# Patient Record
Sex: Female | Born: 1960 | Race: White | Hispanic: No | Smoking: Current every day smoker
Health system: Southern US, Community
[De-identification: ages and names within clinical notes are randomized; demographics above are authoritative.]

## PROBLEM LIST (undated history)

## (undated) DIAGNOSIS — I2510 Atherosclerotic heart disease of native coronary artery without angina pectoris: Secondary | ICD-10-CM

## (undated) DIAGNOSIS — A419 Sepsis, unspecified organism: Secondary | ICD-10-CM

## (undated) DIAGNOSIS — R911 Solitary pulmonary nodule: Secondary | ICD-10-CM

## (undated) DIAGNOSIS — E119 Type 2 diabetes mellitus without complications: Secondary | ICD-10-CM

## (undated) DIAGNOSIS — G894 Chronic pain syndrome: Secondary | ICD-10-CM

## (undated) DIAGNOSIS — Z720 Tobacco use: Secondary | ICD-10-CM

## (undated) DIAGNOSIS — M5412 Radiculopathy, cervical region: Secondary | ICD-10-CM

## (undated) DIAGNOSIS — Z9289 Personal history of other medical treatment: Secondary | ICD-10-CM

## (undated) DIAGNOSIS — J449 Chronic obstructive pulmonary disease, unspecified: Secondary | ICD-10-CM

## (undated) DIAGNOSIS — U071 COVID-19: Secondary | ICD-10-CM

## (undated) DIAGNOSIS — K635 Polyp of colon: Secondary | ICD-10-CM

## (undated) HISTORY — PX: CARPAL TUNNEL RELEASE: SHX101

## (undated) HISTORY — PX: DILATION AND CURETTAGE OF UTERUS: SHX78

## (undated) HISTORY — PX: TUBAL LIGATION: SHX77

---

## 2004-04-22 ENCOUNTER — Emergency Department: Payer: Self-pay | Admitting: General Practice

## 2004-06-19 ENCOUNTER — Emergency Department: Payer: Self-pay | Admitting: Emergency Medicine

## 2005-10-04 ENCOUNTER — Emergency Department: Payer: Self-pay | Admitting: Emergency Medicine

## 2006-07-16 ENCOUNTER — Emergency Department: Payer: Self-pay | Admitting: Emergency Medicine

## 2008-03-24 ENCOUNTER — Ambulatory Visit: Payer: Self-pay | Admitting: Family Medicine

## 2008-04-28 ENCOUNTER — Emergency Department: Payer: Self-pay | Admitting: Emergency Medicine

## 2008-12-04 ENCOUNTER — Emergency Department: Payer: Self-pay | Admitting: Emergency Medicine

## 2009-02-18 ENCOUNTER — Ambulatory Visit: Payer: Self-pay

## 2009-08-13 ENCOUNTER — Ambulatory Visit: Payer: Self-pay | Admitting: Unknown Physician Specialty

## 2010-10-04 DIAGNOSIS — M542 Cervicalgia: Secondary | ICD-10-CM | POA: Insufficient documentation

## 2010-10-04 DIAGNOSIS — M545 Low back pain: Secondary | ICD-10-CM | POA: Insufficient documentation

## 2010-10-18 IMAGING — CR DG LUMBAR SPINE AP/LAT/OBLIQUES W/ FLEX AND EXT
1 series · 6 of 6 positions shown · non-contrast
Comparison: none

REASON FOR EXAM: BACK PAIN
COMMENTS:

[Series 1: view not recorded · 0.17mm/px · 6 of 6 slices shown]
[im 1/6]
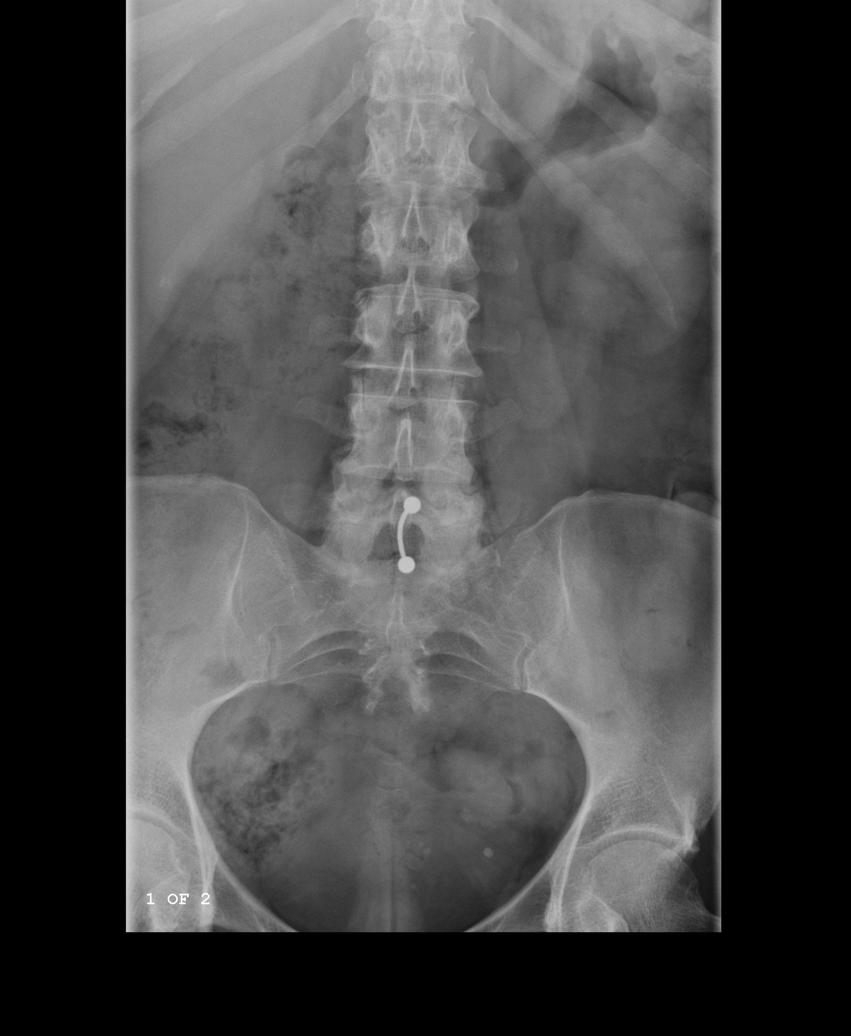
[im 2/6]
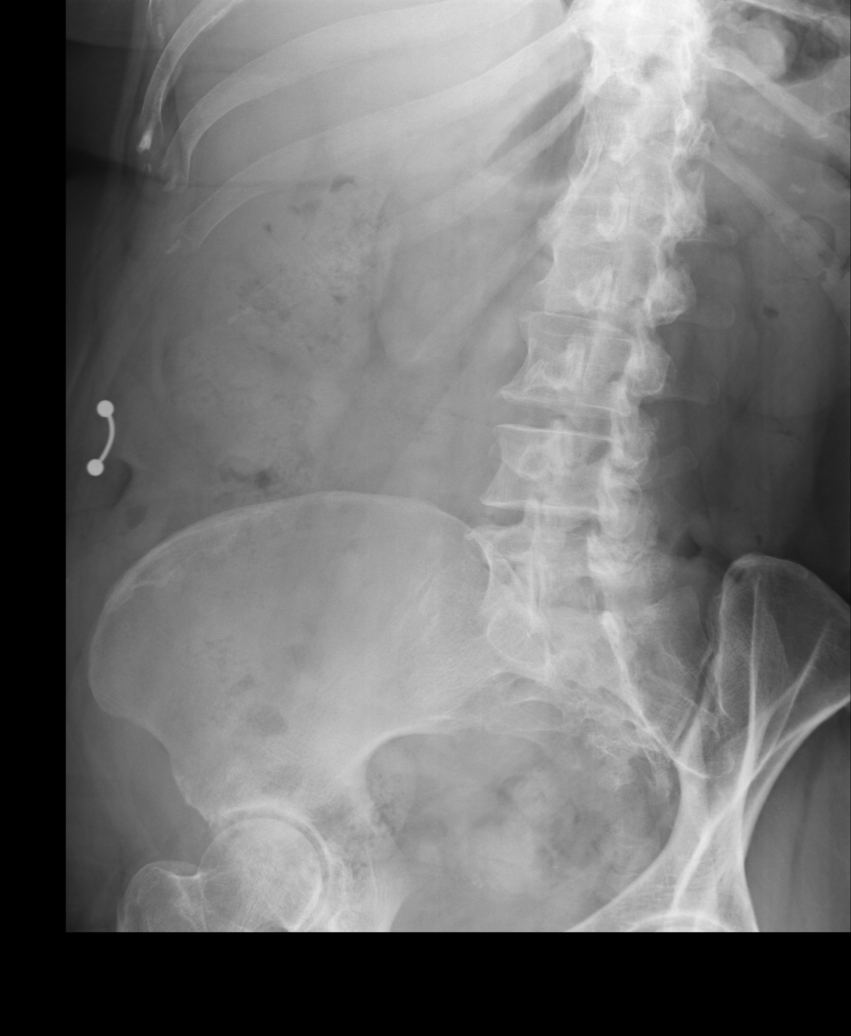
[im 3/6]
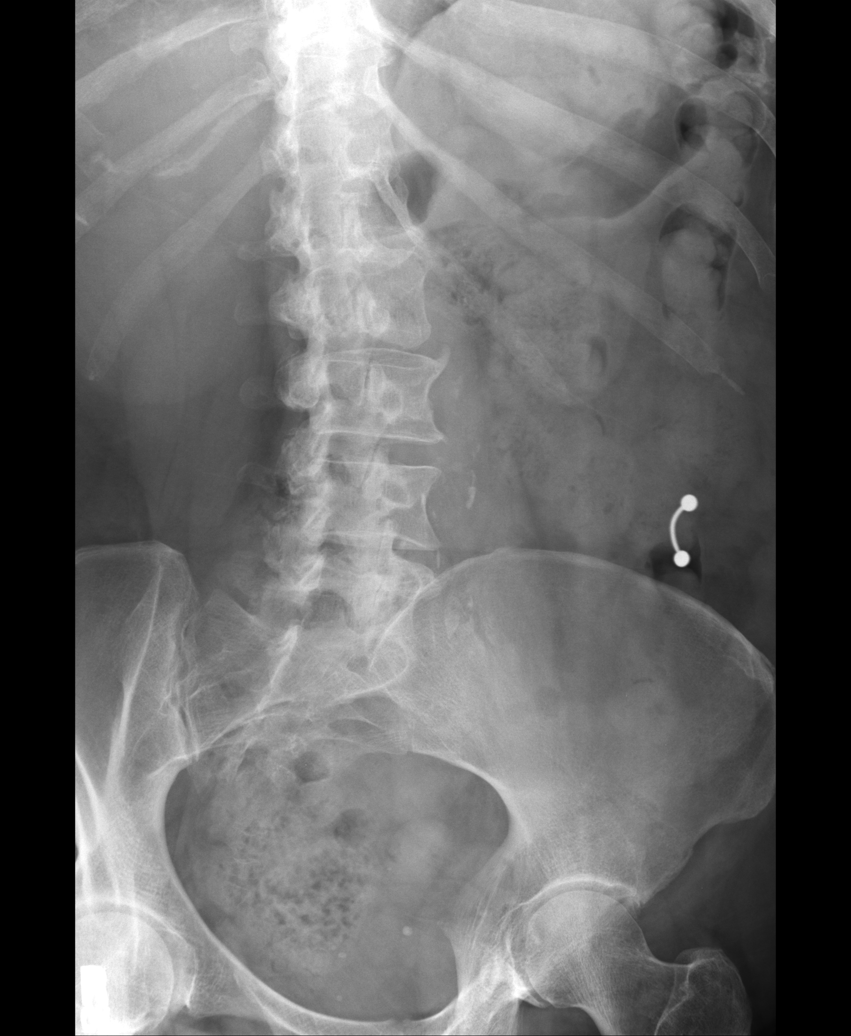
[im 4/6]
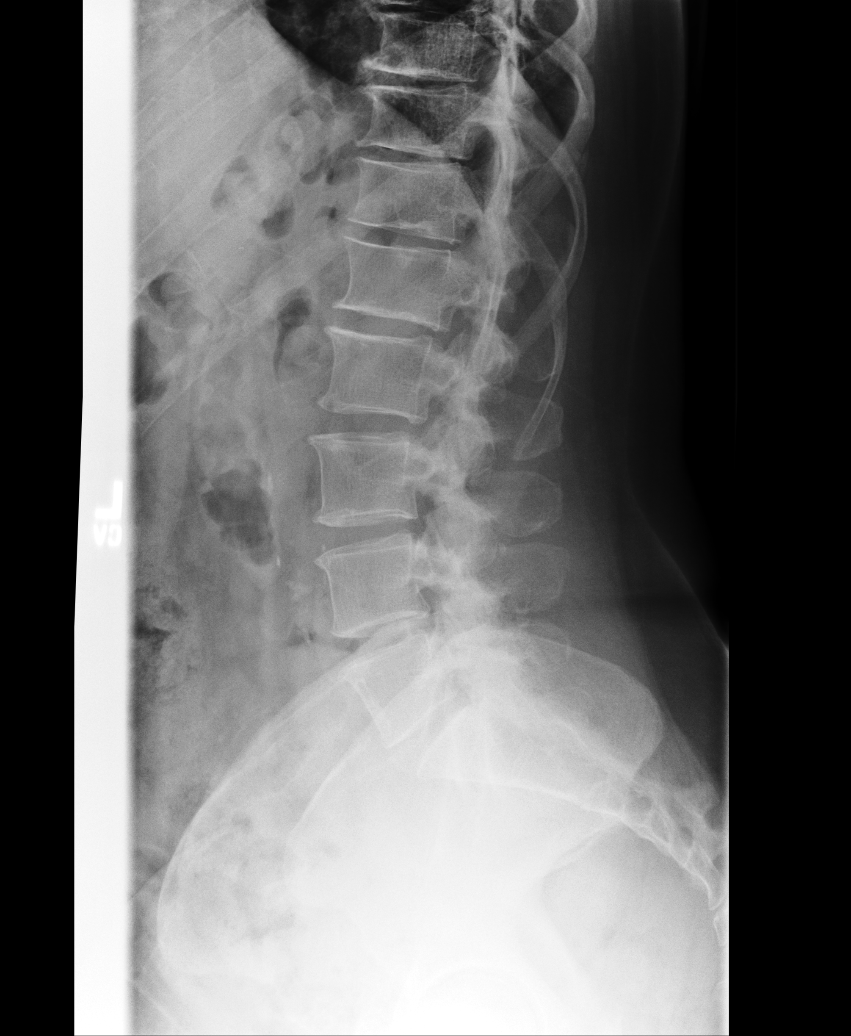
[im 5/6]
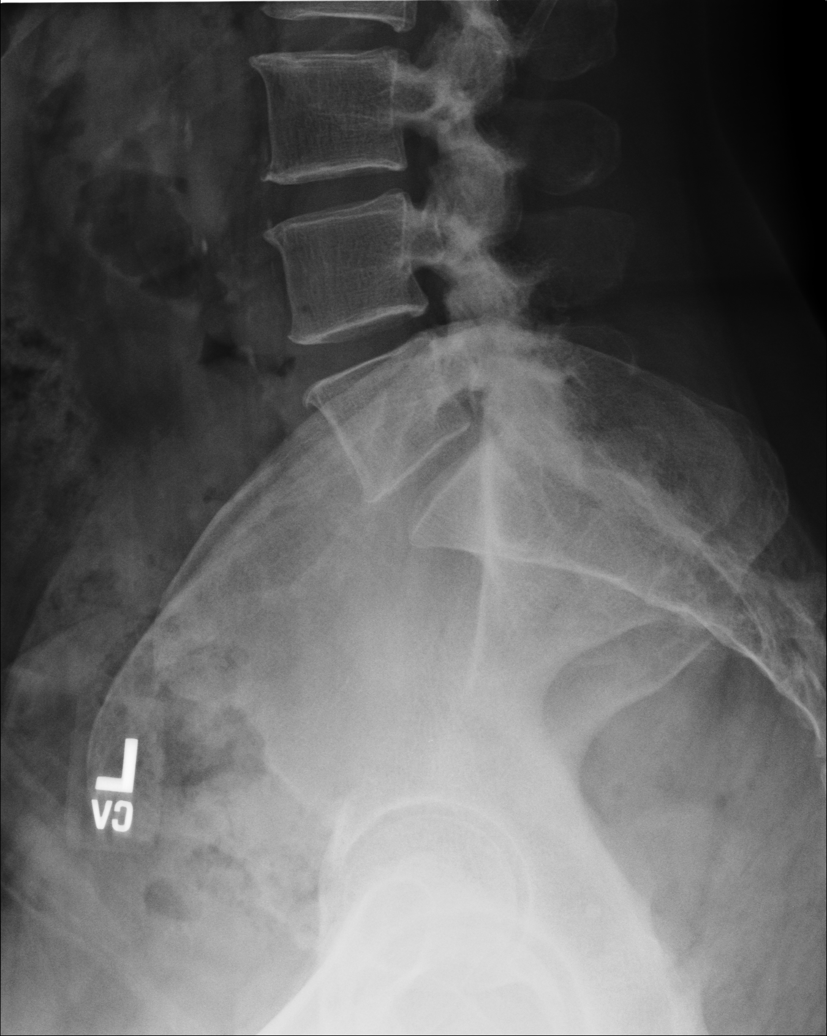
[im 6/6]
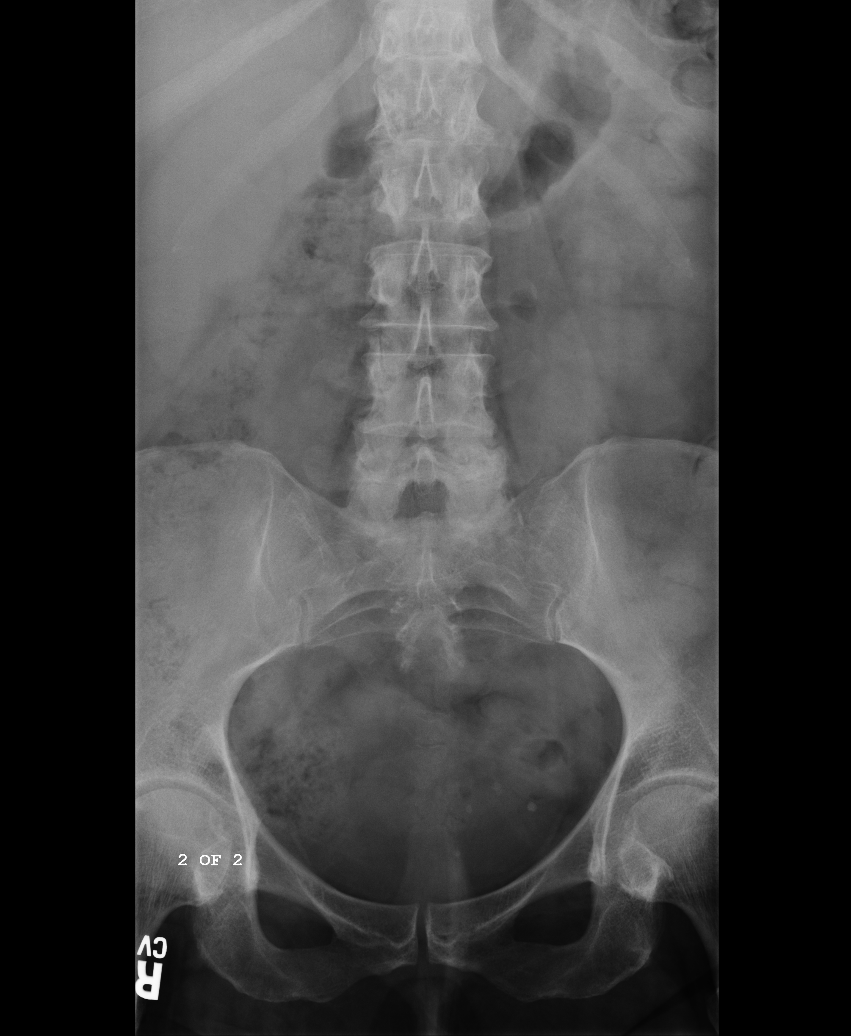

[6 of 6 positions shown; findings below may reference images not displayed]

PROCEDURE:     DXR - DXR LUMBAR SPINE WITH OBLIQUES  - March 24, 2008 [DATE]

RESULT:     There is facet hypertrophy at L4-5 and L5-S1 and to a lesser
extent at L3-4. There is no compression deformity or subluxation.
Atherosclerotic calcification is noted in the aorta. No fracture is
demonstrated. The bowel gas pattern is unremarkable.
IMPRESSION: Degenerative changes in the facets. Consider follow-up MRI
to evaluate for spinal or foraminal stenosis.

## 2012-12-04 DIAGNOSIS — Z79891 Long term (current) use of opiate analgesic: Secondary | ICD-10-CM | POA: Insufficient documentation

## 2012-12-04 DIAGNOSIS — G8929 Other chronic pain: Secondary | ICD-10-CM | POA: Insufficient documentation

## 2016-12-21 DIAGNOSIS — Z0289 Encounter for other administrative examinations: Secondary | ICD-10-CM | POA: Insufficient documentation

## 2017-10-19 ENCOUNTER — Other Ambulatory Visit: Payer: Self-pay | Admitting: Family Medicine

## 2017-10-19 ENCOUNTER — Encounter: Payer: Self-pay | Admitting: Family Medicine

## 2017-10-19 DIAGNOSIS — Z1231 Encounter for screening mammogram for malignant neoplasm of breast: Secondary | ICD-10-CM

## 2017-10-25 ENCOUNTER — Encounter: Payer: Self-pay | Admitting: *Deleted

## 2018-09-17 ENCOUNTER — Other Ambulatory Visit: Payer: Self-pay | Admitting: Pain Medicine

## 2018-09-17 DIAGNOSIS — M542 Cervicalgia: Secondary | ICD-10-CM

## 2018-09-17 DIAGNOSIS — M5412 Radiculopathy, cervical region: Secondary | ICD-10-CM

## 2018-09-28 ENCOUNTER — Other Ambulatory Visit: Payer: Self-pay

## 2018-09-28 ENCOUNTER — Ambulatory Visit
Admission: RE | Admit: 2018-09-28 | Discharge: 2018-09-28 | Disposition: A | Discharge status: Home / Self Care | Payer: Medicare HMO | Source: Ambulatory Visit | Attending: Pain Medicine | Admitting: Pain Medicine

## 2018-09-28 DIAGNOSIS — M5412 Radiculopathy, cervical region: Secondary | ICD-10-CM | POA: Insufficient documentation

## 2018-09-28 DIAGNOSIS — M542 Cervicalgia: Secondary | ICD-10-CM | POA: Insufficient documentation

## 2019-06-04 ENCOUNTER — Other Ambulatory Visit: Payer: Self-pay | Admitting: Family Medicine

## 2019-06-04 DIAGNOSIS — Z1231 Encounter for screening mammogram for malignant neoplasm of breast: Secondary | ICD-10-CM

## 2019-07-29 ENCOUNTER — Encounter: Payer: Self-pay | Admitting: *Deleted

## 2019-09-30 ENCOUNTER — Other Ambulatory Visit: Payer: Self-pay

## 2019-09-30 ENCOUNTER — Encounter: Payer: Self-pay | Admitting: Gastroenterology

## 2019-09-30 ENCOUNTER — Ambulatory Visit (INDEPENDENT_AMBULATORY_CARE_PROVIDER_SITE_OTHER): Payer: Medicare HMO | Admitting: Gastroenterology

## 2019-09-30 DIAGNOSIS — R195 Other fecal abnormalities: Secondary | ICD-10-CM

## 2019-09-30 MED ORDER — NA SULFATE-K SULFATE-MG SULF 17.5-3.13-1.6 GM/177ML PO SOLN: ORAL | 0 refills | Status: DC

## 2019-09-30 NOTE — Progress Notes (Signed)
Laurie Berg [address]  [place]  [place], [place] [address]  Main: [phone]  Fax: [phone]   Gastroenterology Consultation  Referring Provider:     Marguerita Merles, MD Primary Care Physician:  Laurie Merles, MD Reason for Consultation:     Positive stool occult blood test        HPI:    Chief Complaint  Patient presents with  . Occult blood in stool    Laurie Berg is a 59 y.o. y/o female referred for consultation & management  by Dr. Lennox Berg, Laurie Burkitt, MD.  The patient denies abdominal or flank pain, anorexia, nausea or vomiting, dysphagia, change in bowel habits or black or bloody stools or weight loss.  No family history of colon cancer.  No prior colonoscopy.  This is her first stool test for colorectal cancer screening that she has ever had done and was positive.  Past medical history: Low back pain  Prior to Admission medications   Medication Sig Start Date End Date Taking? Authorizing Provider  albuterol (VENTOLIN HFA) 108 (90 Base) MCG/ACT inhaler Inhale 1 puff into the lungs as needed. 09/11/19  Yes [provider]  Aspirin-Caffeine 845-65 MG PACK Take 1 packet by mouth 3 (three) times daily as needed. 01/31/10  Yes [provider]  cetirizine (ZYRTEC) 10 MG tablet Take 10 mg by mouth daily. 09/25/19  Yes [provider]  DULoxetine (CYMBALTA) 60 MG capsule Take 1 capsule by mouth daily. 08/26/19  Yes [provider]  metFORMIN (GLUMETZA) 500 MG (MOD) 24 hr tablet Take 500 mg by mouth daily. 06/24/19  Yes [provider]  methocarbamol (ROBAXIN) 500 MG tablet Take 1 tablet by mouth 3 (three) times daily as needed. 08/26/19  Yes [provider]  naloxegol oxalate (MOVANTIK) 25 MG TABS tablet Take 1 tablet by mouth daily. 06/26/18  Yes [provider]  oxyCODONE 30 MG 12 hr tablet Take 1 tablet by mouth every 12 (twelve) hours. 09/18/19 12/17/19 Yes [provider]   oxyCODONE-acetaminophen (PERCOCET/ROXICET) 5-325 MG tablet Take 1 tablet by mouth every 6 (six) hours as needed. 08/29/19 11/27/19 Yes [provider]  Sennosides 8.6 MG CAPS Take 1 capsule by mouth daily. 04/10/18  Yes [provider]  Na Sulfate-K Sulfate-Mg Sulf 17.5-3.13-1.6 GM/177ML SOLN At 5 PM the day before procedure take 1 bottle and 5 hours before procedure take 1 bottle. 09/30/19   Laurie Manifold, MD    History reviewed. No pertinent family history.   Social History   Tobacco Use  . Smoking status: Current Every Day Smoker    Packs/day: 1.00    Years: 48.00    Pack years: 48.00    Types: Cigarettes  . Smokeless tobacco: Never Used  Substance Use Topics  . Alcohol use: Not Currently  . Drug use: Not on file    Allergies as of 09/30/2019 - Review Complete 09/30/2019  Allergen Reaction Noted  . Pregabalin Shortness Of Breath and Swelling 05/23/2012  . Sulfamethoxazole-trimethoprim Itching 05/23/2012    Review of Systems:    All systems reviewed and negative except where noted in HPI.   Physical Exam:  There were no vitals taken for this visit. No LMP recorded. Psych:  Alert and cooperative. Normal mood and affect. General:   Alert,  Well-developed, well-nourished, pleasant and cooperative in NAD Head:  Normocephalic and atraumatic. Eyes:  Sclera clear, no icterus.   Conjunctiva pink. Ears:  Normal auditory acuity. Nose:  No  deformity, discharge, or lesions. Mouth:  No deformity or lesions,oropharynx pink & moist. Neck:  Supple; no masses or thyromegaly. Abdomen:  Normal bowel sounds.  No bruits.  Soft, non-tender and non-distended without masses, hepatosplenomegaly or hernias noted.  No guarding or rebound tenderness.    Msk:  Symmetrical without gross deformities. Good, equal movement & strength bilaterally. Pulses:  Normal pulses noted. Extremities:  No clubbing or edema.  No cyanosis. Neurologic:  Alert and oriented x3;  grossly normal  neurologically. Skin:  Intact without significant lesions or rashes. No jaundice. Lymph Nodes:  No significant cervical adenopathy. Psych:  Alert and cooperative. Normal mood and affect.   Labs: CBC No results found for: WBC, RBC, HGB, HCT, PLT, MCV, MCH, MCHC, RDW, LYMPHSABS, MONOABS, EOSABS, BASOSABS CMP  No results found for: NA, K, CL, CO2, GLUCOSE, BUN, CREATININE, CALCIUM, PROT, ALBUMIN, AST, ALT, ALKPHOS, BILITOT, GFRNONAA, GFRAA  Imaging Studies: No results found.  Assessment and Plan:   Laurie Berg is a 59 y.o. y/o female has been referred for positive stool occult blood test  Schedule for colonoscopy for rule out colon cancer given positive stool occult blood test  I have discussed alternative options, risks & benefits,  which include, but are not limited to, bleeding, infection, perforation,respiratory complication & drug reaction.  The patient agrees with this plan & written consent will be obtained.       Dr Laurie Berg  Speech recognition software was used to dictate the above note.

## 2019-10-17 ENCOUNTER — Other Ambulatory Visit
Admission: RE | Admit: 2019-10-17 | Discharge: 2019-10-17 | Disposition: A | Discharge status: Home / Self Care | Payer: Medicare HMO | Source: Ambulatory Visit | Attending: Gastroenterology | Admitting: Gastroenterology

## 2019-10-17 ENCOUNTER — Other Ambulatory Visit: Payer: Self-pay

## 2019-10-17 DIAGNOSIS — Z20822 Contact with and (suspected) exposure to covid-19: Secondary | ICD-10-CM | POA: Diagnosis not present

## 2019-10-17 DIAGNOSIS — Z01812 Encounter for preprocedural laboratory examination: Secondary | ICD-10-CM | POA: Insufficient documentation

## 2019-10-17 LAB — SARS CORONAVIRUS 2 (TAT 6-24 HRS): SARS Coronavirus 2: NEGATIVE

## 2019-10-21 ENCOUNTER — Ambulatory Visit: Payer: Medicare HMO | Admitting: Certified Registered"

## 2019-10-21 ENCOUNTER — Encounter: Payer: Self-pay | Admitting: Gastroenterology

## 2019-10-21 ENCOUNTER — Encounter
Admission: RE | Disposition: A | Discharge status: Home / Self Care | Payer: Self-pay | Source: Home / Self Care | Attending: Gastroenterology

## 2019-10-21 ENCOUNTER — Ambulatory Visit
Admission: RE | Admit: 2019-10-21 | Discharge: 2019-10-21 | Disposition: A | Discharge status: Home / Self Care | Payer: Medicare HMO | Attending: Gastroenterology | Admitting: Gastroenterology

## 2019-10-21 ENCOUNTER — Other Ambulatory Visit: Payer: Self-pay

## 2019-10-21 DIAGNOSIS — D124 Benign neoplasm of descending colon: Secondary | ICD-10-CM | POA: Diagnosis not present

## 2019-10-21 DIAGNOSIS — D122 Benign neoplasm of ascending colon: Secondary | ICD-10-CM | POA: Insufficient documentation

## 2019-10-21 DIAGNOSIS — E119 Type 2 diabetes mellitus without complications: Secondary | ICD-10-CM | POA: Insufficient documentation

## 2019-10-21 DIAGNOSIS — F17210 Nicotine dependence, cigarettes, uncomplicated: Secondary | ICD-10-CM | POA: Diagnosis not present

## 2019-10-21 DIAGNOSIS — Z7984 Long term (current) use of oral hypoglycemic drugs: Secondary | ICD-10-CM | POA: Diagnosis not present

## 2019-10-21 DIAGNOSIS — Z79891 Long term (current) use of opiate analgesic: Secondary | ICD-10-CM | POA: Insufficient documentation

## 2019-10-21 DIAGNOSIS — Z888 Allergy status to other drugs, medicaments and biological substances status: Secondary | ICD-10-CM | POA: Diagnosis not present

## 2019-10-21 DIAGNOSIS — K5730 Diverticulosis of large intestine without perforation or abscess without bleeding: Secondary | ICD-10-CM | POA: Insufficient documentation

## 2019-10-21 DIAGNOSIS — D120 Benign neoplasm of cecum: Secondary | ICD-10-CM | POA: Insufficient documentation

## 2019-10-21 DIAGNOSIS — Z882 Allergy status to sulfonamides status: Secondary | ICD-10-CM | POA: Insufficient documentation

## 2019-10-21 DIAGNOSIS — Z79899 Other long term (current) drug therapy: Secondary | ICD-10-CM | POA: Diagnosis not present

## 2019-10-21 DIAGNOSIS — K648 Other hemorrhoids: Secondary | ICD-10-CM | POA: Insufficient documentation

## 2019-10-21 DIAGNOSIS — K635 Polyp of colon: Secondary | ICD-10-CM

## 2019-10-21 DIAGNOSIS — R195 Other fecal abnormalities: Secondary | ICD-10-CM | POA: Diagnosis present

## 2019-10-21 DIAGNOSIS — J449 Chronic obstructive pulmonary disease, unspecified: Secondary | ICD-10-CM | POA: Diagnosis not present

## 2019-10-21 DIAGNOSIS — D125 Benign neoplasm of sigmoid colon: Secondary | ICD-10-CM | POA: Insufficient documentation

## 2019-10-21 HISTORY — DX: Type 2 diabetes mellitus without complications: E11.9

## 2019-10-21 HISTORY — DX: Chronic obstructive pulmonary disease, unspecified: J44.9

## 2019-10-21 HISTORY — PX: COLONOSCOPY WITH PROPOFOL: SHX5780

## 2019-10-21 LAB — GLUCOSE, CAPILLARY: Glucose-Capillary: 131 mg/dL — ABNORMAL HIGH (ref 70–99)

## 2019-10-21 SURGERY — COLONOSCOPY WITH PROPOFOL
Anesthesia: General

## 2019-10-21 MED ORDER — ESMOLOL HCL 100 MG/10ML IV SOLN
INTRAVENOUS | Status: DC | PRN
  Administered 2019-10-21: 10 ug via INTRAVENOUS

## 2019-10-21 MED ORDER — SODIUM CHLORIDE 0.9 % IV SOLN: INTRAVENOUS | Status: DC

## 2019-10-21 MED ORDER — GLYCOPYRROLATE 0.2 MG/ML IJ SOLN
INTRAMUSCULAR | Status: DC | PRN
  Administered 2019-10-21: .2 mg via INTRAVENOUS

## 2019-10-21 MED ORDER — PROPOFOL 10 MG/ML IV BOLUS
INTRAVENOUS | Status: DC | PRN
  Administered 2019-10-21: 10 mg via INTRAVENOUS
  Administered 2019-10-21: 50 mg via INTRAVENOUS

## 2019-10-21 MED ORDER — PROPOFOL 500 MG/50ML IV EMUL
INTRAVENOUS | Status: DC | PRN
  Administered 2019-10-21: 155 ug/kg/min via INTRAVENOUS

## 2019-10-21 MED ORDER — LIDOCAINE HCL (CARDIAC) PF 100 MG/5ML IV SOSY
PREFILLED_SYRINGE | INTRAVENOUS | Status: DC | PRN
  Administered 2019-10-21: 100 mg via INTRAVENOUS

## 2019-10-21 NOTE — H&P (Signed)
Vonda Antigua, MD [address], [place], [place], [place], [address] [address], [place], [place], [place], [address] Phone: [phone]  Fax: [phone]  Primary Care Physician:  Marguerita Merles, MD   Pre-Procedure History & Physical: HPI:  Laurie Berg is a 59 y.o. female is here for a colonoscopy.   Past Medical History:  Diagnosis Date  . COPD (chronic obstructive pulmonary disease) ([place])   . Diabetes mellitus without complication ([place])     History reviewed. No pertinent surgical history.  Prior to Admission medications   Medication Sig Start Date End Date Taking? Authorizing Provider  albuterol (VENTOLIN HFA) 108 (90 Base) MCG/ACT inhaler Inhale 1 puff into the lungs as needed. 09/11/19  Yes [provider]  Aspirin-Caffeine 845-65 MG PACK Take 1 packet by mouth 3 (three) times daily as needed. 01/31/10  Yes [provider]  cetirizine (ZYRTEC) 10 MG tablet Take 10 mg by mouth daily. 09/25/19  Yes [provider]  DULoxetine (CYMBALTA) 60 MG capsule Take 1 capsule by mouth daily. 08/26/19  Yes [provider]  metFORMIN (GLUMETZA) 500 MG (MOD) 24 hr tablet Take 500 mg by mouth daily. 06/24/19  Yes [provider]  methocarbamol (ROBAXIN) 500 MG tablet Take 1 tablet by mouth 3 (three) times daily as needed. 08/26/19  Yes [provider]  naloxegol oxalate (MOVANTIK) 25 MG TABS tablet Take 1 tablet by mouth daily. 06/26/18  Yes [provider]  oxyCODONE 30 MG 12 hr tablet Take 1 tablet by mouth every 12 (twelve) hours. 09/18/19 12/17/19 Yes [provider]  oxyCODONE-acetaminophen (PERCOCET/ROXICET) 5-325 MG tablet Take 1 tablet by mouth every 6 (six) hours as needed. 08/29/19 11/27/19 Yes [provider]  Sennosides 8.6 MG CAPS Take 1 capsule by mouth daily. 04/10/18  Yes [provider]  Na Sulfate-K Sulfate-Mg Sulf 17.5-3.13-1.6 GM/177ML SOLN At 5 PM the day before procedure take 1  bottle and 5 hours before procedure take 1 bottle. 09/30/19   Virgel Manifold, MD    Allergies as of 09/30/2019 - Review Complete 09/30/2019  Allergen Reaction Noted  . Pregabalin Shortness Of Breath and Swelling 05/23/2012  . Sulfamethoxazole-trimethoprim Itching 05/23/2012    History reviewed. No pertinent family history.  Social History   Socioeconomic History  . Marital status: Divorced    Spouse name: Not on file  . Number of children: Not on file  . Years of education: Not on file  . Highest education level: Not on file  Occupational History  . Not on file  Tobacco Use  . Smoking status: Current Every Day Smoker    Packs/day: 1.00    Years: 48.00    Pack years: 48.00    Types: Cigarettes  . Smokeless tobacco: Never Used  Vaping Use  . Vaping Use: Never used  Substance and Sexual Activity  . Alcohol use: Not Currently  . Drug use: Never  . Sexual activity: Not on file  Other Topics Concern  . Not on file  Social History Narrative  . Not on file   Social Determinants of Health   Financial Resource Strain:   . Difficulty of Paying Living Expenses: Not on file  Food Insecurity:   . Worried About Charity fundraiser in the Last Year: Not on file  . Ran Out of Food in the Last Year: Not on file  Transportation Needs:   . Lack of Transportation (Medical): Not on file  . Lack of Transportation (Non-Medical): Not on file  Physical Activity:   . Days of Exercise per Week: Not on file  . Minutes of Exercise per Session: Not on file  Stress:   . Feeling of Stress : Not on file  Social Connections:   . Frequency of Communication with Friends and Family: Not on file  . Frequency of Social Gatherings with Friends and Family: Not on file  . Attends Religious Services: Not on file  . Active Member of Clubs or Organizations: Not on file  . Attends Archivist Meetings: Not on file  . Marital Status: Not on file  Intimate Partner Violence:   . Fear of  Current or Ex-Partner: Not on file  . Emotionally Abused: Not on file  . Physically Abused: Not on file  . Sexually Abused: Not on file    Review of Systems: See HPI, otherwise negative ROS  Physical Exam: BP 110/89   Pulse 100   Temp (!) 96.4 F (35.8 C) (Temporal)   Resp 17   Ht 5\' 4"  (1.626 m)   Wt 80.7 kg   SpO2 94%   BMI 30.55 kg/m  General:   Alert,  pleasant and cooperative in NAD Head:  Normocephalic and atraumatic. Neck:  Supple; no masses or thyromegaly. Lungs:  Clear throughout to auscultation, normal respiratory effort.    Heart:  +S1, +S2, Regular rate and rhythm, No edema. Abdomen:  Soft, nontender and nondistended. Normal bowel sounds, without guarding, and without rebound.   Neurologic:  Alert and  oriented x4;  grossly normal neurologically.  Impression/Plan: Laurie Berg is here for a colonoscopy to be performed for positive stool occult test.  Risks, benefits, limitations, and alternatives regarding  colonoscopy have been reviewed with the patient.  Questions have been answered.  All parties agreeable.   Virgel Manifold, MD  10/21/2019, 10:05 AM

## 2019-10-21 NOTE — Anesthesia Procedure Notes (Signed)
Procedure Name: General with mask airway Performed by: Fletcher-Harrison, Euclide Granito, CRNA Pre-anesthesia Checklist: Patient identified, Emergency Drugs available, Suction available and Patient being monitored Patient Re-evaluated:Patient Re-evaluated prior to induction Oxygen Delivery Method: Simple face mask Induction Type: IV induction Placement Confirmation: positive ETCO2 and CO2 detector Dental Injury: Teeth and Oropharynx as per pre-operative assessment        

## 2019-10-21 NOTE — Anesthesia Preprocedure Evaluation (Signed)
Anesthesia Evaluation  Patient identified by MRN, date of birth, ID band Patient awake    Reviewed: Allergy & Precautions, NPO status , Patient's Chart, lab work & pertinent test results  History of Anesthesia Complications Negative for: history of anesthetic complications  Airway Mallampati: III       Dental   Pulmonary neg sleep apnea, COPD,  COPD inhaler, Current Smoker and Patient abstained from smoking.,           Cardiovascular (-) hypertension(-) Past MI and (-) CHF (-) dysrhythmias (-) Valvular Problems/Murmurs     Neuro/Psych neg Seizures    GI/Hepatic Neg liver ROS, neg GERD  ,  Endo/Other  diabetes, Type 2, Oral Hypoglycemic Agents  Renal/GU negative Renal ROS     Musculoskeletal   Abdominal   Peds  Hematology   Anesthesia Other Findings   Reproductive/Obstetrics                             Anesthesia Physical Anesthesia Plan  ASA: III  Anesthesia Plan: General   Post-op Pain Management:    Induction: Intravenous  PONV Risk Score and Plan: 2 and Propofol infusion and TIVA  Airway Management Planned: Nasal Cannula  Additional Equipment:   Intra-op Plan:   Post-operative Plan:   Informed Consent: I have reviewed the patients History and Physical, chart, labs and discussed the procedure including the risks, benefits and alternatives for the proposed anesthesia with the patient or authorized representative who has indicated his/her understanding and acceptance.       Plan Discussed with:   Anesthesia Plan Comments:         Anesthesia Quick Evaluation

## 2019-10-21 NOTE — Anesthesia Postprocedure Evaluation (Signed)
Anesthesia Post Note  Patient: Laurie Berg  Procedure(s) Performed: COLONOSCOPY WITH PROPOFOL (N/A )  Patient location during evaluation: Endoscopy Anesthesia Type: General Level of consciousness: awake and alert Pain management: pain level controlled Vital Signs Assessment: post-procedure vital signs reviewed and stable Respiratory status: spontaneous breathing and respiratory function stable Cardiovascular status: stable Anesthetic complications: no   No complications documented.   Last Vitals:  Vitals:   10/21/19 0944 10/21/19 1124  BP: 110/89 (!) 173/93  Pulse: 100   Resp: 17   Temp: (!) 35.8 C   SpO2: 94%     Last Pain:  Vitals:   10/21/19 1124  TempSrc:   PainSc: 0-No pain                 Cherylene Ferrufino K

## 2019-10-21 NOTE — Transfer of Care (Signed)
Immediate Anesthesia Transfer of Care Note  Patient: Laurie Berg  Procedure(s) Performed: COLONOSCOPY WITH PROPOFOL (N/A )  Patient Location: Endoscopy Unit  Anesthesia Type:General  Level of Consciousness: drowsy and patient cooperative  Airway & Oxygen Therapy: Patient Spontanous Breathing and Patient connected to face mask oxygen  Post-op Assessment: Report given to RN and Post -op Vital signs reviewed and stable  Post vital signs: Reviewed and stable  Last Vitals:  Vitals Value Taken Time  BP 131/98 10/21/19 1104  Temp    Pulse 102 10/21/19 1105  Resp 17 10/21/19 1105  SpO2 99 % 10/21/19 1105  Vitals shown include unvalidated device data.  Last Pain:  Vitals:   10/21/19 1104  TempSrc:   PainSc: 0-No pain         Complications: No complications documented.

## 2019-10-21 NOTE — Op Note (Signed)
[facility] Patient Name: Laurie Berg Procedure Date: 10/21/2019 10:04 AM MRN: [id number] Account #: [id number] Date of Birth: September 27, 1960 Admit Type: Outpatient Age: 59 Room: [facility] ENDO ROOM 1 Gender: Female Note Status: Finalized Procedure:             Colonoscopy Indications:           Gastrointestinal occult blood loss Providers:             Lannette Avellino B. Bonna Gains MD, MD Referring MD:          Marguerita Merles, MD (Referring MD) Medicines:             Monitored Anesthesia Care Complications:         No immediate complications. Procedure:             Pre-Anesthesia Assessment:                        - ASA Grade Assessment: II - A patient with mild                         systemic disease.                        - Prior to the procedure, a History and Physical was                         performed, and patient medications, allergies and                         sensitivities were reviewed. The patient's tolerance                         of previous anesthesia was reviewed.                        - The risks and benefits of the procedure and the                         sedation options and risks were discussed with the                         patient. All questions were answered and informed                         consent was obtained.                        - Patient identification and proposed procedure were                         verified prior to the procedure by the physician, the                         nurse, the anesthesiologist, the anesthetist and the                         technician. The procedure was verified in the                         procedure room.  After obtaining informed consent, the colonoscope was                         passed under direct vision. Throughout the procedure,                         the patient's blood pressure, pulse, and oxygen                         saturations were monitored  continuously. The                         Colonoscope was introduced through the anus and                         advanced to the the cecum, identified by appendiceal                         orifice and ileocecal valve. The colonoscopy was                         performed with ease. The patient tolerated the                         procedure well. The quality of the bowel preparation                         was good. Findings:      Hemorrhoids were found on perianal exam.      Three sessile polyps were found in the ascending colon and cecum. The       polyps were 5 to 8 mm in size. These polyps were removed with a cold       snare. Resection and retrieval were complete.      A 4 mm polyp was found in the ascending colon. The polyp was sessile.       The polyp was removed with a cold biopsy forceps. Resection and       retrieval were complete.      An area of mildly thickened folds of the mucosa was found at the       ileocecal valve. Biopsies were taken with a cold forceps for histology.      Three sessile polyps were found in the descending colon. The polyps were       5 to 7 mm in size. These polyps were removed with a cold snare.       Resection and retrieval were complete.      A 12 mm polyp was found in the sigmoid colon. The polyp was       semi-pedunculated. The polyp was removed with a hot snare. Resection and       retrieval were complete.      Multiple diverticula were found in the sigmoid colon.      The exam was otherwise without abnormality.      The rectum, sigmoid colon, descending colon, transverse colon, ascending       colon and cecum appeared normal.      Non-bleeding internal hemorrhoids were found during retroflexion. The       hemorrhoids were medium-sized. Impression:            -  Hemorrhoids found on perianal exam.                        - Three 5 to 8 mm polyps in the ascending colon and in                         the cecum, removed with a cold snare. Resected  and                         retrieved.                        - One 4 mm polyp in the ascending colon, removed with                         a cold biopsy forceps. Resected and retrieved.                        - Thickened folds of the mucosa at the ileocecal                         valve. Biopsied.                        - Three 5 to 7 mm polyps in the descending colon,                         removed with a cold snare. Resected and retrieved.                        - One 12 mm polyp in the sigmoid colon, removed with a                         hot snare. Resected and retrieved.                        - Diverticulosis in the sigmoid colon.                        - The examination was otherwise normal.                        - The rectum, sigmoid colon, descending colon,                         transverse colon, ascending colon and cecum are normal.                        - Non-bleeding internal hemorrhoids. Recommendation:        - Discharge patient to home (with escort).                        - High fiber diet.                        - Advance diet as tolerated.                        - Continue present medications.                        -  Await pathology results.                        - Repeat colonoscopy date to be determined after                         pending pathology results are reviewed.                        - The findings and recommendations were discussed with                         the patient.                        - The findings and recommendations were discussed with                         the patient's family.                        - Return to primary care physician as previously                         scheduled.                        - High fiber diet. Procedure Code(s):     --- Professional ---                        [phone], Colonoscopy, flexible; with removal of                         tumor(s), polyp(s), or other lesion(s) by snare                          technique                        45380, 64, Colonoscopy, flexible; with biopsy, single                         or multiple Diagnosis Code(s):     --- Professional ---                        K63.5, Polyp of colon                        R19.5, Other fecal abnormalities CPT copyright 2019 American Medical Association. All rights reserved. The codes documented in this report are preliminary and upon coder review may  be revised to meet current compliance requirements.  Vonda Antigua, MD Margretta Sidle B. Bonna Gains MD, MD 10/21/2019 11:07:01 AM This report has been signed electronically. Number of Addenda: 0 Note Initiated On: 10/21/2019 10:04 AM Scope Withdrawal Time: 0 hours 32 minutes 2 seconds  Estimated Blood Loss:  Estimated blood loss: none.      [facility]

## 2019-10-22 ENCOUNTER — Encounter: Payer: Self-pay | Admitting: Gastroenterology

## 2019-10-22 LAB — SURGICAL PATHOLOGY

## 2019-10-23 ENCOUNTER — Encounter: Payer: Self-pay | Admitting: Gastroenterology

## 2019-10-31 ENCOUNTER — Telehealth: Payer: Self-pay

## 2019-10-31 NOTE — Telephone Encounter (Signed)
Patient called requesting for her colonoscopy results. I then called her back and had to leave her a detailed message letting her know that the polyps that were removed were pre-cancerous, therefore, Dr. Bonna Gains would want to repeat her colonoscopy in a year and that we would put her on our recall list to contact her.

## 2021-04-11 ENCOUNTER — Other Ambulatory Visit: Payer: Self-pay

## 2021-04-11 ENCOUNTER — Telehealth: Payer: Self-pay | Admitting: Family Medicine

## 2021-04-11 NOTE — Telephone Encounter (Signed)
Requesting a call back from [facility]

## 2021-04-12 ENCOUNTER — Telehealth: Payer: Self-pay

## 2021-04-12 NOTE — Telephone Encounter (Signed)
CALLED PATIENT NO ANSWER LEFT VOICEMAIL FOR A CALL BACK ? ?

## 2021-04-12 NOTE — Progress Notes (Signed)
Patient would like to schedule procedure at a later time. Will contact office.

## 2021-04-18 ENCOUNTER — Other Ambulatory Visit: Payer: Self-pay | Admitting: *Deleted

## 2021-04-18 DIAGNOSIS — Z87891 Personal history of nicotine dependence: Secondary | ICD-10-CM

## 2021-04-18 DIAGNOSIS — F17210 Nicotine dependence, cigarettes, uncomplicated: Secondary | ICD-10-CM

## 2021-04-27 ENCOUNTER — Ambulatory Visit (INDEPENDENT_AMBULATORY_CARE_PROVIDER_SITE_OTHER): Payer: Medicare HMO | Admitting: Acute Care

## 2021-04-27 ENCOUNTER — Encounter: Payer: Self-pay | Admitting: Acute Care

## 2021-04-27 ENCOUNTER — Other Ambulatory Visit: Payer: Self-pay

## 2021-04-27 DIAGNOSIS — F17210 Nicotine dependence, cigarettes, uncomplicated: Secondary | ICD-10-CM | POA: Diagnosis not present

## 2021-04-27 NOTE — Patient Instructions (Signed)
Thank you for participating in the [place] [facility] Screening Program. °It was our pleasure to meet you today. °We will call you with the results of your scan within the next few days. °Your scan will be assigned a Lung RADS category score by the physicians reading the scans.  °This Lung RADS score determines follow up scanning.  °See below for description of categories, and follow up screening recommendations. °We will be in touch to schedule your follow up screening annually or based on recommendations of our providers. °We will fax a copy of your scan results to your Primary Care Physician, or the physician who referred you to the program, to ensure they have the results. °Please call the office if you have any questions or concerns regarding your scanning experience or results.  °Our office number is [phone]. °Please speak with Denise Phelps, RN. She is our Lung Cancer Screening RN. °If she is unavailable when you call, please have the office staff send her a message. She will return your call at her earliest convenience. °Remember, if your scan is normal, we will scan you annually as long as you continue to meet the criteria for the program. (Age 55-77, Current smoker or smoker who has quit within the last 15 years). °If you are a smoker, remember, quitting is the single most powerful action that you can take to decrease your risk of lung cancer and other pulmonary, breathing related problems. °We know quitting is hard, and we are here to help.  °Please let us know if there is anything we can do to help you meet your goal of quitting. °If you are a former smoker, congratulations. We are proud of you! Remain smoke free! °Remember you can refer friends or family members through the number above.  °We will screen them to make sure they meet criteria for the program. °Thank you for helping us take better care of you by participating in Lung Screening. ° °You can receive free nicotine replacement therapy  ( patches, gum or mints) by calling 1-800-QUIT NOW. Please call so we can get you on the path to becoming  a non-smoker. I know it is hard, but you can do this! ° °Lung RADS Categories: ° °Lung RADS 1: no nodules or definitely non-concerning nodules.  °Recommendation is for a repeat annual scan in 12 months. ° °Lung RADS 2:  nodules that are non-concerning in appearance and behavior with a very low likelihood of becoming an active cancer. °Recommendation is for a repeat annual scan in 12 months. ° °Lung RADS 3: nodules that are probably non-concerning , includes nodules with a low likelihood of becoming an active cancer.  Recommendation is for a 6-month repeat screening scan. Often noted after an upper respiratory illness. We will be in touch to make sure you have no questions, and to schedule your 6-month scan. ° °Lung RADS 4 A: nodules with concerning findings, recommendation is most often for a follow up scan in 3 months or additional testing based on our provider's assessment of the scan. We will be in touch to make sure you have no questions and to schedule the recommended 3 month follow up scan. ° °Lung RADS 4 B:  indicates findings that are concerning. We will be in touch with you to schedule additional diagnostic testing based on our provider's  assessment of the scan. ° °Hypnosis for smoking cessation  °[employer] °[phone] ° °Acupuncture for smoking cessation  °[facility] °[phone]  °

## 2021-04-27 NOTE — Progress Notes (Signed)
Virtual Visit via Video Note ? ?I connected with Laurie Berg on 04/27/21 at 11:00 AM EDT by a video enabled telemedicine application and verified that I am speaking with the correct person using two identifiers. ? ?Location: ?Patient:  At home ?Provider: [place], [place], [place], [address]  ?  ?I discussed the limitations of evaluation and management by telemedicine and the availability of in person appointments. The patient expressed understanding and agreed to proceed. ? ? ?Shared Decision Making Visit Lung Cancer Screening Program ?([phone]) ? ? ?Eligibility: ?Age 61 y.o. ?Pack Years Smoking History Calculation 60 pack year smoking history ?(# packs/per year x # years smoked) ?Recent History of coughing up blood  no ?Unexplained weight loss? no ?( >Than 15 pounds within the last 6 months ) ?Prior History Lung / other cancer no ?(Diagnosis within the last 5 years already requiring surveillance chest CT Scans). ?Smoking Status Current Smoker ?Former Smokers: Years since quit:  NA ? Quit Date:  NA ? ?Visit Components: ?Discussion included one or more decision making aids. yes ?Discussion included risk/benefits of screening. yes ?Discussion included potential follow up diagnostic testing for abnormal scans. yes ?Discussion included meaning and risk of over diagnosis. yes ?Discussion included meaning and risk of False Positives. yes ?Discussion included meaning of total radiation exposure. yes ? ?Counseling Included: ?Importance of adherence to annual lung cancer LDCT screening. yes ?Impact of comorbidities on ability to participate in the program. yes ?Ability and willingness to under diagnostic treatment. yes ? ?Smoking Cessation Counseling: ?Current Smokers:  ?Discussed importance of smoking cessation. yes ?Information about tobacco cessation classes and interventions provided to patient. yes ?Patient provided with "ticket" for LDCT Scan. yes ?Symptomatic Patient. no ? Counseling NA ?Diagnosis Code:  Tobacco Use Z72.0 ?Asymptomatic Patient yes ? Counseling (Intermediate counseling: > three minutes counseling) L2440 ?Former Smokers:  ?Discussed the importance of maintaining cigarette abstinence. yes ?Diagnosis Code: Personal History of Nicotine Dependence. N02.725 ?Information about tobacco cessation classes and interventions provided to patient. Yes ?Patient provided with "ticket" for LDCT Scan. yes ?Written Order for Lung Cancer Screening with LDCT placed in Epic. Yes ?(CT Chest Lung Cancer Screening Low Dose W/O CM) [id number] ?Z12.2-Screening of respiratory organs ?Z87.891-Personal history of nicotine dependence ? ?I have spent 25 minutes of face to face/ virtual visit   time with  Laurie Berg  discussing the risks and benefits of lung cancer screening. We viewed / discussed a power point together that explained in detail the above noted topics. We paused at intervals to allow for questions to be asked and answered to ensure understanding.We discussed that the single most powerful action that she can take to decrease her risk of developing lung cancer is to quit smoking. We discussed whether or not she is ready to commit to setting a quit date. We discussed options for tools to aid in quitting smoking including nicotine replacement therapy, non-nicotine medications, support groups, Quit Smart classes, and behavior modification. We discussed that often times setting smaller, more achievable goals, such as eliminating 1 cigarette a day for a week and then 2 cigarettes a day for a week can be helpful in slowly decreasing the number of cigarettes smoked. This allows for a sense of accomplishment as well as providing a clinical benefit. I provided  her  with smoking cessation  information  with contact information for community resources, classes, free nicotine replacement therapy, and access to mobile apps, text messaging, and on-line smoking cessation help. I have also provided  her  the office contact information  in the event she needs to contact me, or the screening staff. We discussed the time and location of the scan, and that either Doroteo Glassman RN, Joella Prince, RN  or I will call / send a letter with the results within 24-72 hours of receiving them. The patient verbalized understanding of all of  the above and had no further questions upon leaving the office. They have my contact information in the event they have any further questions. ? ?I spent 3 minutes counseling on smoking cessation and the health risks of continued tobacco abuse. ? ?I explained to the patient that there has been a high incidence of coronary artery disease noted on these exams. I explained that this is a non-gated exam therefore degree or severity cannot be determined. This patient is not on statin therapy. I have asked the patient to follow-up with their PCP regarding any incidental finding of coronary artery disease and management with diet or medication as their PCP  feels is clinically indicated. The patient verbalized understanding of the above and had no further questions upon completion of the visit. ? ?  ? ? ?Magdalen Spatz, NP ? ?04/27/2021 ? ? ? ? ? ?

## 2021-04-28 ENCOUNTER — Other Ambulatory Visit: Payer: Self-pay

## 2021-04-28 ENCOUNTER — Ambulatory Visit
Admission: RE | Admit: 2021-04-28 | Discharge: 2021-04-28 | Disposition: A | Discharge status: Home / Self Care | Payer: Medicare HMO | Source: Ambulatory Visit | Attending: Acute Care | Admitting: Acute Care

## 2021-04-28 DIAGNOSIS — Z87891 Personal history of nicotine dependence: Secondary | ICD-10-CM | POA: Insufficient documentation

## 2021-04-28 DIAGNOSIS — F17210 Nicotine dependence, cigarettes, uncomplicated: Secondary | ICD-10-CM | POA: Diagnosis not present

## 2021-05-02 ENCOUNTER — Other Ambulatory Visit: Payer: Self-pay

## 2021-05-02 DIAGNOSIS — F17210 Nicotine dependence, cigarettes, uncomplicated: Secondary | ICD-10-CM

## 2021-05-02 DIAGNOSIS — Z87891 Personal history of nicotine dependence: Secondary | ICD-10-CM

## 2021-05-05 ENCOUNTER — Telehealth: Payer: Self-pay | Admitting: Acute Care

## 2021-05-05 NOTE — Telephone Encounter (Signed)
Please call patient and let her know her scan was read as a Lung RADS 2: nodules that are benign in appearance and behavior with a very low likelihood of becoming a clinically active cancer due to size or lack of growth. Recommendation per radiology is for a repeat LDCT in 12 months.  ?Please let her know there was notation of adrenal adenomas . These are non-cancerous tumors that form within the adrenal gland, which is the hormone producing gland located on top of each kidney.  Symptomatic Adrenal adenomas can cause increased levels of fat in the torso, obesity, fatigue, muscle weakness , Hypertension, diabetes and bruising. Not all adenomas cause symptoms. Please follow up with PCP for further evaluation as they feel is clinically indicated.  ? ?Langley Gauss, please fax results to PCP and let them know about the finding of the adrenal adenomas so they can follow up as the feel is clinically indicated. ?Thanks so much ?

## 2021-05-05 NOTE — Telephone Encounter (Signed)
Spoke with patient to review LDCT results.  Advised of no suspicious nodules for lung cancer, atherosclerosis and emphysema are noted.   ?

## 2021-05-05 NOTE — Telephone Encounter (Signed)
Unable to reach patient by phone to review LCS results.  Left voicemail with call back number.   ?Results of recent CT chest faxed to PCP for review. ?

## 2021-05-05 NOTE — Telephone Encounter (Signed)
Results of LDCT continued - also noted were adrenal adenomas.  Discussed per Eric Form, NP notes.  Patient acknowledged understanding . Will place order for annual LDCT and route report to PCP for review.  Patient had no further questions ?

## 2021-07-20 ENCOUNTER — Telehealth: Payer: Self-pay

## 2021-07-20 NOTE — Telephone Encounter (Signed)
Patient is following up. She states she has never seen a cardiologist in the past.

## 2021-07-20 NOTE — Telephone Encounter (Signed)
Left message on voicemail requesting to have patient call back if she has any previous cardiac hx so we could get any pertinent records prior to upcoming appointment with Dr. Garen Lah on 07/22/2021.

## 2021-07-22 ENCOUNTER — Encounter: Payer: Self-pay | Admitting: Cardiology

## 2021-07-22 ENCOUNTER — Ambulatory Visit (INDEPENDENT_AMBULATORY_CARE_PROVIDER_SITE_OTHER): Payer: Medicare HMO | Admitting: Cardiology

## 2021-07-22 VITALS — BP 130/84 | HR 109 | Ht 64.0 in | Wt 175.0 lb

## 2021-07-22 DIAGNOSIS — R0609 Other forms of dyspnea: Secondary | ICD-10-CM

## 2021-07-22 DIAGNOSIS — Z13220 Encounter for screening for lipoid disorders: Secondary | ICD-10-CM | POA: Diagnosis not present

## 2021-07-22 DIAGNOSIS — I2510 Atherosclerotic heart disease of native coronary artery without angina pectoris: Secondary | ICD-10-CM

## 2021-07-22 DIAGNOSIS — F17200 Nicotine dependence, unspecified, uncomplicated: Secondary | ICD-10-CM | POA: Diagnosis not present

## 2021-07-22 NOTE — Progress Notes (Signed)
Cardiology Office Note:    Date:  07/22/2021   ID:  Laurie Berg, DOB [birth year], MRN [id number]  PCP:  Marguerita Merles, MD   [facility] Cardiologist:  Kate Sable, MD     Referring MD: Marguerita Merles, MD   Chief Complaint  Patient presents with   NEW patient-Referred by PCP for eval of CAD    Patient reports DOE, intermittent chest pain, and palpitations.    History of Present Illness:    Laurie Berg is a 61 y.o. female with a hx of diabetes, current smoker x40+ years, COPD who presents due to coronary artery calcifications, shortness of breath with exertion and chest pain.   She denies shortness of breath with minimal exertion which she attributes to COPD. Chest CT lung cancer screening was obtained 04/28/2021, three-vessel coronary artery calcification was noted.  Endorses occasional chest pain not associated with exertion.  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  She still smokes.  Past Medical History:  Diagnosis Date   COPD (chronic obstructive pulmonary disease) ([place])    Diabetes mellitus without complication ([place])     Past Surgical History:  Procedure Laterality Date   CARPAL TUNNEL RELEASE     COLONOSCOPY WITH PROPOFOL N/A 10/21/2019   Procedure: COLONOSCOPY WITH PROPOFOL;  Surgeon: Virgel Manifold, MD;  Location: [facility] ENDOSCOPY;  Service: Endoscopy;  Laterality: N/A;   DILATION AND CURETTAGE OF UTERUS     TUBAL LIGATION      Current Medications: Current Meds  Medication Sig   albuterol (VENTOLIN HFA) 108 (90 Base) MCG/ACT inhaler Inhale 1 puff into the lungs as needed.   Aspirin-Salicylamide-Caffeine (BC HEADACHE POWDER PO) Take by mouth as needed.   baclofen (LIORESAL) 10 MG tablet Take 10-20 mg by mouth at bedtime as needed.   cetirizine (ZYRTEC) 10 MG tablet Take 10 mg by mouth daily.   DULoxetine (CYMBALTA) 30 MG capsule Take 90 mg by mouth daily.   LINZESS 72 MCG capsule Take 72 mcg by  mouth every morning.   metFORMIN (GLUMETZA) 500 MG (MOD) 24 hr tablet Take 500 mg by mouth daily.   oxyCODONE-acetaminophen (PERCOCET/ROXICET) 5-325 MG tablet Take 1 tablet by mouth every 6 (six) hours as needed.   OXYCONTIN 30 MG 12 hr tablet Take 1 tablet by mouth 2 (two) times daily.   predniSONE (DELTASONE) 20 MG tablet Take 10 mg by mouth daily.   TRELEGY ELLIPTA 100-62.5-25 MCG/ACT AEPB Take 1 puff by mouth daily.     Allergies:   Pregabalin and Sulfamethoxazole-trimethoprim   Social History   Socioeconomic History   Marital status: Divorced    Spouse name: Not on file   Number of children: Not on file   Years of education: Not on file   Highest education level: Not on file  Occupational History   Not on file  Tobacco Use   Smoking status: Every Day    Packs/day: 1.75    Years: 48.00    Total pack years: 84.00    Types: Cigarettes   Smokeless tobacco: Never  Vaping Use   Vaping Use: Never used  Substance and Sexual Activity   Alcohol use: Not Currently   Drug use: Never   Sexual activity: Not on file  Other Topics Concern   Not on file  Social History Narrative   Not on file   Social Determinants of Health   Financial Resource Strain: Not on file  Food Insecurity: Not  on file  Transportation Needs: Not on file  Physical Activity: Not on file  Stress: Not on file  Social Connections: Not on file     Family History: The patient's family history is not on file.  ROS:   Please see the history of present illness.     All other systems reviewed and are negative.  EKGs/Labs/Other Studies Reviewed:    The following studies were reviewed today:   EKG:  EKG is  ordered today.  The ekg ordered today demonstrates sinus tachycardia, otherwise normal, heart rate 100  Recent Labs: No results found for requested labs within last 365 days.  Recent Lipid Panel No results found for: "CHOL", "TRIG", "HDL", "CHOLHDL", "VLDL", "LDLCALC", "LDLDIRECT"   Risk  Assessment/Calculations:         Physical Exam:    VS:  BP 130/84 (BP Location: Right Arm, Patient Position: Sitting, Cuff Size: Normal)   Pulse (!) 109   Ht '5\' 4"'$  (1.626 m)   Wt 175 lb (79.4 kg)   SpO2 93%   BMI 30.04 kg/m     Wt Readings from Last 3 Encounters:  07/22/21 175 lb (79.4 kg)  10/21/19 178 lb (80.7 kg)     GEN:  Well nourished, well developed in no acute distress HEENT: Normal NECK: No JVD; No carotid bruits LYMPHATICS: No lymphadenopathy CARDIAC: RRR, no murmurs, rubs, gallops RESPIRATORY: Diminished breath sounds, no wheezing. ABDOMEN: Soft, non-tender, non-distended MUSCULOSKELETAL:  No edema; No deformity  SKIN: Warm and dry NEUROLOGIC:  Alert and oriented x 3 PSYCHIATRIC:  Normal affect   ASSESSMENT:    1. Coronary artery disease involving native coronary artery of native heart, unspecified whether angina present   2. Dyspnea on exertion   3. Smoking   4. Screening, lipid    PLAN:    In order of problems listed above:  CAD, three-vessel coronary calcification on chest CT.  Obtain fasting lipid profile.  Get echocardiogram.  Get Lexiscan Myoview to evaluate ischemia.  Continue aspirin.  Plan to start statin based on cholesterol results. Dyspnea on exertion, Myoview as above.  Likely from COPD/current smoking. Current smoker, cessation advised.  Follow-up after echo and Myoview.     Shared Decision Making/Informed Consent The risks [chest pain, shortness of breath, cardiac arrhythmias, dizziness, blood pressure fluctuations, myocardial infarction, stroke/transient ischemic attack, nausea, vomiting, allergic reaction, radiation exposure, metallic taste sensation and life-threatening complications (estimated to be 1 in 10,000)], benefits (risk stratification, diagnosing coronary artery disease, treatment guidance) and alternatives of a nuclear stress test were discussed in detail with Ms. Shadrick and she agrees to proceed.    Medication  Adjustments/Labs and Tests Ordered: Current medicines are reviewed at length with the patient today.  Concerns regarding medicines are outlined above.  Orders Placed This Encounter  Procedures   NM Myocar Multi W/Spect W/Wall Motion / EF   Lipid panel   EKG 12-Lead   ECHOCARDIOGRAM COMPLETE   No orders of the defined types were placed in this encounter.   Patient Instructions  Medication Instructions:   Your physician recommends that you continue on your current medications as directed. Please refer to the Current Medication list given to you today.  *If you need a refill on your cardiac medications before your next appointment, please call your pharmacy*   Lab Work:  Your physician recommends that you return for a FASTING lipid profile: At your earliest convenience  - You will need to be fasting. Please do not have anything to eat or  drink after midnight the morning you have the lab work. You may only have water or black coffee with no cream or sugar.   - Please go to the [facility]. You will check in at the front desk to the right as you walk into the atrium. Valet Parking is offered if needed. - No appointment needed. You may go any day between 7 am and 6 pm.     Testing/Procedures:   Your physician has requested that you have an echocardiogram. Echocardiography is a painless test that uses sound waves to create images of your heart. It provides your doctor with information about the size and shape of your heart and how well your heart's chambers and valves are working. This procedure takes approximately one hour. There are no restrictions for this procedure.  2.  [place]   Your caregiver has ordered a Stress Test with nuclear imaging. The purpose of this test is to evaluate the blood supply to your heart muscle. This procedure is referred to as a "Non-Invasive Stress Test." This is because other than having an IV started in your vein, nothing is inserted or  "invades" your body. Cardiac stress tests are done to find areas of poor blood flow to the heart by determining the extent of coronary artery disease (CAD). Some patients exercise on a treadmill, which naturally increases the blood flow to your heart, while others who are  unable to walk on a treadmill due to physical limitations have a pharmacologic/chemical stress agent called Lexiscan . This medicine will mimic walking on a treadmill by temporarily increasing your coronary blood flow.      PLEASE REPORT TO [facility]TR[facility]   THE VOLUNTEERS AT THE FIRST DESK WILL DIRECT YOU WHERE TO GO     *Please note: these test may take anywhere between 2-4 hours to complete       Date of Procedure:_____________________________________   Arrival Time for Procedure:______________________________    PLEASE NOTIFY THE OFFICE AT LEAST 24 HOURS IN ADVANCE IF YOU ARE UNABLE TO KEEP YOUR APPOINTMENT.  [place] 24 HOURS IN ADVANCE IF YOU ARE UNABLE TO KEEP YOUR APPOINTMENT. [phone]     How to prepare for your Myoview test:         _XX___:  Hold diabetes medication the morning of procedure: metFORMIN (GLUMETZA) 500 MG    1. Do not eat or drink after midnight  2. No caffeine for 24 hours prior to test  3. No smoking 24 hours prior to test.  4. Unless instructed otherwise, Take your medication with a small sips of water.    5.         Ladies, please do not wear dresses. Skirts or pants are appropriate. Please wear a short sleeve shirt.  6. No perfume, cologne or lotion.  7. Wear comfortable walking shoes. No heels!      Follow-Up: At [facility], you and your health needs are our priority.  As part of our continuing mission to provide you with exceptional heart care, we have created designated Provider Care Teams.  These Care Teams include your primary Cardiologist (physician) and Advanced Practice Providers (APPs -  Physician  Assistants and Nurse Practitioners) who all work together to provide you with the care you need, when you need it.  We recommend signing up for the patient portal called "MyChart".  Sign up information is provided on this After Visit Summary.  MyChart  is used to connect with patients for Virtual Visits (Telemedicine).  Patients are able to view lab/test results, encounter notes, upcoming appointments, etc.  Non-urgent messages can be sent to your provider as well.   To learn more about what you can do with MyChart, go to [email or web address].    Your next appointment:   Follow up after testing   The format for your next appointment:   In Person  Provider:   You may see Kate Sable, MD or one of the following Advanced Practice Providers on your designated Care Team:   Murray Hodgkins, NP Christell Faith, PA-C Cadence Kathlen Mody, [place]    Other Instructions   Important Information About Sugar         Signed, Kate Sable, MD  07/22/2021 2:53 PM    [place]

## 2021-07-22 NOTE — Patient Instructions (Signed)
Medication Instructions:   Your physician recommends that you continue on your current medications as directed. Please refer to the Current Medication list given to you today.  *If you need a refill on your cardiac medications before your next appointment, please call your pharmacy*   Lab Work:  Your physician recommends that you return for a FASTING lipid profile: At your earliest convenience  - You will need to be fasting. Please do not have anything to eat or drink after midnight the morning you have the lab work. You may only have water or black coffee with no cream or sugar.   - Please go to the [facility]. You will check in at the front desk to the right as you walk into the atrium. Valet Parking is offered if needed. - No appointment needed. You may go any day between 7 am and 6 pm.     Testing/Procedures:   Your physician has requested that you have an echocardiogram. Echocardiography is a painless test that uses sound waves to create images of your heart. It provides your doctor with information about the size and shape of your heart and how well your heart's chambers and valves are working. This procedure takes approximately one hour. There are no restrictions for this procedure.  2.  [place]   Your caregiver has ordered a Stress Test with nuclear imaging. The purpose of this test is to evaluate the blood supply to your heart muscle. This procedure is referred to as a "Non-Invasive Stress Test." This is because other than having an IV started in your vein, nothing is inserted or "invades" your body. Cardiac stress tests are done to find areas of poor blood flow to the heart by determining the extent of coronary artery disease (CAD). Some patients exercise on a treadmill, which naturally increases the blood flow to your heart, while others who are  unable to walk on a treadmill due to physical limitations have a pharmacologic/chemical stress agent called Lexiscan . This  medicine will mimic walking on a treadmill by temporarily increasing your coronary blood flow.      PLEASE REPORT TO [facility] ENTRANCE   THE VOLUNTEERS AT THE FIRST DESK WILL DIRECT YOU WHERE TO GO     *Please note: these test may take anywhere between 2-4 hours to complete       Date of Procedure:_____________________________________   Arrival Time for Procedure:______________________________    PLEASE NOTIFY THE OFFICE AT LEAST 24 HOURS IN ADVANCE IF YOU ARE UNABLE TO KEEP YOUR APPOINTMENT.  [place] 24 HOURS IN ADVANCE IF YOU ARE UNABLE TO KEEP YOUR APPOINTMENT. [phone]     How to prepare for your Myoview test:         _XX___:  Hold diabetes medication the morning of procedure: metFORMIN (GLUMETZA) 500 MG    1. Do not eat or drink after midnight  2. No caffeine for 24 hours prior to test  3. No smoking 24 hours prior to test.  4. Unless instructed otherwise, Take your medication with a small sips of water.    5.         Ladies, please do not wear dresses. Skirts or pants are appropriate. Please wear a short sleeve shirt.  6. No perfume, cologne or lotion.  7. Wear comfortable walking shoes. No heels!      Follow-Up: At [facility], you and your health needs are our priority.  As  part of our continuing mission to provide you with exceptional heart care, we have created designated Provider Care Teams.  These Care Teams include your primary Cardiologist (physician) and Advanced Practice Providers (APPs -  Physician Assistants and Nurse Practitioners) who all work together to provide you with the care you need, when you need it.  We recommend signing up for the patient portal called "MyChart".  Sign up information is provided on this After Visit Summary.  MyChart is used to connect with patients for Virtual Visits (Telemedicine).  Patients are able to view lab/test results, encounter notes, upcoming  appointments, etc.  Non-urgent messages can be sent to your provider as well.   To learn more about what you can do with MyChart, go to [email or web address].    Your next appointment:   Follow up after testing   The format for your next appointment:   In Person  Provider:   You may see Kate Sable, MD or one of the following Advanced Practice Providers on your designated Care Team:   Murray Hodgkins, NP Christell Faith, PA-C Cadence Kathlen Mody, [place]    Other Instructions   Important Information About Sugar

## 2021-08-10 ENCOUNTER — Encounter
Admission: RE | Admit: 2021-08-10 | Discharge: 2021-08-10 | Disposition: A | Discharge status: Home / Self Care | Payer: Medicare HMO | Source: Ambulatory Visit | Attending: Cardiology | Admitting: Cardiology

## 2021-08-10 DIAGNOSIS — R0609 Other forms of dyspnea: Secondary | ICD-10-CM | POA: Insufficient documentation

## 2021-08-10 LAB — NM MYOCAR MULTI W/SPECT W/WALL MOTION / EF
Base ST Depression (mm): 0 mm
LV dias vol: 40 mL (ref 46–106)
LV sys vol: 14 mL
Nuc Stress EF: 65 %
Peak HR: 104 {beats}/min
Percent HR: 64 %
Rest HR: 85 {beats}/min
Rest Nuclear Isotope Dose: 10.3 mCi
SDS: 0
SRS: 5
SSS: 5
ST Depression (mm): 0 mm
Stress Nuclear Isotope Dose: 27.9 mCi
TID: 1.02

## 2021-08-10 MED ORDER — TECHNETIUM TC 99M TETROFOSMIN IV KIT
27.8900 | PACK | Freq: Once | INTRAVENOUS | Status: AC | PRN
  Administered 2021-08-10: 27.89 via INTRAVENOUS

## 2021-08-10 MED ORDER — REGADENOSON 0.4 MG/5ML IV SOLN
0.4000 mg | Freq: Once | INTRAVENOUS | Status: AC
  Administered 2021-08-10: 0.4 mg via INTRAVENOUS

## 2021-08-10 MED ORDER — TECHNETIUM TC 99M TETROFOSMIN IV KIT
10.2800 | PACK | Freq: Once | INTRAVENOUS | Status: AC | PRN
  Administered 2021-08-10: 10.28 via INTRAVENOUS

## 2021-08-25 ENCOUNTER — Ambulatory Visit: Payer: Medicare HMO

## 2021-08-25 DIAGNOSIS — I2510 Atherosclerotic heart disease of native coronary artery without angina pectoris: Secondary | ICD-10-CM

## 2021-08-25 LAB — ECHOCARDIOGRAM COMPLETE
AR max vel: 2.47 cm2
AV Area VTI: 2.65 cm2
AV Area mean vel: 2.62 cm2
AV Mean grad: 3 mmHg
AV Peak grad: 4.7 mmHg
Ao pk vel: 1.08 m/s
Calc EF: 64.4 %
S' Lateral: 2.6 cm
Single Plane A2C EF: 61.9 %
Single Plane A4C EF: 63.8 %

## 2021-09-19 ENCOUNTER — Ambulatory Visit: Payer: Medicare HMO | Admitting: Cardiology

## 2021-09-20 ENCOUNTER — Encounter: Payer: Self-pay | Admitting: Cardiology

## 2021-09-20 ENCOUNTER — Ambulatory Visit (INDEPENDENT_AMBULATORY_CARE_PROVIDER_SITE_OTHER): Payer: Medicare HMO | Admitting: Cardiology

## 2021-09-20 VITALS — BP 118/80 | HR 89 | Ht 64.0 in | Wt 176.2 lb

## 2021-09-20 DIAGNOSIS — F17200 Nicotine dependence, unspecified, uncomplicated: Secondary | ICD-10-CM | POA: Diagnosis not present

## 2021-09-20 DIAGNOSIS — I2510 Atherosclerotic heart disease of native coronary artery without angina pectoris: Secondary | ICD-10-CM

## 2021-09-20 DIAGNOSIS — R0609 Other forms of dyspnea: Secondary | ICD-10-CM | POA: Diagnosis not present

## 2021-09-20 MED ORDER — ATORVASTATIN CALCIUM 20 MG PO TABS: 20.0000 mg | ORAL_TABLET | Freq: Every day | ORAL | 3 refills | Status: DC

## 2021-09-20 NOTE — Progress Notes (Signed)
Cardiology Office Note:    Date:  09/20/2021   ID:  Laurie Berg, DOB Aug 27, 1960, MRN [id number]  PCP:  Marguerita Merles, MD   [place] Providers Cardiologist:  Kate Sable, MD     Referring MD: Marguerita Merles, MD   Chief Complaint  Patient presents with   Follow-up    Follow up from testing. Patient states that she is feeling ok today. Meds reviewed with patient.     History of Present Illness:    Laurie Berg is a 61 y.o. female with a hx of CAD (cor Ca2+ on chest CT), diabetes, current smoker x40+ years, COPD who presents for follow-up.  Previously seen due to coronary artery calcifications, shortness of breath with exertion.  Echocardiogram and Lexiscan Myoview were ordered to evaluate presence of ischemia and cardiac function.  She still smokes, is working on quitting.  Has no new concerns at this time, presents for testing results.   Past Medical History:  Diagnosis Date   COPD (chronic obstructive pulmonary disease) ([place])    Diabetes mellitus without complication ([place])     Past Surgical History:  Procedure Laterality Date   CARPAL TUNNEL RELEASE     COLONOSCOPY WITH PROPOFOL N/A 10/21/2019   Procedure: COLONOSCOPY WITH PROPOFOL;  Surgeon: Virgel Manifold, MD;  Location: [facility] ENDOSCOPY;  Service: Endoscopy;  Laterality: N/A;   DILATION AND CURETTAGE OF UTERUS     TUBAL LIGATION      Current Medications: Current Meds  Medication Sig   albuterol (VENTOLIN HFA) 108 (90 Base) MCG/ACT inhaler Inhale 1 puff into the lungs as needed.   Aspirin-Salicylamide-Caffeine (BC HEADACHE POWDER PO) Take by mouth as needed.   atorvastatin (LIPITOR) 20 MG tablet Take 1 tablet (20 mg total) by mouth daily.   cetirizine (ZYRTEC) 10 MG tablet Take 10 mg by mouth daily.   cyclobenzaprine (FLEXERIL) 10 MG tablet Take 10 mg by mouth 2 (two) times daily as needed.   DULoxetine (CYMBALTA) 30 MG capsule Take 90 mg by mouth daily.   LINZESS 72 MCG capsule Take 72 mcg  by mouth every morning.   metFORMIN (GLUMETZA) 500 MG (MOD) 24 hr tablet Take 500 mg by mouth daily.   oxyCODONE-acetaminophen (PERCOCET/ROXICET) 5-325 MG tablet Take 1 tablet by mouth every 6 (six) hours as needed.   OXYCONTIN 30 MG 12 hr tablet Take 1 tablet by mouth 2 (two) times daily.   TRELEGY ELLIPTA 100-62.5-25 MCG/ACT AEPB Take 1 puff by mouth daily.     Allergies:   Pregabalin and Sulfamethoxazole-trimethoprim   Social History   Socioeconomic History   Marital status: Divorced    Spouse name: Not on file   Number of children: Not on file   Years of education: Not on file   Highest education level: Not on file  Occupational History   Not on file  Tobacco Use   Smoking status: Every Day    Packs/day: 1.75    Years: 48.00    Total pack years: 84.00    Types: Cigarettes   Smokeless tobacco: Never  Vaping Use   Vaping Use: Never used  Substance and Sexual Activity   Alcohol use: Not Currently   Drug use: Never   Sexual activity: Not on file  Other Topics Concern   Not on file  Social History Narrative   Not on file   Social Determinants of Health   Financial Resource Strain: Not on file  Food Insecurity: Not on file  Transportation Needs:  Not on file  Physical Activity: Not on file  Stress: Not on file  Social Connections: Not on file     Family History: The patient's family history is not on file.  ROS:   Please see the history of present illness.     All other systems reviewed and are negative.  EKGs/Labs/Other Studies Reviewed:    The following studies were reviewed today:   EKG:  EKG is  ordered today.  The ekg ordered today demonstrates normal sinus rhythm, normal ECG.  Recent Labs: No results found for requested labs within last 365 days.  Recent Lipid Panel No results found for: "CHOL", "TRIG", "HDL", "CHOLHDL", "VLDL", "LDLCALC", "LDLDIRECT"   Risk Assessment/Calculations:         Physical Exam:    VS:  BP 118/80 (BP Location: Left  Arm, Patient Position: Sitting, Cuff Size: Normal)   Pulse 89   Ht '5\' 4"'$  (1.626 m)   Wt 176 lb 3.2 oz (79.9 kg)   SpO2 91%   BMI 30.24 kg/m     Wt Readings from Last 3 Encounters:  09/20/21 176 lb 3.2 oz (79.9 kg)  07/22/21 175 lb (79.4 kg)  10/21/19 178 lb (80.7 kg)     GEN:  Well nourished, well developed in no acute distress HEENT: Normal NECK: No JVD; No carotid bruits CARDIAC: RRR, no murmurs, rubs, gallops RESPIRATORY: Diminished breath sounds, no wheezing. ABDOMEN: Soft, non-tender, non-distended MUSCULOSKELETAL:  No edema; No deformity  SKIN: Warm and dry NEUROLOGIC:  Alert and oriented x 3 PSYCHIATRIC:  Normal affect   ASSESSMENT:    1. Coronary artery disease involving native coronary artery of native heart, unspecified whether angina present   2. Dyspnea on exertion   3. Smoking     PLAN:    In order of problems listed above:  CAD, three-vessel coronary calcification on chest CT. Echo shows normal EF 55 to 60%, no wall motion abnormalities, Myoview with no ischemia, continue aspirin, start Lipitor 20 mg daily.  Plans to obtain fasting lipid profile with PCP in 2 days. Dyspnea on exertion, Myoview as above.  Likely from COPD/current smoking. Current smoker, cessation advised.  Follow-up in 6 months.   Medication Adjustments/Labs and Tests Ordered: Current medicines are reviewed at length with the patient today.  Concerns regarding medicines are outlined above.  Orders Placed This Encounter  Procedures   EKG 12-Lead   Meds ordered this encounter  Medications   atorvastatin (LIPITOR) 20 MG tablet    Sig: Take 1 tablet (20 mg total) by mouth daily.    Dispense:  30 tablet    Refill:  3    Patient Instructions  Medication Instructions:  Your physician has recommended you make the following change in your medication:   START taking atorvastatin (Lipitor) 20 mg daily   *If you need a refill on your cardiac medications before your next appointment,  please call your pharmacy*   Lab Work: None ordered  If you have labs (blood work) drawn today and your tests are completely normal, you will receive your results only by: [place] (if you have MyChart) OR A paper copy in the mail If you have any lab test that is abnormal or we need to change your treatment, we will call you to review the results.   Testing/Procedures: None ordered   Follow-Up: At [facility], you and your health needs are our priority.  As part of our continuing mission to provide you with exceptional heart care, we  have created designated Provider Care Teams.  These Care Teams include your primary Cardiologist (physician) and Advanced Practice Providers (APPs -  Physician Assistants and Nurse Practitioners) who all work together to provide you with the care you need, when you need it.  We recommend signing up for the patient portal called "MyChart".  Sign up information is provided on this After Visit Summary.  MyChart is used to connect with patients for Virtual Visits (Telemedicine).  Patients are able to view lab/test results, encounter notes, upcoming appointments, etc.  Non-urgent messages can be sent to your provider as well.   To learn more about what you can do with MyChart, go to [email or web address].    Your next appointment:   6 month(s)  The format for your next appointment:   In Person  Provider:   You may see Kate Sable, MD or one of the following Advanced Practice Providers on your designated Care Team:   Murray Hodgkins, NP Christell Faith, PA-C Cadence Kathlen Mody, [place]   Other Instructions N/A  Important Information About Sugar         Signed, Kate Sable, MD  09/20/2021 5:13 PM    [place]

## 2021-09-20 NOTE — Patient Instructions (Signed)
Medication Instructions:  Your physician has recommended you make the following change in your medication:   START taking atorvastatin (Lipitor) 20 mg daily   *If you need a refill on your cardiac medications before your next appointment, please call your pharmacy*   Lab Work: None ordered  If you have labs (blood work) drawn today and your tests are completely normal, you will receive your results only by: [place] (if you have MyChart) OR A paper copy in the mail If you have any lab test that is abnormal or we need to change your treatment, we will call you to review the results.   Testing/Procedures: None ordered   Follow-Up: At [facility], you and your health needs are our priority.  As part of our continuing mission to provide you with exceptional heart care, we have created designated Provider Care Teams.  These Care Teams include your primary Cardiologist (physician) and Advanced Practice Providers (APPs -  Physician Assistants and Nurse Practitioners) who all work together to provide you with the care you need, when you need it.  We recommend signing up for the patient portal called "MyChart".  Sign up information is provided on this After Visit Summary.  MyChart is used to connect with patients for Virtual Visits (Telemedicine).  Patients are able to view lab/test results, encounter notes, upcoming appointments, etc.  Non-urgent messages can be sent to your provider as well.   To learn more about what you can do with MyChart, go to [email or web address].    Your next appointment:   6 month(s)  The format for your next appointment:   In Person  Provider:   You may see Kate Sable, MD or one of the following Advanced Practice Providers on your designated Care Team:   Murray Hodgkins, NP Christell Faith, PA-C Cadence Kathlen Mody, [place]   Other Instructions N/A  Important Information About Sugar

## 2021-12-23 ENCOUNTER — Other Ambulatory Visit: Payer: Self-pay

## 2021-12-23 MED ORDER — ATORVASTATIN CALCIUM 20 MG PO TABS: 20.0000 mg | ORAL_TABLET | Freq: Every day | ORAL | 2 refills | Status: DC

## 2022-02-27 ENCOUNTER — Other Ambulatory Visit: Payer: Self-pay | Admitting: Pulmonary Disease

## 2022-02-27 DIAGNOSIS — J189 Pneumonia, unspecified organism: Secondary | ICD-10-CM

## 2022-03-10 ENCOUNTER — Ambulatory Visit
Admission: RE | Admit: 2022-03-10 | Discharge: 2022-03-10 | Disposition: A | Discharge status: Home / Self Care | Payer: Medicare HMO | Source: Ambulatory Visit | Attending: Pulmonary Disease | Admitting: Pulmonary Disease

## 2022-03-10 DIAGNOSIS — J189 Pneumonia, unspecified organism: Secondary | ICD-10-CM | POA: Diagnosis present

## 2022-03-10 MED ORDER — IOHEXOL 300 MG/ML  SOLN
75.0000 mL | Freq: Once | INTRAMUSCULAR | Status: AC | PRN
  Administered 2022-03-10: 75 mL via INTRAVENOUS

## 2022-03-16 ENCOUNTER — Inpatient Hospital Stay
Admission: EM | Admit: 2022-03-16 | Discharge: 2022-03-21 | DRG: 871 | Disposition: A | Discharge status: Home Health | Payer: Medicare HMO | Attending: Hospitalist | Admitting: Hospitalist

## 2022-03-16 ENCOUNTER — Emergency Department: Payer: Medicare HMO

## 2022-03-16 ENCOUNTER — Other Ambulatory Visit: Payer: Self-pay

## 2022-03-16 ENCOUNTER — Encounter: Payer: Self-pay | Admitting: Emergency Medicine

## 2022-03-16 DIAGNOSIS — F17200 Nicotine dependence, unspecified, uncomplicated: Secondary | ICD-10-CM | POA: Insufficient documentation

## 2022-03-16 DIAGNOSIS — J432 Centrilobular emphysema: Secondary | ICD-10-CM | POA: Diagnosis present

## 2022-03-16 DIAGNOSIS — E1165 Type 2 diabetes mellitus with hyperglycemia: Secondary | ICD-10-CM | POA: Diagnosis present

## 2022-03-16 DIAGNOSIS — G894 Chronic pain syndrome: Secondary | ICD-10-CM | POA: Diagnosis present

## 2022-03-16 DIAGNOSIS — A419 Sepsis, unspecified organism: Secondary | ICD-10-CM

## 2022-03-16 DIAGNOSIS — J984 Other disorders of lung: Secondary | ICD-10-CM | POA: Diagnosis present

## 2022-03-16 DIAGNOSIS — R079 Chest pain, unspecified: Secondary | ICD-10-CM | POA: Diagnosis not present

## 2022-03-16 DIAGNOSIS — Z7984 Long term (current) use of oral hypoglycemic drugs: Secondary | ICD-10-CM | POA: Diagnosis not present

## 2022-03-16 DIAGNOSIS — R6520 Severe sepsis without septic shock: Secondary | ICD-10-CM | POA: Diagnosis present

## 2022-03-16 DIAGNOSIS — T380X5A Adverse effect of glucocorticoids and synthetic analogues, initial encounter: Secondary | ICD-10-CM | POA: Diagnosis present

## 2022-03-16 DIAGNOSIS — A4189 Other specified sepsis: Secondary | ICD-10-CM | POA: Diagnosis present

## 2022-03-16 DIAGNOSIS — B370 Candidal stomatitis: Secondary | ICD-10-CM | POA: Diagnosis present

## 2022-03-16 DIAGNOSIS — J440 Chronic obstructive pulmonary disease with acute lower respiratory infection: Secondary | ICD-10-CM | POA: Diagnosis present

## 2022-03-16 DIAGNOSIS — J9601 Acute respiratory failure with hypoxia: Secondary | ICD-10-CM | POA: Diagnosis present

## 2022-03-16 DIAGNOSIS — Z79899 Other long term (current) drug therapy: Secondary | ICD-10-CM

## 2022-03-16 DIAGNOSIS — J9621 Acute and chronic respiratory failure with hypoxia: Secondary | ICD-10-CM | POA: Diagnosis present

## 2022-03-16 DIAGNOSIS — J441 Chronic obstructive pulmonary disease with (acute) exacerbation: Secondary | ICD-10-CM

## 2022-03-16 DIAGNOSIS — F17210 Nicotine dependence, cigarettes, uncomplicated: Secondary | ICD-10-CM | POA: Diagnosis present

## 2022-03-16 DIAGNOSIS — Z91119 Patient's noncompliance with dietary regimen due to unspecified reason: Secondary | ICD-10-CM | POA: Diagnosis not present

## 2022-03-16 DIAGNOSIS — E11649 Type 2 diabetes mellitus with hypoglycemia without coma: Secondary | ICD-10-CM | POA: Diagnosis present

## 2022-03-16 DIAGNOSIS — J209 Acute bronchitis, unspecified: Secondary | ICD-10-CM

## 2022-03-16 DIAGNOSIS — U071 COVID-19: Secondary | ICD-10-CM

## 2022-03-16 DIAGNOSIS — I2510 Atherosclerotic heart disease of native coronary artery without angina pectoris: Secondary | ICD-10-CM | POA: Insufficient documentation

## 2022-03-16 DIAGNOSIS — E0800 Diabetes mellitus due to underlying condition with hyperosmolarity without nonketotic hyperglycemic-hyperosmolar coma (NKHHC): Secondary | ICD-10-CM

## 2022-03-16 DIAGNOSIS — J208 Acute bronchitis due to other specified organisms: Secondary | ICD-10-CM | POA: Diagnosis present

## 2022-03-16 DIAGNOSIS — R072 Precordial pain: Secondary | ICD-10-CM | POA: Diagnosis not present

## 2022-03-16 HISTORY — DX: Chronic pain syndrome: G89.4

## 2022-03-16 HISTORY — DX: Polyp of colon: K63.5

## 2022-03-16 HISTORY — DX: COVID-19: U07.1

## 2022-03-16 HISTORY — DX: Tobacco use: Z72.0

## 2022-03-16 HISTORY — DX: Atherosclerotic heart disease of native coronary artery without angina pectoris: I25.10

## 2022-03-16 HISTORY — DX: Radiculopathy, cervical region: M54.12

## 2022-03-16 HISTORY — DX: Personal history of other medical treatment: Z92.89

## 2022-03-16 HISTORY — DX: Solitary pulmonary nodule: R91.1

## 2022-03-16 HISTORY — DX: Sepsis, unspecified organism: A41.9

## 2022-03-16 LAB — COMPREHENSIVE METABOLIC PANEL
ALT: 22 U/L (ref 0–44)
AST: 24 U/L (ref 15–41)
Albumin: 3.4 g/dL — ABNORMAL LOW (ref 3.5–5.0)
Alkaline Phosphatase: 56 U/L (ref 38–126)
Anion gap: 12 (ref 5–15)
BUN: 18 mg/dL (ref 6–20)
CO2: 23 mmol/L (ref 22–32)
Calcium: 8.3 mg/dL — ABNORMAL LOW (ref 8.9–10.3)
Chloride: 100 mmol/L (ref 98–111)
Creatinine, Ser: 0.88 mg/dL (ref 0.44–1.00)
GFR, Estimated: >60 mL/min (ref >60)
Glucose, Bld: 153 mg/dL — ABNORMAL HIGH (ref 70–99)
Potassium: 3.5 mmol/L (ref 3.5–5.1)
Sodium: 135 mmol/L (ref 135–145)
Total Bilirubin: 0.4 mg/dL (ref 0.3–1.2)
Total Protein: 6.8 g/dL (ref 6.5–8.1)

## 2022-03-16 LAB — CBC WITH DIFFERENTIAL/PLATELET
Abs Immature Granulocytes: 0.02 10*3/uL (ref 0.00–0.07)
Basophils Absolute: 0 10*3/uL (ref 0.0–0.1)
Basophils Relative: 0 %
Eosinophils Absolute: 0.1 10*3/uL (ref 0.0–0.5)
Eosinophils Relative: 1 %
HCT: 45.3 % (ref 36.0–46.0)
Hemoglobin: 14.8 g/dL (ref 12.0–15.0)
Immature Granulocytes: 0 %
Lymphocytes Relative: 18 %
Lymphs Abs: 1.3 10*3/uL (ref 0.7–4.0)
MCH: 28.9 pg (ref 26.0–34.0)
MCHC: 32.7 g/dL (ref 30.0–36.0)
MCV: 88.5 fL (ref 80.0–100.0)
Monocytes Absolute: 0.4 10*3/uL (ref 0.1–1.0)
Monocytes Relative: 6 %
Neutro Abs: 5.6 10*3/uL (ref 1.7–7.7)
Neutrophils Relative %: 75 %
Platelets: 180 10*3/uL (ref 150–400)
RBC: 5.12 MIL/uL — ABNORMAL HIGH (ref 3.87–5.11)
RDW: 14.9 % (ref 11.5–15.5)
WBC: 7.4 10*3/uL (ref 4.0–10.5)
nRBC: 0 % (ref 0.0–0.2)

## 2022-03-16 LAB — RESP PANEL BY RT-PCR (RSV, FLU A&B, COVID)  RVPGX2
Influenza A by PCR: NEGATIVE
Influenza B by PCR: NEGATIVE
Resp Syncytial Virus by PCR: NEGATIVE
SARS Coronavirus 2 by RT PCR: POSITIVE — AB

## 2022-03-16 LAB — LACTIC ACID, PLASMA: Lactic Acid, Venous: 1.2 mmol/L (ref 0.5–1.9)

## 2022-03-16 LAB — TROPONIN I (HIGH SENSITIVITY): Troponin I (High Sensitivity): 8 ng/L (ref <18)

## 2022-03-16 MED ORDER — ACETAMINOPHEN 500 MG PO TABS
ORAL_TABLET | ORAL | Status: AC
  Administered 2022-03-16: 1000 mg via ORAL
  Filled 2022-03-16: qty 2

## 2022-03-16 MED ORDER — ACETAMINOPHEN 500 MG PO TABS: 1000.0000 mg | ORAL_TABLET | Freq: Once | ORAL | Status: AC

## 2022-03-16 MED ORDER — ONDANSETRON HCL 4 MG PO TABS: 4.0000 mg | ORAL_TABLET | Freq: Four times a day (QID) | ORAL | Status: DC | PRN

## 2022-03-16 MED ORDER — ACETAMINOPHEN 325 MG PO TABS: 650.0000 mg | ORAL_TABLET | Freq: Four times a day (QID) | ORAL | Status: DC | PRN

## 2022-03-16 MED ORDER — GUAIFENESIN ER 600 MG PO TB12
600.0000 mg | ORAL_TABLET | Freq: Two times a day (BID) | ORAL | Status: DC
  Administered 2022-03-17 – 2022-03-21 (×9): 600 mg via ORAL
  Filled 2022-03-16 (×9): qty 1

## 2022-03-16 MED ORDER — ENOXAPARIN SODIUM 40 MG/0.4ML IJ SOSY
40.0000 mg | PREFILLED_SYRINGE | Freq: Every day | INTRAMUSCULAR | Status: DC
  Administered 2022-03-17 – 2022-03-20 (×5): 40 mg via SUBCUTANEOUS
  Filled 2022-03-16 (×5): qty 0.4

## 2022-03-16 MED ORDER — ONDANSETRON HCL 4 MG/2ML IJ SOLN: 4.0000 mg | Freq: Four times a day (QID) | INTRAMUSCULAR | Status: DC | PRN

## 2022-03-16 MED ORDER — NICOTINE 21 MG/24HR TD PT24
21.0000 mg | MEDICATED_PATCH | Freq: Every day | TRANSDERMAL | Status: DC
  Administered 2022-03-17 – 2022-03-21 (×5): 21 mg via TRANSDERMAL
  Filled 2022-03-16 (×6): qty 1

## 2022-03-16 MED ORDER — ALBUTEROL SULFATE HFA 108 (90 BASE) MCG/ACT IN AERS
2.0000 | INHALATION_SPRAY | RESPIRATORY_TRACT | Status: DC | PRN
  Filled 2022-03-16: qty 6.7

## 2022-03-16 MED ORDER — IPRATROPIUM-ALBUTEROL 0.5-2.5 (3) MG/3ML IN SOLN
3.0000 mL | Freq: Once | RESPIRATORY_TRACT | Status: AC
  Administered 2022-03-16: 3 mL via RESPIRATORY_TRACT
  Filled 2022-03-16: qty 3

## 2022-03-16 MED ORDER — SODIUM CHLORIDE 0.9 % IV BOLUS (SEPSIS)
1000.0000 mL | Freq: Once | INTRAVENOUS | Status: AC
  Administered 2022-03-17: 1000 mL via INTRAVENOUS

## 2022-03-16 MED ORDER — OXYCODONE HCL 5 MG PO TABS
5.0000 mg | ORAL_TABLET | ORAL | Status: DC | PRN
  Administered 2022-03-17 – 2022-03-21 (×11): 5 mg via ORAL
  Filled 2022-03-16 (×11): qty 1

## 2022-03-16 MED ORDER — METHYLPREDNISOLONE SODIUM SUCC 40 MG IJ SOLR: 40.0000 mg | Freq: Two times a day (BID) | INTRAMUSCULAR | Status: AC

## 2022-03-16 MED ORDER — METHYLPREDNISOLONE SODIUM SUCC 125 MG IJ SOLR
125.0000 mg | Freq: Once | INTRAMUSCULAR | Status: AC
  Administered 2022-03-16: 125 mg via INTRAVENOUS
  Filled 2022-03-16: qty 2

## 2022-03-16 MED ORDER — SODIUM CHLORIDE 0.9 % IV SOLN: INTRAVENOUS | Status: DC

## 2022-03-16 MED ORDER — PREDNISONE 20 MG PO TABS: 40.0000 mg | ORAL_TABLET | Freq: Every day | ORAL | Status: DC

## 2022-03-16 MED ORDER — IPRATROPIUM-ALBUTEROL 0.5-2.5 (3) MG/3ML IN SOLN: 3.0000 mL | Freq: Four times a day (QID) | RESPIRATORY_TRACT | Status: DC

## 2022-03-16 MED ORDER — ACETAMINOPHEN 650 MG RE SUPP: 650.0000 mg | Freq: Four times a day (QID) | RECTAL | Status: DC | PRN

## 2022-03-16 NOTE — Assessment & Plan Note (Deleted)
  Sepsis secondary to COVID-19 infection COPD exacerbation Acute respiratory failure with hypoxia Sepsis criteria includes fever, tachycardia and tachypnea with respiratory failure requiring O2 at 3 L WBC normal as well as lactic acid.  Will add procalcitonin Follow respiratory viral panel Sepsis fluids Albuterol every 6 and as needed, IV steroids COVID precautions Follow blood cultures

## 2022-03-16 NOTE — Assessment & Plan Note (Signed)
Nicotine patch

## 2022-03-16 NOTE — ED Provider Notes (Signed)
[facility] Provider Note    Event Date/Time   First MD Initiated Contact with Patient 03/16/22 2148     (approximate)   History   Shortness of Breath   HPI  Laurie Berg is a 62 y.o. female history of reported COPD and diabetes not on home oxygen presents to the ER for worsening shortness of breath over the past 3 to 4 days.  Fully was recently treated as an outpatient with pneumonia is completing antibiotics a few days ago.  For just finishing up prednisone as well.  Has been using nebulizers without much improvement.  Has been encouraged by family to come to the ER for the past few days and finally reluctantly came today.  Found to be hypoxic on room air.     Physical Exam   Triage Vital Signs: ED Triage Vitals  Enc Vitals Group     BP 03/16/22 2146 121/83     Pulse Rate 03/16/22 2146 (!) 116     Resp 03/16/22 2146 (!) 24     Temp 03/16/22 2146 (!) 102.9 F (39.4 C)     Temp Source 03/16/22 2146 Oral     SpO2 03/16/22 2146 (!) 80 %     Weight 03/16/22 2200 175 lb 7.8 oz (79.6 kg)     Height 03/16/22 2200 '5\' 4"'$  (1.626 m)     Head Circumference --      Peak Flow --      Pain Score 03/16/22 2159 0     Pain Loc --      Pain Edu? --      Excl. in [place]? --     Most recent vital signs: Vitals:   03/16/22 2200 03/16/22 2247  BP: 125/78   Pulse: (!) 113 (!) 108  Resp: 16 20  Temp:    SpO2: 95% 96%     Constitutional: Alert  Eyes: Conjunctivae are normal.  Head: Atraumatic. Nose: No congestion/rhinnorhea. Mouth/Throat: Mucous membranes are moist.   Neck: Painless ROM.  Cardiovascular:   Good peripheral circulation. Respiratory: Mild tachypnea Procomp prolonged expiratory phase.  Diffuse expiratory wheezing throughout. Gastrointestinal: Soft and nontender.  Musculoskeletal:  no deformity Neurologic:  MAE spontaneously. No gross focal neurologic deficits are appreciated.  Skin:  Skin is warm, dry and intact. No rash  noted. Psychiatric: Mood and affect are normal. Speech and behavior are normal.    ED Results / Procedures / Treatments   Labs (all labs ordered are listed, but only abnormal results are displayed) Labs Reviewed  CBC WITH DIFFERENTIAL/PLATELET - Abnormal; Notable for the following components:      Result Value   RBC 5.12 (*)    All other components within normal limits  COMPREHENSIVE METABOLIC PANEL - Abnormal; Notable for the following components:   Glucose, Bld 153 (*)    Calcium 8.3 (*)    Albumin 3.4 (*)    All other components within normal limits  CULTURE, BLOOD (ROUTINE X 2)  CULTURE, BLOOD (ROUTINE X 2)  RESP PANEL BY RT-PCR (RSV, FLU A&B, COVID)  [id number]  LACTIC ACID, PLASMA  LACTIC ACID, PLASMA  TROPONIN I (HIGH SENSITIVITY)     EKG  ED ECG REPORT I, Merlyn Lot, the attending physician, personally viewed and interpreted this ECG.   Date: 03/16/2022  EKG Time: 21:51  Rate: 115  Rhythm: sinus  Axis: normal  Intervals: normal  ST&T Change: no stemi, no depressions    RADIOLOGY Please see ED Course for my  review and interpretation.  I personally reviewed all radiographic images ordered to evaluate for the above acute complaints and reviewed radiology reports and findings.  These findings were personally discussed with the patient.  Please see medical record for radiology report.    PROCEDURES:  Critical Care performed: Yes, see critical care procedure note(s)  .Critical Care  Performed by: Merlyn Lot, MD Authorized by: Merlyn Lot, MD   Critical care provider statement:    Critical care time (minutes):  35   Critical care was necessary to treat or prevent imminent or life-threatening deterioration of the following conditions:  Respiratory failure   Critical care was time spent personally by me on the following activities:  Ordering and performing treatments and interventions, ordering and review of laboratory studies, ordering and  review of radiographic studies, pulse oximetry, re-evaluation of patient's condition, review of old charts, obtaining history from patient or surrogate, examination of patient, evaluation of patient's response to treatment, discussions with primary provider, discussions with consultants and development of treatment plan with patient or surrogate    MEDICATIONS ORDERED IN ED: Medications  methylPREDNISolone sodium succinate (SOLU-MEDROL) 125 mg/2 mL injection 125 mg (125 mg Intravenous Given 03/16/22 2243)  ipratropium-albuterol (DUONEB) 0.5-2.5 (3) MG/3ML nebulizer solution 3 mL (3 mLs Nebulization Given 03/16/22 2244)  ipratropium-albuterol (DUONEB) 0.5-2.5 (3) MG/3ML nebulizer solution 3 mL (3 mLs Nebulization Given 03/16/22 2244)  acetaminophen (TYLENOL) tablet 1,000 mg (1,000 mg Oral Given 03/16/22 2244)     IMPRESSION / MDM / [place] / ED COURSE  I reviewed the triage vital signs and the nursing notes.                              Differential diagnosis includes, but is not limited to, Asthma, copd, CHF, pna, ptx, malignancy, Pe, anemia   Patient presenting to the ER for evaluation of symptoms as described above.  Based on symptoms, risk factors and considered above differential, this presenting complaint could reflect a potentially life-threatening illness therefore the patient will be placed on continuous pulse oximetry and telemetry for monitoring.  Laboratory evaluation will be sent to evaluate for the above complaints.    Clinical Course as of 03/16/22 2319  Thu Mar 16, 2022  2232 Chest x-ray my review and interpretation without consolidation or infiltrate.  No sign of pneumonia.  Possible viral illness given her fever, chest x-ray more consistent with COPD.  Lactate normal.  No white count. [PR]  2319 Consulted hospitalist for admission. [PR]    Clinical Course User Index [PR] Merlyn Lot, MD     FINAL CLINICAL IMPRESSION(S) / ED DIAGNOSES   Final diagnoses:   Acute respiratory failure with hypoxia ([place])  COPD with acute exacerbation ([place])     Rx / DC Orders   ED Discharge Orders     None        Note:  This document was prepared using Dragon voice recognition software and may include unintentional dictation errors.    Merlyn Lot, MD 03/16/22 [id number]

## 2022-03-16 NOTE — H&P (Addendum)
History and Physical    Patient: Laurie Berg IZT:[id number] DOB: Sep 06, 1960 DOA: 03/16/2022 DOS: the patient was seen and examined on 03/16/2022 PCP: Marguerita Merles, MD  Patient coming from: Home  Chief Complaint:  Chief Complaint  Patient presents with   Shortness of Breath    HPI: Laurie Berg is a 62 y.o. female with medical history significant for CAD (cor Ca2+ on chest CT), diabetes, current smoker x40+ years COPD not on oxygen who presents to the ED with a several day history of progressively worsening shortness of breath not responding to home inhalers.  She was previously treated for outpatient pneumonia and completed antibiotics and a course of steroids a few days prior.  She denies chest pain, vomiting, abdominal pain or diarrhea.  She was sick to 86% with EMS requiring 4 L to maintain sats in the mid 90s. ED course and data Review: Febrile to 102.9 on arrival with heart rate 116 and respirations 24 with O2 sat 80%, currently requiring 3 L to maintain sats in the mid 90s.  BP soft around 108/69.  Labs for the most part unremarkable with normal WBC and lactic acid and.  Respiratory viral panel still pending.  Troponin was 8. EKG, personally viewed and interpreted showed sinus tachycardia at 115 with nonspecific ST-T wave changes.  Chest x-ray was nonacute showing hyperinflated lungs suggesting COPD.  Patient was treated with Solu-Medrol and DuoNebs in the ED.  Hospitalist consulted for admission.   Review of Systems: As mentioned in the history of present illness. All other systems reviewed and are negative.  Past Medical History:  Diagnosis Date   COPD (chronic obstructive pulmonary disease) ([place])    Diabetes mellitus without complication ([place])    Past Surgical History:  Procedure Laterality Date   CARPAL TUNNEL RELEASE     COLONOSCOPY WITH PROPOFOL N/A 10/21/2019   Procedure: COLONOSCOPY WITH PROPOFOL;  Surgeon: Virgel Manifold, MD;  Location: [facility] ENDOSCOPY;   Service: Endoscopy;  Laterality: N/A;   DILATION AND CURETTAGE OF UTERUS     TUBAL LIGATION     Social History:  reports that she has been smoking cigarettes. She has a 84.00 pack-year smoking history. She has never used smokeless tobacco. She reports that she does not currently use alcohol. She reports that she does not use drugs.  Allergies  Allergen Reactions   Pregabalin Shortness Of Breath and Swelling    "Felt like I couldn't breathe and my tongue was swelling up"   Sulfamethoxazole-Trimethoprim Itching    History reviewed. No pertinent family history.  Prior to Admission medications   Medication Sig Start Date End Date Taking? Authorizing Provider  albuterol (VENTOLIN HFA) 108 (90 Base) MCG/ACT inhaler Inhale 1 puff into the lungs as needed. 09/11/19   [provider]  Aspirin-Salicylamide-Caffeine (BC HEADACHE POWDER PO) Take by mouth as needed.    [provider]  atorvastatin (LIPITOR) 20 MG tablet Take 1 tablet (20 mg total) by mouth daily. 12/23/21   Kate Sable, MD  baclofen (LIORESAL) 10 MG tablet Take 10-20 mg by mouth at bedtime as needed. Patient not taking: Reported on 09/20/2021 06/19/21   [provider]  cetirizine (ZYRTEC) 10 MG tablet Take 10 mg by mouth daily. 09/25/19   [provider]  cyclobenzaprine (FLEXERIL) 10 MG tablet Take 10 mg by mouth 2 (two) times daily as needed. 09/19/21   [provider]  DULoxetine (CYMBALTA) 30 MG capsule Take 90 mg by mouth daily.    [provider]  LINZESS 72 MCG capsule Take 72 mcg by mouth every morning. 03/22/21   [provider]  metFORMIN (GLUMETZA) 500 MG (MOD) 24 hr tablet Take 500 mg by mouth daily. 06/24/19   [provider]  OXYCONTIN 30 MG 12 hr tablet Take 1 tablet by mouth 2 (two) times daily. 06/26/21   [provider]  predniSONE (DELTASONE) 20 MG tablet Take 10 mg by mouth daily. Patient not taking: Reported on 09/20/2021 07/04/21    [provider]  TRELEGY ELLIPTA 100-62.5-25 MCG/ACT AEPB Take 1 puff by mouth daily. 07/04/21   [provider]    Physical Exam: Vitals:   03/16/22 2230 03/16/22 2247 03/16/22 2300 03/16/22 2330  BP: 120/71  106/67 108/69  Pulse:  (!) 108 (!) 109 (!) 107  Resp: '18 20 20 20  '$ Temp:      TempSrc:      SpO2:  96% 90%   Weight:      Height:       Physical Exam Vitals and nursing note reviewed.  Constitutional:      General: She is not in acute distress.    Interventions: Nasal cannula in place.     Comments: Conversational dyspnea  HENT:     Head: Normocephalic and atraumatic.  Cardiovascular:     Rate and Rhythm: Regular rhythm. Tachycardia present.     Heart sounds: Normal heart sounds.  Pulmonary:     Effort: Tachypnea present.     Breath sounds: Wheezing and rhonchi present.  Abdominal:     Palpations: Abdomen is soft.     Tenderness: There is no abdominal tenderness.  Neurological:     Mental Status: She is alert. Mental status is at baseline.     Labs on Admission: I have personally reviewed following labs and imaging studies  CBC: Recent Labs  Lab 03/16/22 2152  WBC 7.4  NEUTROABS 5.6  HGB 14.8  HCT 45.3  MCV 88.5  PLT 778   Basic Metabolic Panel: Recent Labs  Lab 03/16/22 2152  NA 135  K 3.5  CL 100  CO2 23  GLUCOSE 153*  BUN 18  CREATININE 0.88  CALCIUM 8.3*   GFR: Estimated Creatinine Clearance: 70.3 mL/min (by C-G formula based on SCr of 0.88 mg/dL). Liver Function Tests: Recent Labs  Lab 03/16/22 2152  AST 24  ALT 22  ALKPHOS 56  BILITOT 0.4  PROT 6.8  ALBUMIN 3.4*   No results for input(s): "LIPASE", "AMYLASE" in the last 168 hours. No results for input(s): "AMMONIA" in the last 168 hours. Coagulation Profile: No results for input(s): "INR", "PROTIME" in the last 168 hours. Cardiac Enzymes: No results for input(s): "CKTOTAL", "CKMB", "CKMBINDEX", "TROPONINI" in the last 168 hours. BNP (last 3 results) No  results for input(s): "PROBNP" in the last 8760 hours. HbA1C: No results for input(s): "HGBA1C" in the last 72 hours. CBG: No results for input(s): "GLUCAP" in the last 168 hours. Lipid Profile: No results for input(s): "CHOL", "HDL", "LDLCALC", "TRIG", "CHOLHDL", "LDLDIRECT" in the last 72 hours. Thyroid Function Tests: No results for input(s): "TSH", "T4TOTAL", "FREET4", "T3FREE", "THYROIDAB" in the last 72 hours. Anemia Panel: No results for input(s): "VITAMINB12", "FOLATE", "FERRITIN", "TIBC", "IRON", "RETICCTPCT" in the last 72 hours. Urine analysis: No results found for: "COLORURINE", "APPEARANCEUR", "LABSPEC", "PHURINE", "GLUCOSEU", "HGBUR", "BILIRUBINUR", "KETONESUR", "PROTEINUR", "UROBILINOGEN", "NITRITE", "LEUKOCYTESUR"  Radiological Exams on Admission: DG Chest 2 View  Result Date: 03/16/2022 CLINICAL DATA:  Shortness of breath EXAM: CHEST - 2 VIEW  COMPARISON:  CT chest dated March 10, 2018 FINDINGS: The heart size and mediastinal contours are within normal limits. Atherosclerotic calcification of the aortic arch. Hyperinflated lungs suggesting COPD without evidence of focal consolidation or pleural effusion. Thoracic spondylosis. No acute osseous abnormality. IMPRESSION: 1. No active cardiopulmonary disease. 2. Hyperinflated lungs suggesting COPD. Electronically Signed   By: Keane Police D.O.   On: 03/16/2022 22:18     Data Reviewed: Relevant notes from primary care and specialist visits, past discharge summaries as available in EHR, including Care Everywhere. Prior diagnostic testing as pertinent to current admission diagnoses Updated medications and problem lists for reconciliation ED course, including vitals, labs, imaging, treatment and response to treatment Triage notes, nursing and pharmacy notes and ED provider's notes Notable results as noted in HPI   Assessment and Plan: COVID-19 virus infection Sepsis secondary to COVID-19 infection COPD exacerbation Acute  respiratory failure with hypoxia Sepsis criteria includes fever, tachycardia and tachypnea with respiratory failure requiring O2 at 3 L WBC normal as well as lactic acid.  Will add procalcitonin Follow respiratory viral panel Sepsis fluids Albuterol every 6 and as needed, IV steroids COVID precautions Follow blood cultures  Coronary artery disease CAD, three-vessel coronary calcification on chest CT 2023, seen by cardiology in consultation.  Normal echo and Myoview 04/2021  continue aspirin, and Lipitor 20 mg daily   Tobacco use disorder Nicotine patch      DVT prophylaxis: Lovenox  Consults: none  Advance Care Planning: full code  Family Communication: Daughter at bedside  Disposition Plan: Back to previous home environment  Severity of Illness: The appropriate patient status for this patient is INPATIENT. Inpatient status is judged to be reasonable and necessary in order to provide the required intensity of service to ensure the patient's safety. The patient's presenting symptoms, physical exam findings, and initial radiographic and laboratory data in the context of their chronic comorbidities is felt to place them at high risk for further clinical deterioration. Furthermore, it is not anticipated that the patient will be medically stable for discharge from the hospital within 2 midnights of admission.   * I certify that at the point of admission it is my clinical judgment that the patient will require inpatient hospital care spanning beyond 2 midnights from the point of admission due to high intensity of service, high risk for further deterioration and high frequency of surveillance required.*  Author: Athena Masse, MD 03/16/2022 11:37 PM  For on call review [email or web address].

## 2022-03-16 NOTE — H&P (Incomplete)
History and Physical    Patient: Laurie Berg:[id number] DOB: 01-Aug-1960 DOA: 03/16/2022 DOS: the patient was seen and examined on 03/16/2022 PCP: Marguerita Merles, MD  Patient coming from: Home  Chief Complaint:  Chief Complaint  Patient presents with  . Shortness of Breath    HPI: Laurie Berg is a 62 y.o. female with medical history significant for CAD (cor Ca2+ on chest CT), diabetes, current smoker x40+ years COPD not on oxygen who presents to the ED with a several day history of progressively worsening shortness of breath not responding to home inhalers.  She was previously treated for outpatient pneumonia and completed antibiotics and a course of steroids a few days prior.  She denies chest pain, vomiting, abdominal pain or diarrhea.  She was sick to 86% with EMS requiring 4 L to maintain sats in the mid 90s. ED course and data Review: Febrile to 102.9 on arrival with heart rate 116 and respirations 24 with O2 sat 80%, currently requiring 3 L to maintain sats in the mid 90s.  BP soft around 108/69.  Labs for the most part unremarkable with normal WBC and lactic acid and.  Respiratory viral panel still pending.  Troponin was 8. EKG, personally viewed and interpreted showed sinus tachycardia at 115 with nonspecific ST-T wave changes.  Chest x-ray was nonacute showing hyperinflated lungs suggesting COPD.  Patient was treated with Solu-Medrol and DuoNebs in the ED.  Hospitalist consulted for admission.   Review of Systems: As mentioned in the history of present illness. All other systems reviewed and are negative.  Past Medical History:  Diagnosis Date  . COPD (chronic obstructive pulmonary disease) ([place])   . Diabetes mellitus without complication [facility])    Past Surgical History:  Procedure Laterality Date  . CARPAL TUNNEL RELEASE    . COLONOSCOPY WITH PROPOFOL N/A 10/21/2019   Procedure: COLONOSCOPY WITH PROPOFOL;  Surgeon: Virgel Manifold, MD;  Location: [facility] ENDOSCOPY;   Service: Endoscopy;  Laterality: N/A;  . DILATION AND CURETTAGE OF UTERUS    . TUBAL LIGATION     Social History:  reports that she has been smoking cigarettes. She has a 84.00 pack-year smoking history. She has never used smokeless tobacco. She reports that she does not currently use alcohol. She reports that she does not use drugs.  Allergies  Allergen Reactions  . Pregabalin Shortness Of Breath and Swelling    "Felt like I couldn't breathe and my tongue was swelling up"  . Sulfamethoxazole-Trimethoprim Itching    History reviewed. No pertinent family history.  Prior to Admission medications   Medication Sig Start Date End Date Taking? Authorizing Provider  albuterol (VENTOLIN HFA) 108 (90 Base) MCG/ACT inhaler Inhale 1 puff into the lungs as needed. 09/11/19   [provider]  Aspirin-Salicylamide-Caffeine (BC HEADACHE POWDER PO) Take by mouth as needed.    [provider]  atorvastatin (LIPITOR) 20 MG tablet Take 1 tablet (20 mg total) by mouth daily. 12/23/21   Kate Sable, MD  baclofen (LIORESAL) 10 MG tablet Take 10-20 mg by mouth at bedtime as needed. Patient not taking: Reported on 09/20/2021 06/19/21   [provider]  cetirizine (ZYRTEC) 10 MG tablet Take 10 mg by mouth daily. 09/25/19   [provider]  cyclobenzaprine (FLEXERIL) 10 MG tablet Take 10 mg by mouth 2 (two) times daily as needed. 09/19/21   [provider]  DULoxetine (CYMBALTA) 30 MG capsule Take 90 mg by mouth daily.    [provider]  LINZESS 72 MCG capsule Take 72 mcg by mouth every morning. 03/22/21   [provider]  metFORMIN (GLUMETZA) 500 MG (MOD) 24 hr tablet Take 500 mg by mouth daily. 06/24/19   [provider]  OXYCONTIN 30 MG 12 hr tablet Take 1 tablet by mouth 2 (two) times daily. 06/26/21   [provider]  predniSONE (DELTASONE) 20 MG tablet Take 10 mg by mouth daily. Patient not taking: Reported on 09/20/2021 07/04/21    [provider]  TRELEGY ELLIPTA 100-62.5-25 MCG/ACT AEPB Take 1 puff by mouth daily. 07/04/21   [provider]    Physical Exam: Vitals:   03/16/22 2230 03/16/22 2247 03/16/22 2300 03/16/22 2330  BP: 120/71  106/67 108/69  Pulse:  (!) 108 (!) 109 (!) 107  Resp: '18 20 20 20  '$ Temp:      TempSrc:      SpO2:  96% 90%   Weight:      Height:       Physical Exam  Labs on Admission: I have personally reviewed following labs and imaging studies  CBC: Recent Labs  Lab 03/16/22 2152  WBC 7.4  NEUTROABS 5.6  HGB 14.8  HCT 45.3  MCV 88.5  PLT 314   Basic Metabolic Panel: Recent Labs  Lab 03/16/22 2152  NA 135  K 3.5  CL 100  CO2 23  GLUCOSE 153*  BUN 18  CREATININE 0.88  CALCIUM 8.3*   GFR: Estimated Creatinine Clearance: 70.3 mL/min (by C-G formula based on SCr of 0.88 mg/dL). Liver Function Tests: Recent Labs  Lab 03/16/22 2152  AST 24  ALT 22  ALKPHOS 56  BILITOT 0.4  PROT 6.8  ALBUMIN 3.4*   No results for input(s): "LIPASE", "AMYLASE" in the last 168 hours. No results for input(s): "AMMONIA" in the last 168 hours. Coagulation Profile: No results for input(s): "INR", "PROTIME" in the last 168 hours. Cardiac Enzymes: No results for input(s): "CKTOTAL", "CKMB", "CKMBINDEX", "TROPONINI" in the last 168 hours. BNP (last 3 results) No results for input(s): "PROBNP" in the last 8760 hours. HbA1C: No results for input(s): "HGBA1C" in the last 72 hours. CBG: No results for input(s): "GLUCAP" in the last 168 hours. Lipid Profile: No results for input(s): "CHOL", "HDL", "LDLCALC", "TRIG", "CHOLHDL", "LDLDIRECT" in the last 72 hours. Thyroid Function Tests: No results for input(s): "TSH", "T4TOTAL", "FREET4", "T3FREE", "THYROIDAB" in the last 72 hours. Anemia Panel: No results for input(s): "VITAMINB12", "FOLATE", "FERRITIN", "TIBC", "IRON", "RETICCTPCT" in the last 72 hours. Urine analysis: No results found for: "COLORURINE", "APPEARANCEUR",  "LABSPEC", "PHURINE", "GLUCOSEU", "HGBUR", "BILIRUBINUR", "KETONESUR", "PROTEINUR", "UROBILINOGEN", "NITRITE", "LEUKOCYTESUR"  Radiological Exams on Admission: DG Chest 2 View  Result Date: 03/16/2022 CLINICAL DATA:  Shortness of breath EXAM: CHEST - 2 VIEW COMPARISON:  CT chest dated March 10, 2018 FINDINGS: The heart size and mediastinal contours are within normal limits. Atherosclerotic calcification of the aortic arch. Hyperinflated lungs suggesting COPD without evidence of focal consolidation or pleural effusion. Thoracic spondylosis. No acute osseous abnormality. IMPRESSION: 1. No active cardiopulmonary disease. 2. Hyperinflated lungs suggesting COPD. Electronically Signed   By: Keane Police D.O.   On: 03/16/2022 22:18     Data Reviewed: Relevant notes from primary care and specialist visits, past discharge summaries as available in EHR, including Care Everywhere. Prior diagnostic testing as pertinent to current admission diagnoses Updated medications and problem lists for reconciliation ED course, including vitals, labs, imaging, treatment and response to treatment Triage notes, nursing and pharmacy  notes and ED provider's notes Notable results as noted in HPI   Assessment and Plan: COPD with acute bronchitis ([place]) Sepsis Acute respiratory failure with hypoxia Sepsis criteria includes fever, tachycardia and tachypnea with respiratory failure requiring O2 at 3 L WBC normal as well as lactic acid.  Will add procalcitonin Follow respiratory viral panel Sepsis fluids Scheduled and as needed nebulized bronchodilator treatments, IV steroids Follow blood cultures  Coronary artery disease CAD, three-vessel coronary calcification on chest CT 2023, seen by cardiology in consultation.  Normal echo and Myoview 04/2021  continue aspirin, and Lipitor 20 mg daily   Tobacco use disorder Nicotine patch      DVT prophylaxis: Lovenox  Consults: none  Advance Care Planning: full  code  Family Communication: none  Disposition Plan: Back to previous home environment  Severity of Illness: The appropriate patient status for this patient is INPATIENT. Inpatient status is judged to be reasonable and necessary in order to provide the required intensity of service to ensure the patient's safety. The patient's presenting symptoms, physical exam findings, and initial radiographic and laboratory data in the context of their chronic comorbidities is felt to place them at high risk for further clinical deterioration. Furthermore, it is not anticipated that the patient will be medically stable for discharge from the hospital within 2 midnights of admission.   * I certify that at the point of admission it is my clinical judgment that the patient will require inpatient hospital care spanning beyond 2 midnights from the point of admission due to high intensity of service, high risk for further deterioration and high frequency of surveillance required.*  Author: Athena Masse, MD 03/16/2022 11:37 PM  For on call review [email or web address].

## 2022-03-16 NOTE — ED Notes (Signed)
ED Provider at bedside. 

## 2022-03-16 NOTE — ED Triage Notes (Signed)
Pt presents via POV with complaints of SOB that started today - Hx of COPD. Pts RA sats were 80% - no increased WOB. Pt placed on 4L [place] improved to 86% - increased to 6L [place] and sats improved to 93%. Pt has had some intermittent confusion today, per family. Denies CP, fevers, chills, cough, N/V/D.

## 2022-03-16 NOTE — Assessment & Plan Note (Addendum)
CAD, three-vessel coronary calcification on chest CT 2023, seen by cardiology in consultation.  Normal echo and Myoview 6-08/2021  continue aspirin, and Lipitor 20 mg daily

## 2022-03-17 ENCOUNTER — Encounter: Payer: Self-pay | Admitting: Internal Medicine

## 2022-03-17 DIAGNOSIS — J9601 Acute respiratory failure with hypoxia: Secondary | ICD-10-CM | POA: Diagnosis not present

## 2022-03-17 DIAGNOSIS — F17200 Nicotine dependence, unspecified, uncomplicated: Secondary | ICD-10-CM

## 2022-03-17 DIAGNOSIS — U071 COVID-19: Secondary | ICD-10-CM | POA: Diagnosis not present

## 2022-03-17 DIAGNOSIS — J441 Chronic obstructive pulmonary disease with (acute) exacerbation: Secondary | ICD-10-CM | POA: Diagnosis not present

## 2022-03-17 LAB — HEMOGLOBIN A1C
Hgb A1c MFr Bld: 8.9 % — ABNORMAL HIGH (ref 4.8–5.6)
Mean Plasma Glucose: 208.73 mg/dL

## 2022-03-17 LAB — PROTIME-INR
INR: 1.1 (ref 0.8–1.2)
Prothrombin Time: 14 seconds (ref 11.4–15.2)

## 2022-03-17 LAB — PROCALCITONIN: Procalcitonin: 0.19 ng/mL

## 2022-03-17 LAB — CBG MONITORING, ED
Glucose-Capillary: 302 mg/dL — ABNORMAL HIGH (ref 70–99)
Glucose-Capillary: 351 mg/dL — ABNORMAL HIGH (ref 70–99)

## 2022-03-17 LAB — FERRITIN: Ferritin: 249 ng/mL (ref 11–307)

## 2022-03-17 LAB — GLUCOSE, CAPILLARY
Glucose-Capillary: 220 mg/dL — ABNORMAL HIGH (ref 70–99)
Glucose-Capillary: 399 mg/dL — ABNORMAL HIGH (ref 70–99)

## 2022-03-17 LAB — LACTIC ACID, PLASMA: Lactic Acid, Venous: 0.8 mmol/L (ref 0.5–1.9)

## 2022-03-17 LAB — HIV ANTIBODY (ROUTINE TESTING W REFLEX): HIV Screen 4th Generation wRfx: NONREACTIVE

## 2022-03-17 LAB — CORTISOL-AM, BLOOD: Cortisol - AM: 14.1 ug/dL (ref 6.7–22.6)

## 2022-03-17 LAB — TROPONIN I (HIGH SENSITIVITY): Troponin I (High Sensitivity): 8 ng/L (ref <18)

## 2022-03-17 MED ORDER — ADULT MULTIVITAMIN W/MINERALS CH
1.0000 | ORAL_TABLET | Freq: Every day | ORAL | Status: DC
  Administered 2022-03-17 – 2022-03-21 (×5): 1 via ORAL
  Filled 2022-03-17 (×5): qty 1

## 2022-03-17 MED ORDER — PREDNISONE 20 MG PO TABS
50.0000 mg | ORAL_TABLET | Freq: Every day | ORAL | Status: DC
  Administered 2022-03-18 – 2022-03-19 (×2): 50 mg via ORAL
  Filled 2022-03-17 (×3): qty 1

## 2022-03-17 MED ORDER — NIRMATRELVIR/RITONAVIR (PAXLOVID)TABLET
3.0000 | ORAL_TABLET | Freq: Two times a day (BID) | ORAL | Status: DC
  Administered 2022-03-17 – 2022-03-21 (×9): 3 via ORAL
  Filled 2022-03-17: qty 30

## 2022-03-17 MED ORDER — IPRATROPIUM-ALBUTEROL 20-100 MCG/ACT IN AERS: 1.0000 | INHALATION_SPRAY | Freq: Four times a day (QID) | RESPIRATORY_TRACT | Status: DC

## 2022-03-17 MED ORDER — HYDROCOD POLI-CHLORPHE POLI ER 10-8 MG/5ML PO SUER
5.0000 mL | Freq: Two times a day (BID) | ORAL | Status: DC | PRN
  Administered 2022-03-19 – 2022-03-21 (×4): 5 mL via ORAL
  Filled 2022-03-17 (×4): qty 5

## 2022-03-17 MED ORDER — PREDNISONE 20 MG PO TABS: 40.0000 mg | ORAL_TABLET | Freq: Every day | ORAL | Status: DC

## 2022-03-17 MED ORDER — IPRATROPIUM-ALBUTEROL 0.5-2.5 (3) MG/3ML IN SOLN: 3.0000 mL | RESPIRATORY_TRACT | Status: DC | PRN

## 2022-03-17 MED ORDER — CYCLOBENZAPRINE HCL 10 MG PO TABS
10.0000 mg | ORAL_TABLET | Freq: Two times a day (BID) | ORAL | Status: DC | PRN
  Administered 2022-03-17 – 2022-03-18 (×2): 10 mg via ORAL
  Filled 2022-03-17 (×3): qty 1

## 2022-03-17 MED ORDER — SODIUM CHLORIDE 0.9 % IV SOLN
200.0000 mg | Freq: Once | INTRAVENOUS | Status: AC
  Administered 2022-03-17: 200 mg via INTRAVENOUS
  Filled 2022-03-17: qty 200

## 2022-03-17 MED ORDER — FUROSEMIDE 10 MG/ML IJ SOLN
20.0000 mg | Freq: Once | INTRAMUSCULAR | Status: AC
  Administered 2022-03-17: 20 mg via INTRAVENOUS
  Filled 2022-03-17: qty 4

## 2022-03-17 MED ORDER — ATORVASTATIN CALCIUM 20 MG PO TABS: 20.0000 mg | ORAL_TABLET | Freq: Every day | ORAL | Status: DC

## 2022-03-17 MED ORDER — DEXAMETHASONE 6 MG PO TABS: 6.0000 mg | ORAL_TABLET | Freq: Every day | ORAL | Status: DC

## 2022-03-17 MED ORDER — DULOXETINE HCL 30 MG PO CPEP
90.0000 mg | ORAL_CAPSULE | Freq: Every day | ORAL | Status: DC
  Administered 2022-03-17 – 2022-03-21 (×5): 90 mg via ORAL
  Filled 2022-03-17: qty 1
  Filled 2022-03-17 (×4): qty 3

## 2022-03-17 MED ORDER — METFORMIN HCL ER 500 MG PO TB24
500.0000 mg | ORAL_TABLET | Freq: Every day | ORAL | Status: DC
  Administered 2022-03-18 – 2022-03-19 (×2): 500 mg via ORAL
  Filled 2022-03-17 (×2): qty 1

## 2022-03-17 MED ORDER — INSULIN ASPART 100 UNIT/ML IJ SOLN
0.0000 [IU] | Freq: Every day | INTRAMUSCULAR | Status: DC
  Administered 2022-03-17 – 2022-03-18 (×2): 5 [IU] via SUBCUTANEOUS
  Administered 2022-03-20: 3 [IU] via SUBCUTANEOUS
  Filled 2022-03-17 (×3): qty 1

## 2022-03-17 MED ORDER — GLUCERNA SHAKE PO LIQD
237.0000 mL | Freq: Three times a day (TID) | ORAL | Status: DC
  Administered 2022-03-17 – 2022-03-21 (×11): 237 mL via ORAL

## 2022-03-17 MED ORDER — INSULIN ASPART 100 UNIT/ML IJ SOLN
0.0000 [IU] | Freq: Three times a day (TID) | INTRAMUSCULAR | Status: DC
  Administered 2022-03-17: 15 [IU] via SUBCUTANEOUS
  Administered 2022-03-17: 5 [IU] via SUBCUTANEOUS
  Administered 2022-03-18 (×2): 8 [IU] via SUBCUTANEOUS
  Administered 2022-03-18: 5 [IU] via SUBCUTANEOUS
  Administered 2022-03-19: 3 [IU] via SUBCUTANEOUS
  Administered 2022-03-19: 11 [IU] via SUBCUTANEOUS
  Filled 2022-03-17 (×7): qty 1

## 2022-03-17 MED ORDER — GUAIFENESIN-DM 100-10 MG/5ML PO SYRP
10.0000 mL | ORAL_SOLUTION | ORAL | Status: DC | PRN
  Administered 2022-03-19 (×2): 10 mL via ORAL
  Filled 2022-03-17 (×2): qty 10

## 2022-03-17 MED ORDER — ZINC SULFATE 220 (50 ZN) MG PO CAPS
220.0000 mg | ORAL_CAPSULE | Freq: Every day | ORAL | Status: DC
  Administered 2022-03-17 – 2022-03-21 (×5): 220 mg via ORAL
  Filled 2022-03-17 (×5): qty 1

## 2022-03-17 MED ORDER — AZITHROMYCIN 250 MG PO TABS
250.0000 mg | ORAL_TABLET | Freq: Every day | ORAL | Status: DC
  Administered 2022-03-17 – 2022-03-21 (×5): 250 mg via ORAL
  Filled 2022-03-17 (×5): qty 1

## 2022-03-17 MED ORDER — METHYLPREDNISOLONE SODIUM SUCC 40 MG IJ SOLR
40.0000 mg | Freq: Two times a day (BID) | INTRAMUSCULAR | Status: DC
  Administered 2022-03-17: 40 mg via INTRAVENOUS
  Filled 2022-03-17: qty 1

## 2022-03-17 MED ORDER — ALBUTEROL SULFATE HFA 108 (90 BASE) MCG/ACT IN AERS
2.0000 | INHALATION_SPRAY | Freq: Four times a day (QID) | RESPIRATORY_TRACT | Status: DC
  Administered 2022-03-17 – 2022-03-20 (×16): 2 via RESPIRATORY_TRACT

## 2022-03-17 MED ORDER — VITAMIN C 500 MG PO TABS
500.0000 mg | ORAL_TABLET | Freq: Every day | ORAL | Status: DC
  Administered 2022-03-17 – 2022-03-21 (×5): 500 mg via ORAL
  Filled 2022-03-17 (×5): qty 1

## 2022-03-17 MED ORDER — SODIUM CHLORIDE 0.9 % IV SOLN: 100.0000 mg | Freq: Every day | INTRAVENOUS | Status: DC

## 2022-03-17 NOTE — Consult Note (Signed)
PULMONOLOGY         Date: 03/17/2022,   MRN# [id number] Laurie Berg 06-13-1960     AdmissionWeight: 79.6 kg                 CurrentWeight: 79.6 kg  Referring provider: Dr. Lydia Guiles   CHIEF COMPLAINT:   Acute on chronic hypoxemic respiratory failure   HISTORY OF PRESENT ILLNESS   This is a very pleasant 62 year old female known to me from outpatient pulmonology clinic who was lifelong smoker with history of COPD and chronic hypoxemia.  He also has a background history of chronic pain syndrome, occult positive stools, colonic polyps, cervical radiculopathy history of sepsis and COVID-19.  Patient still currently smokes with minimal improvement post inhaler therapy outpatient.  Recent treatment with antimicrobials and steroids for acute exacerbation of COPD.  Patient came in with hypoxemia requiring 4 L supplemental O2 to reach 90% SpO2.  Also noted to have febrile flulike illness.  PCR for COVID came back positive.  PCCM consultation for additional evaluation management.  Reviewed previous CT chest from March 10, 2022 with findings of centrilobular emphysema mild congestion bilaterally and bronchitic COPD phenotype.   PAST MEDICAL HISTORY   Past Medical History:  Diagnosis Date   COPD (chronic obstructive pulmonary disease) ([place])    Diabetes mellitus without complication ([place])      SURGICAL HISTORY   Past Surgical History:  Procedure Laterality Date   CARPAL TUNNEL RELEASE     COLONOSCOPY WITH PROPOFOL N/A 10/21/2019   Procedure: COLONOSCOPY WITH PROPOFOL;  Surgeon: Virgel Manifold, MD;  Location: [facility];  Service: Endoscopy;  Laterality: N/A;   DILATION AND CURETTAGE OF UTERUS     TUBAL LIGATION       FAMILY HISTORY   History reviewed. No pertinent family history.   SOCIAL HISTORY   Social History   Tobacco Use   Smoking status: Every Day    Packs/day: 1.75    Years: 48.00    Total pack years: 84.00    Types: Cigarettes    Smokeless tobacco: Never  Vaping Use   Vaping Use: Never used  Substance Use Topics   Alcohol use: Not Currently   Drug use: Never     MEDICATIONS    Home Medication:  Current Outpatient Rx   Order #: [id number] Class: Normal   Order #: [id number] Class: Historical Med   Order #: [id number] Class: Historical Med   Order #: [id number] Class: Historical Med   Order #: [id number] Class: Historical Med   Order #: [id number] Class: Historical Med   Order #: [id number] Class: Historical Med   Order #: [id number] Class: Historical Med   Order #: [id number] Class: Historical Med   Order #: [id number] Class: Historical Med   Order #: [id number] Class: Historical Med   Order #: [id number] Class: Historical Med   Order #: [id number] Class: Historical Med   Order #: [id number] Class: Historical Med   Order #: [id number] Class: Historical Med   Order #: [id number] Class: Historical Med    Current Medication:  Current Facility-Administered Medications:    acetaminophen (TYLENOL) tablet 650 mg, 650 mg, Oral, Q6H PRN **OR** acetaminophen (TYLENOL) suppository 650 mg, 650 mg, Rectal, Q6H PRN, Athena Masse, MD   albuterol (VENTOLIN HFA) 108 (90 Base) MCG/ACT inhaler 2 puff, 2 puff, Inhalation, Q2H PRN, Athena Masse, MD   albuterol (VENTOLIN HFA) 108 (90 Base) MCG/ACT inhaler 2 puff, 2 puff, Inhalation, Q6H, Athena Masse, MD, 2 puff at 03/17/22 0935   chlorpheniramine-HYDROcodone (TUSSIONEX) 10-8 MG/5ML suspension 5  mL, 5 mL, Oral, Q12H PRN, Athena Masse, MD   enoxaparin (LOVENOX) injection 40 mg, 40 mg, Subcutaneous, QHS, Athena Masse, MD, 40 mg at 03/17/22 0019   guaiFENesin (MUCINEX) 12 hr tablet 600 mg, 600 mg, Oral, BID, Athena Masse, MD, 600 mg at 03/17/22 0019   guaiFENesin-dextromethorphan (ROBITUSSIN DM) 100-10 MG/5ML syrup 10 mL, 10 mL, Oral, Q4H PRN, Athena Masse, MD   methylPREDNISolone sodium succinate (SOLU-MEDROL) 40 mg/mL injection 40 mg, 40 mg, Intravenous, Q12H, 40 mg at 03/17/22 0122  **FOLLOWED BY** [START ON 03/18/2022] predniSONE (DELTASONE) tablet 40 mg, 40 mg, Oral, Q breakfast, [name], RPH   nicotine (NICODERM CQ - dosed in mg/24 hours) patch 21 mg, 21 mg, Transdermal, Daily, [name], MD, 21 mg at 03/17/22 7262   nirmatrelvir/ritonavir (PAXLOVID) 3 tablet, 3 tablet, Oral, BID, [name], MD, 3 tablet at 03/17/22 0936   ondansetron (ZOFRAN) tablet 4 mg, 4 mg, Oral, Q6H PRN **OR** ondansetron (ZOFRAN) injection 4 mg, 4 mg, Intravenous, Q6H PRN, Athena Masse, MD   oxyCODONE (Oxy IR/ROXICODONE) immediate release tablet 5 mg, 5 mg, Oral, Q4H PRN, Athena Masse, MD  Current Outpatient Medications:    atorvastatin (LIPITOR) 20 MG tablet, Take 1 tablet (20 mg total) by mouth daily., Disp: 30 tablet, Rfl: 2   cetirizine (ZYRTEC) 10 MG tablet, Take 10 mg by mouth daily., Disp: , Rfl:    DULoxetine (CYMBALTA) 30 MG capsule, Take 90 mg by mouth daily., Disp: , Rfl:    metFORMIN (GLUMETZA) 500 MG (MOD) 24 hr tablet, Take 500 mg by mouth daily., Disp: , Rfl:    naloxone (NARCAN) nasal spray 4 mg/0.1 mL, One spray in either nostril once for known/suspected opioid overdose. May repeat every 2-3 minutes in alternating nostril til EMS arrives, Disp: , Rfl:    nystatin (MYCOSTATIN) 100000 UNIT/ML suspension, Apply 2 ml to inside of mouth 4 times a day for 7 days. Continue 3 days after white patches have disappeared., Disp: , Rfl:    TRELEGY ELLIPTA 100-62.5-25 MCG/ACT AEPB, Take 1 puff by mouth daily., Disp: , Rfl:    albuterol (VENTOLIN HFA) 108 (90 Base) MCG/ACT inhaler, Inhale 1 puff into the lungs as needed., Disp: , Rfl:    Aspirin-Salicylamide-Caffeine (BC HEADACHE POWDER PO), Take by mouth as needed., Disp: , Rfl:    baclofen (LIORESAL) 10 MG tablet, Take 10-20 mg by mouth at bedtime as needed. (Patient not taking: Reported on 09/20/2021), Disp: , Rfl:    cyclobenzaprine (FLEXERIL) 10 MG tablet, Take 10 mg by mouth 2 (two) times daily as needed for muscle spasms.,  Disp: , Rfl:    ipratropium-albuterol (DUONEB) 0.5-2.5 (3) MG/3ML SOLN, Take 3 mLs by nebulization daily., Disp: , Rfl:    LINZESS 72 MCG capsule, Take 72 mcg by mouth every morning., Disp: , Rfl:    oxyCODONE-acetaminophen (PERCOCET/ROXICET) 5-325 MG tablet, Take 1 tablet by mouth every 6 (six) hours as needed., Disp: , Rfl:    OXYCONTIN 30 MG 12 hr tablet, Take 1 tablet by mouth 2 (two) times daily., Disp: , Rfl:    predniSONE (DELTASONE) 20 MG tablet, Take 10 mg by mouth daily. (Patient not taking: Reported on 09/20/2021), Disp: , Rfl:     ALLERGIES   Pregabalin and Sulfamethoxazole-trimethoprim     REVIEW OF SYSTEMS    Review of Systems:  Gen:  Denies  fever, sweats, chills weigh loss  HEENT: Denies blurred vision, double vision, ear pain, eye pain, hearing  loss, nose bleeds, sore throat Cardiac:  No dizziness, chest pain or heaviness, chest tightness,edema Resp:   reports dyspnea chronically  Gi: Denies swallowing difficulty, stomach pain, nausea or vomiting, diarrhea, constipation, bowel incontinence Gu:  Denies bladder incontinence, burning urine Ext:   Denies Joint pain, stiffness or swelling Skin: Denies  skin rash, easy bruising or bleeding or hives Endoc:  Denies polyuria, polydipsia , polyphagia or weight change Psych:   Denies depression, insomnia or hallucinations   Other:  All other systems negative   VS: BP 119/83   Pulse 80   Temp 97.6 F (36.4 C) (Oral)   Resp 18   Ht '5\' 4"'$  (1.626 m)   Wt 79.6 kg   SpO2 96%   BMI 30.12 kg/m      PHYSICAL EXAM    GENERAL:NAD, no fevers, chills, no weakness no fatigue HEAD: Normocephalic, atraumatic.  EYES: Pupils equal, round, reactive to light. Extraocular muscles intact. No scleral icterus.  MOUTH: Moist mucosal membrane. Dentition intact. No abscess noted.  EAR, NOSE, THROAT: Clear without exudates. No external lesions.  NECK: Supple. No thyromegaly. No nodules. No JVD.  PULMONARY: decreased breath sounds  with mild rhonchi worse at bases bilaterally.  CARDIOVASCULAR: S1 and S2. Regular rate and rhythm. No murmurs, rubs, or gallops. No edema. Pedal pulses 2+ bilaterally.  GASTROINTESTINAL: Soft, nontender, nondistended. No masses. Positive bowel sounds. No hepatosplenomegaly.  MUSCULOSKELETAL: No swelling, clubbing, or edema. Range of motion full in all extremities.  NEUROLOGIC: Cranial nerves II through XII are intact. No gross focal neurological deficits. Sensation intact. Reflexes intact.  SKIN: No ulceration, lesions, rashes, or cyanosis. Skin warm and dry. Turgor intact.  PSYCHIATRIC: Mood, affect within normal limits. The patient is awake, alert and oriented x 3. Insight, judgment intact.       IMAGING     ASSESSMENT/PLAN      Acute COVID19 viral LRTI with COPD exacerbation -Remdesevir antiviral - has been dcd -vitamin C -zinc -switching solumedrol to prednisone 50 mg with tapering by '5mg'$  daily   -Diuresis - Lasix 20 IV daily - monitor UOP - utilize external urinary catheter if possible -encourage to use IS and Acapella device for bronchopulmonary hygiene when able -consider  -CRP -supportive care with ICU telemetry monitoring -PT/OT when possible -procalcitonin, CRP and ferritin trending -agree with nebulizer therapy routine q6h     Thank you for allowing me to participate in the care of this patient.   Patient/Family are satisfied with care plan and all questions have been answered.    Provider disclosure: Patient with at least one acute or chronic illness or injury that poses a threat to life or bodily function and is being managed actively during this encounter.  All of the below services have been performed independently by signing provider:  review of prior documentation from internal and or external health records.  Review of previous and current lab results.  Interview and comprehensive assessment during patient visit today. Review of current and previous chest  radiographs/CT scans. Discussion of management and test interpretation with health care team and patient/family.   This document was prepared using Dragon voice recognition software and may include unintentional dictation errors.     Ottie Glazier, M.D.  Division of Pulmonary & Critical Care Medicine

## 2022-03-17 NOTE — Progress Notes (Signed)
Initial Nutrition Assessment  DOCUMENTATION CODES:   Obesity unspecified  INTERVENTION:   -Change diet to carb modified secondary to uncontrolled DM and hyperglycemia -MVI with minerals daily -Glucerna Shake po TID, each supplement provides 220 kcal and 10 grams of protein  -Provided "Carbohydrate Counting for People with DIabetes" handout from [facility] Nutrition Care Manual; attached to AVS/ discharge summary -RD referred pt to [place] for further reinforcement and support  NUTRITION DIAGNOSIS:   Increased nutrient needs related to chronic illness (COPD) as evidenced by estimated needs.  GOAL:   Patient will meet greater than or equal to 90% of their needs  MONITOR:   PO intake, Supplement acceptance  REASON FOR ASSESSMENT:   Consult Assessment of nutrition requirement/status  ASSESSMENT:   Pt with medical history significant for CAD (cor Ca2+ on chest CT), diabetes, current smoker x40+ years COPD not on oxygen who presents with a several day history of progressively worsening shortness of breath not responding to home inhalers.  Pt admitted with COVID-19, sepsis, and COPD.   Pt unavailable at time of visit. RD unable to obtain further nutrition-related history or complete nutrition-focused physical exam at this time.     Pt currently on a regular diet. No meal completion data available to assess at this time.   Reviewed wt hx; unsure if wt is an accurate wt. Will obtain new wt to better assess weight changes.   Medications reviewed and include prednisone.   Lab Results  Component Value Date   HGBA1C 8.9 (H) 03/16/2022   PTA DM medications are 500 mg metformin daily.   Labs reviewed: CBGS: 302-351 (inpatient orders for glycemic control are 0-15 units insulin aspart TID with meals, 0-5 units insulin aspart daily at bedtime, and 500 mg metformin daily).    Diet Order:   Diet Order             Diet regular Room service  appropriate? Yes; Fluid consistency: Thin  Diet effective now                   EDUCATION NEEDS:   Not appropriate for education at this time  Skin:  Skin Assessment: Reviewed RN Assessment  Last BM:  Unknown  Height:   Ht Readings from Last 1 Encounters:  03/16/22 '5\' 4"'$  (1.626 m)    Weight:   Wt Readings from Last 1 Encounters:  03/16/22 79.6 kg    Ideal Body Weight:  54.5 kg  BMI:  Body mass index is 30.12 kg/m.  Estimated Nutritional Needs:   Kcal:  1650-1850  Protein:  85-100 grams  Fluid:  > 1.6 L    Loistine Chance, RD, LDN, [place] Registered Dietitian II Certified Diabetes Care and Education Specialist Please refer to [facility] for RD and/or RD on-call/weekend/after hours pager

## 2022-03-17 NOTE — Assessment & Plan Note (Signed)
Sepsis secondary to COVID-19 infection COPD exacerbation Acute respiratory failure with hypoxia Sepsis criteria includes fever, tachycardia and tachypnea with respiratory failure requiring O2 at 3 L WBC normal as well as lactic acid.  Will add procalcitonin Follow respiratory viral panel Sepsis fluids Albuterol every 6 and as needed, IV steroids COVID precautions Follow blood cultures

## 2022-03-17 NOTE — Progress Notes (Signed)
[facility] [place] NAME: Laurie Berg    MR#:  [id number]  DATE OF BIRTH:  17-Oct-1960  SUBJECTIVE:  daughter was at bedside. Patient came in with fever of 102.6 and shortness of breath. Found to be covid positive. Patient currently feels better. She is on 2 L nasal count oxygen. Does not wear oxygen at home. Patient reports smoking about 1 1/2 to 2 packs daily.   VITALS:  Blood pressure (!) 129/99, pulse 82, temperature 97.6 F (36.4 C), temperature source Oral, resp. rate 20, height '5\' 4"'$  (1.626 m), weight 79.6 kg, SpO2 96 %.  PHYSICAL EXAMINATION:   GENERAL:  62 y.o.-year-old patient with no acute distress. Obese LUNGS: Normal breath sounds bilaterally, no wheezing CARDIOVASCULAR: S1, S2 normal. No murmur   ABDOMEN: Soft, nontender, nondistended. Bowel sounds present.  EXTREMITIES: No  edema b/l.    NEUROLOGIC: nonfocal  patient is alert and awake SKIN: No obvious rash, lesion, or ulcer.   LABORATORY PANEL:  CBC Recent Labs  Lab 03/16/22 2152  WBC 7.4  HGB 14.8  HCT 45.3  PLT 180    Chemistries  Recent Labs  Lab 03/16/22 2152  NA 135  K 3.5  CL 100  CO2 23  GLUCOSE 153*  BUN 18  CREATININE 0.88  CALCIUM 8.3*  AST 24  ALT 22  ALKPHOS 56  BILITOT 0.4   Cardiac Enzymes No results for input(s): "TROPONINI" in the last 168 hours. RADIOLOGY:  DG Chest 2 View  Result Date: 03/16/2022 CLINICAL DATA:  Shortness of breath EXAM: CHEST - 2 VIEW COMPARISON:  CT chest dated March 10, 2018 FINDINGS: The heart size and mediastinal contours are within normal limits. Atherosclerotic calcification of the aortic arch. Hyperinflated lungs suggesting COPD without evidence of focal consolidation or pleural effusion. Thoracic spondylosis. No acute osseous abnormality. IMPRESSION: 1. No active cardiopulmonary disease. 2. Hyperinflated lungs suggesting COPD. Electronically Signed   By: Keane Police D.O.   On: 03/16/2022 22:18     Assessment and Plan  Laurie Berg is a 62 y.o. female with medical history significant for CAD , diabetes, current smoker x40+ years COPD not on oxygen who presents to the ED with a several day history of progressively worsening shortness of breath not responding to home inhalers.  She was previously treated for outpatient pneumonia and completed antibiotics and a course of steroids a few days prior.  She denies chest pain, vomiting, abdominal pain or diarrhea.  She was sick to 86% with EMS requiring 4 L to maintain sats in the mid 90s.   Chest x-ray was nonacute showing hyperinflated lungs suggesting COPD.   COVID-19 virus infection Sepsis secondary to COVID-19 infection COPD exacerbation Acute respiratory failure with hypoxia --Sepsis criteria includes fever, tachycardia and tachypnea with respiratory failure requiring O2 at 3 L --WBC normal as well as lactic acid.   --pt started on Paxlovid.  --Discuss with pulmonary Dr.Aleskerov-- recommends PO steroid taper, empiric Zithromax, IV Lasix times one. --Sepsis fluids --Albuterol every 6 and as needed, IV steroids --COVID precautions -- blood cultures negative -- tried to wean to room air. Assessor home oxygen use   Coronary artery disease CAD, three-vessel coronary calcification on chest CT 2023, seen by cardiology in consultation.  Normal echo and Myoview 04/2021  continue aspirin, and Lipitor 20 mg daily    Tobacco use disorder Nicotine patch  Type II diabetes, hypoglycemia, noncompliance to diet -- sugars up in the setting of IV  steroids -- sliding scale insulin resume metformin    Procedures: Family communication : daughters at bedside Consults : pulmonary CODE STATUS: full DVT Prophylaxis : Lovenox Level of care: Telemetry Medical Status is: Inpatient Remains inpatient appropriate because: admitted with COVID infection. Patient also was found to be hypoxic. Will bring to room air. There is a good chance patient may  need oxygen for home.  Anticipate discharge 1 to 2 days    TOTAL TIME TAKING CARE OF THIS PATIENT: 35 minutes.  >50% time spent on counselling and coordination of care  Note: This dictation was prepared with Dragon dictation along with smaller phrase technology. Any transcriptional errors that result from this process are unintentional.  [name] M.D    [facility]   CC: Primary care physician; Marguerita Merles, MD

## 2022-03-17 NOTE — Evaluation (Signed)
Occupational Therapy Evaluation Patient Details Name: Laurie Berg MRN: [id number] DOB: [birth year] Today's Date: 03/17/2022   History of Present Illness 62 y.o. female with medical history significant for CAD (cor Ca2+ on chest CT), diabetes, current smoker x40+ years COPD not on oxygen who presents to the ED with a several day history of progressively worsening shortness of breath not responding to home inhalers.  She was previously treated for outpatient pneumonia and completed antibiotics and a course of steroids a few days prior.  She denies chest pain, vomiting, abdominal pain or diarrhea.  She was sick to 86% with EMS requiring 4 L to maintain sats in the mid 90s.   Clinical Impression   Patient presenting with decreased Ind in self care,balance, functional mobility/transfers, endurance, and safety awareness.  Patient reports living at home with daughter and being Ind in self care and functional mobility. Pt does not utilize O2 at baseline and is on 4Ls during assessment. Pt's O2 drops to 90% with transfer on 4Ls need cues for pursed lip breathing. Pt requesting to remain on [facility] and therapist to leave. Daughters present in room and RN notified.  Patient will benefit from acute OT to increase overall independence in the areas of ADLs, functional mobility, and safety awareness in order to safely discharge home with family.      Recommendations for follow up therapy are one component of a multi-disciplinary discharge planning process, led by the attending physician.  Recommendations may be updated based on patient status, additional functional criteria and insurance authorization.   Follow Up Recommendations  Home health OT     Assistance Recommended at Discharge Intermittent Supervision/Assistance  Patient can return home with the following A little help with walking and/or transfers;A little help with bathing/dressing/bathroom;Help with stairs or ramp for entrance;Assist for  transportation;Assistance with cooking/housework    Functional Status Assessment  Patient has had a recent decline in their functional status and demonstrates the ability to make significant improvements in function in a reasonable and predictable amount of time.  Equipment Recommendations  Other (comment) (RW for energy conservation)       Precautions / Restrictions Precautions Precautions: Fall      Mobility Bed Mobility Overal bed mobility: Modified Independent             General bed mobility comments: no physical assistance provided    Transfers Overall transfer level: Needs assistance Equipment used: 1 person hand held assist Transfers: Sit to/from Stand, Bed to chair/wheelchair/BSC Sit to Stand: Min guard Stand pivot transfers: Min guard                Balance Overall balance assessment: Needs assistance Sitting-balance support: Feet supported Sitting balance-Leahy Scale: Good     Standing balance support: During functional activity, Single extremity supported Standing balance-Leahy Scale: Fair                             ADL either performed or assessed with clinical judgement   ADL Overall ADL's : Needs assistance/impaired                         Toilet Transfer: Min guard;BSC/3in1;Stand-pivot           Functional mobility during ADLs: Min guard       Vision Patient Visual Report: No change from baseline              Pertinent Vitals/Pain Pain  Assessment Pain Assessment: No/denies pain        Extremity/Trunk Assessment Upper Extremity Assessment Upper Extremity Assessment: Generalized weakness   Lower Extremity Assessment Lower Extremity Assessment: Generalized weakness       Communication Communication Communication: No difficulties   Cognition Arousal/Alertness: Awake/alert Behavior During Therapy: WFL for tasks assessed/performed Overall Cognitive Status: Within Functional Limits for tasks  assessed                                                  Home Living Family/patient expects to be discharged to:: Private residence Living Arrangements: Children Available Help at Discharge: Family;Other (Comment) (lives with daughter) Type of Home: Mobile home Home Access: Stairs to enter Entrance Stairs-Number of Steps: 7 Entrance Stairs-Rails: Right;Left Home Layout: One level     Bathroom Shower/Tub: Teacher, early years/pre: Standard     Home Equipment: None          Prior Functioning/Environment Prior Level of Function : Independent/Modified Independent               ADLs Comments: per chart review, pt reports being Ind at baseline with self care tasks and mobility. Daughter and pt share IADL tasks.        OT Problem List: Cardiopulmonary status limiting activity;Decreased strength;Decreased activity tolerance;Decreased safety awareness;Impaired balance (sitting and/or standing)      OT Treatment/Interventions: Self-care/ADL training;Therapeutic exercise;Therapeutic activities;Energy conservation;DME and/or AE instruction;Patient/family education;Balance training    OT Goals(Current goals can be found in the care plan section) Acute Rehab OT Goals Patient Stated Goal: to go home and feel better OT Goal Formulation: With patient/family Time For Goal Achievement: 03/31/22 Potential to Achieve Goals: Good ADL Goals Pt Will Perform Grooming: with modified independence;standing Pt Will Perform Lower Body Dressing: with supervision;sit to/from stand Pt Will Transfer to Toilet: with supervision;ambulating Pt Will Perform Toileting - Clothing Manipulation and hygiene: with supervision;sit to/from stand  OT Frequency: Min 2X/week       AM-PAC OT "6 Clicks" Daily Activity     Outcome Measure Help from another person eating meals?: None Help from another person taking care of personal grooming?: None Help from another person  toileting, which includes using toliet, bedpan, or urinal?: A Little Help from another person bathing (including washing, rinsing, drying)?: A Little Help from another person to put on and taking off regular upper body clothing?: None Help from another person to put on and taking off regular lower body clothing?: A Little 6 Click Score: 21   End of Session Nurse Communication: Mobility status;Other (comment) (pt on [facility])  Activity Tolerance: Patient tolerated treatment well Patient left: Other (comment) (on BSC with daughters in room)  OT Visit Diagnosis: Muscle weakness (generalized) (M62.81)                Time: 2248-2500 OT Time Calculation (min): 16 min Charges:  OT General Charges $OT Visit: 1 Visit OT Evaluation $OT Eval Low Complexity: 1 Low OT Treatments $Self Care/Home Management : 8-22 mins  Darleen Crocker, MS, OTR/L , CBIS ascom [phone]  03/17/22, 1:30 PM

## 2022-03-17 NOTE — Inpatient Diabetes Management (Signed)
Inpatient Diabetes Program Recommendations  AACE/ADA: New Consensus Statement on Inpatient Glycemic Control   Target Ranges:  Prepandial:   less than 140 mg/dL      Peak postprandial:   less than 180 mg/dL (1-2 hours)      Critically ill patients:  140 - 180 mg/dL    Latest Reference Range & Units 03/17/22 10:03  Glucose-Capillary 70 - 99 mg/dL 302 (H)    Latest Reference Range & Units 03/16/22 21:52  Glucose 70 - 99 mg/dL 153 (H)   Review of Glycemic Control  Diabetes history: DM2 Outpatient Diabetes medications: Glumetza 500 mg daily Current orders for Inpatient glycemic control: None; Solumedrol 40 mg Q12H  Inpatient Diabetes Program Recommendations:    Insulin: Please consider ordering CBGs AC&HS with Novolog 0-15 units TID with meals and Novolog 0-5 units QHS.  Thanks, Barnie Alderman, RN, MSN, [place] Diabetes Coordinator Inpatient Diabetes Program [phone] (Team Pager from 8am to [place])

## 2022-03-17 NOTE — Discharge Instructions (Addendum)
Carbohydrate Counting For People With Diabetes  Foods with carbohydrates make your blood glucose level go up. Learning how to count carbohydrates can help you control your blood glucose levels. First, identify the foods you eat that contain carbohydrates. Then, using the Foods with Carbohydrates chart, determine about how much carbohydrates are in your meals and snacks. Make sure you are eating foods with fiber, protein, and healthy fat along with your carbohydrate foods. Foods with Carbohydrates The following table shows carbohydrate foods that have about 15 grams of carbohydrate each. Using measuring cups, spoons, or a food scale when you first begin learning about carbohydrate counting can help you learn about the portion sizes you typically eat. The following foods have 15 grams carbohydrate each:  Grains 1 slice bread (1 ounce)  1 small tortilla (6-inch size)   large bagel (1 ounce)  1/3 cup pasta or rice (cooked)   hamburger or hot dog bun ( ounce)   cup cooked cereal   to  cup ready-to-eat cereal  2 taco shells (5-inch size) Fruit 1 small fresh fruit ( to 1 cup)   medium banana  17 small grapes (3 ounces)  1 cup melon or berries   cup canned or frozen fruit  2 tablespoons dried fruit (blueberries, cherries, cranberries, raisins)   cup unsweetened fruit juice  Starchy Vegetables  cup cooked beans, peas, corn, potatoes/sweet potatoes   large baked potato (3 ounces)  1 cup acorn or butternut squash  Snack Foods 3 to 6 crackers  8 potato chips or 13 tortilla chips ( ounce to 1 ounce)  3 cups popped popcorn  Dairy 3/4 cup (6 ounces) nonfat plain yogurt, or yogurt with sugar-free sweetener  1 cup milk  1 cup plain rice, soy, coconut or flavored almond milk Sweets and Desserts  cup ice cream or frozen yogurt  1 tablespoon jam, jelly, pancake syrup, table sugar, or honey  2 tablespoons light pancake syrup  1 inch square of frosted cake or 2 inch square of unfrosted  cake  2 small cookies (2/3 ounce each) or  large cookie  Sometimes you'll have to estimate carbohydrate amounts if you don't know the exact recipe. One cup of mixed foods like soups can have 1 to 2 carbohydrate servings, while some casseroles might have 2 or more servings of carbohydrate. Foods that have less than 20 calories in each serving can be counted as "free" foods. Count 1 cup raw vegetables, or  cup cooked non-starchy vegetables as "free" foods. If you eat 3 or more servings at one meal, then count them as 1 carbohydrate serving.  Foods without Carbohydrates  Not all foods contain carbohydrates. Meat, some dairy, fats, non-starchy vegetables, and many beverages don't contain carbohydrate. So when you count carbohydrates, you can generally exclude chicken, pork, beef, fish, seafood, eggs, tofu, cheese, butter, sour cream, avocado, nuts, seeds, olives, mayonnaise, water, black coffee, unsweetened tea, and zero-calorie drinks. Vegetables with no or low carbohydrate include green beans, cauliflower, tomatoes, and onions. How much carbohydrate should I eat at each meal?  Carbohydrate counting can help you plan your meals and manage your weight. Following are some starting points for carbohydrate intake at each meal. Work with your registered dietitian nutritionist to find the best range that works for your blood glucose and weight.   To Lose Weight To Maintain Weight  Women 2 - 3 carb servings 3 - 4 carb servings  Men 3 - 4 carb servings 4 - 5 carb servings  Checking your   blood glucose after meals will help you know if you need to adjust the timing, type, or number of carbohydrate servings in your meal plan. Achieve and keep a healthy body weight by balancing your food intake and physical activity.  Tips How should I plan my meals?  Plan for half the food on your plate to include non-starchy vegetables, like salad greens, broccoli, or carrots. Try to eat 3 to 5 servings of non-starchy vegetables  every day. Have a protein food at each meal. Protein foods include chicken, fish, meat, eggs, or beans (note that beans contain carbohydrate). These two food groups (non-starchy vegetables and proteins) are low in carbohydrate. If you fill up your plate with these foods, you will eat less carbohydrate but still fill up your stomach. Try to limit your carbohydrate portion to  of the plate.  What fats are healthiest to eat?  Diabetes increases risk for heart disease. To help protect your heart, eat more healthy fats, such as olive oil, nuts, and avocado. Eat less saturated fats like butter, cream, and high-fat meats, like bacon and sausage. Avoid trans fats, which are in all foods that list "partially hydrogenated oil" as an ingredient. What should I drink?  Choose drinks that are not sweetened with sugar. The healthiest choices are water, carbonated or seltzer waters, and tea and coffee without added sugars.  Sweet drinks will make your blood glucose go up very quickly. One serving of soda or energy drink is  cup. It is best to drink these beverages only if your blood glucose is low.  Artificially sweetened, or diet drinks, typically do not increase your blood glucose if they have zero calories in them. Read labels of beverages, as some diet drinks do have carbohydrate and will raise your blood glucose. Label Reading Tips Read Nutrition Facts labels to find out how many grams of carbohydrate are in a food you want to eat. Don't forget: sometimes serving sizes on the label aren't the same as how much food you are going to eat, so you may need to calculate how much carbohydrate is in the food you are serving yourself.   Carbohydrate Counting for People with Diabetes Sample 1-Day Menu  Breakfast  cup yogurt, low fat, low sugar (1 carbohydrate serving)   cup cereal, ready-to-eat, unsweetened (1 carbohydrate serving)  1 cup strawberries (1 carbohydrate serving)   cup almonds ( carbohydrate serving)   Lunch 1, 5 ounce can chunk light tuna  2 ounces cheese, low fat cheddar  6 whole wheat crackers (1 carbohydrate serving)  1 small apple (1 carbohydrate servings)   cup carrots ( carbohydrate serving)   cup snap peas  1 cup 1% milk (1 carbohydrate serving)   Evening Meal Stir fry made with: 3 ounces chicken  1 cup brown rice (3 carbohydrate servings)   cup broccoli ( carbohydrate serving)   cup green beans   cup onions  1 tablespoon olive oil  2 tablespoons teriyaki sauce ( carbohydrate serving)  Evening Snack 1 extra small banana (1 carbohydrate serving)  1 tablespoon peanut butter   Carbohydrate Counting for People with Diabetes Vegan Sample 1-Day Menu  Breakfast 1 cup cooked oatmeal (2 carbohydrate servings)   cup blueberries (1 carbohydrate serving)  2 tablespoons flaxseeds  1 cup soymilk fortified with calcium and vitamin D  1 cup coffee  Lunch 2 slices whole wheat bread (2 carbohydrate servings)   cup baked tofu   cup lettuce  2 slices tomato  2 slices avocado     cup baby carrots ( carbohydrate serving)  1 orange (1 carbohydrate serving)  1 cup soymilk fortified with calcium and vitamin D   Evening Meal Burrito made with: 1 6-inch corn tortilla (1 carbohydrate serving)  1 cup refried vegetarian beans (2 carbohydrate servings)   cup chopped tomatoes   cup lettuce   cup salsa  1/3 cup brown rice (1 carbohydrate serving)  1 tablespoon olive oil for rice   cup zucchini   Evening Snack 6 small whole grain crackers (1 carbohydrate serving)  2 apricots ( carbohydrate serving)   cup unsalted peanuts ( carbohydrate serving)    Carbohydrate Counting for People with Diabetes Vegetarian (Lacto-Ovo) Sample 1-Day Menu  Breakfast 1 cup cooked oatmeal (2 carbohydrate servings)   cup blueberries (1 carbohydrate serving)  2 tablespoons flaxseeds  1 egg  1 cup 1% milk (1 carbohydrate serving)  1 cup coffee  Lunch 2 slices whole wheat bread (2 carbohydrate  servings)  2 ounces low-fat cheese   cup lettuce  2 slices tomato  2 slices avocado   cup baby carrots ( carbohydrate serving)  1 orange (1 carbohydrate serving)  1 cup unsweetened tea  Evening Meal Burrito made with: 1 6-inch corn tortilla (1 carbohydrate serving)   cup refried vegetarian beans (1 carbohydrate serving)   cup tomatoes   cup lettuce   cup salsa  1/3 cup brown rice (1 carbohydrate serving)  1 tablespoon olive oil for rice   cup zucchini  1 cup 1% milk (1 carbohydrate serving)  Evening Snack 6 small whole grain crackers (1 carbohydrate serving)  2 apricots ( carbohydrate serving)   cup unsalted peanuts ( carbohydrate serving)    Copyright 2020  Academy of Nutrition and Dietetics. All rights reserved.  Using Nutrition Labels: Carbohydrate  Serving Size  Look at the serving size. All the information on the label is based on this portion. Servings Per Container  The number of servings contained in the package. Guidelines for Carbohydrate  Look at the total grams of carbohydrate in the serving size.  1 carbohydrate choice = 15 grams of carbohydrate. Range of Carbohydrate Grams Per Choice  Carbohydrate Grams/Choice Carbohydrate Choices  6-10   11-20 1  21-25 1  26-35 2  36-40 2  41-50 3  51-55 3  56-65 4  66-70 4  71-80 5    Copyright 2020  Academy of Nutrition and Dietetics. All rights reserved.   Plate Method for Diabetes   Foods with carbohydrates make your blood glucose level go up. The plate method is a simple way to meal plan and control the amount of carbohydrate you eat.         Use the following guidance to build a healthy plate to control carbohydrates. Divide a 9-inch plate into 3 sections, and consider your beverage the 4th section of your meal: Food Group Examples of Foods/Beverages for This Section of your Meal  Section 1: Non-starchy vegetables Fill  of your plate to include non-starchy vegetables Asparagus, broccoli,  brussels sprouts, cabbage, carrots, cauliflower, celery, cucumber, green beans, mushrooms, peppers, salad greens, tomatoes, or zucchini.  Section 2: Protein foods Fill  of your plate to include a lean protein Lean meat, poultry, fish, seafood, cheese, eggs, lean deli meat, tofu, beans, lentils, nuts or nut butters.  Section 3: Carbohydrate foods Fill  of your plate to include carbohydrate foods Whole grains, whole wheat bread, brown rice, whole grain pasta, polenta, corn tortillas, fruit, or starchy vegetables (potatoes, green peas, corn, beans,  acorn squash, and butternut squash). One cup of milk also counts as a food that contains carbohydrate.  Section 4: Beverage Choose water or a low-calorie drink for your beverage. Unsweetened tea, coffee, or flavored/sparkling water without added sugar.  Image reprinted with permission from The American Diabetes Association.  Copyright 2022 by the American Diabetes Association.   Copyright 2022  Academy of Nutrition and Dietetics. All rights reserved

## 2022-03-18 ENCOUNTER — Encounter: Payer: Self-pay | Admitting: Internal Medicine

## 2022-03-18 ENCOUNTER — Inpatient Hospital Stay (HOSPITAL_COMMUNITY)
Admit: 2022-03-18 | Discharge: 2022-03-18 | Disposition: A | Discharge status: Home / Self Care | Payer: Medicare HMO | Attending: Internal Medicine | Admitting: Internal Medicine

## 2022-03-18 ENCOUNTER — Inpatient Hospital Stay: Payer: Medicare HMO

## 2022-03-18 DIAGNOSIS — R079 Chest pain, unspecified: Secondary | ICD-10-CM | POA: Diagnosis not present

## 2022-03-18 DIAGNOSIS — J441 Chronic obstructive pulmonary disease with (acute) exacerbation: Secondary | ICD-10-CM | POA: Diagnosis not present

## 2022-03-18 DIAGNOSIS — U071 COVID-19: Secondary | ICD-10-CM | POA: Diagnosis not present

## 2022-03-18 DIAGNOSIS — R072 Precordial pain: Secondary | ICD-10-CM | POA: Diagnosis not present

## 2022-03-18 LAB — ECHOCARDIOGRAM COMPLETE
AR max vel: 1.8 cm2
AV Peak grad: 8.5 mmHg
Ao pk vel: 1.46 m/s
Area-P 1/2: 4.74 cm2
Height: 64 in
S' Lateral: 3.2 cm
Weight: 2807.78 oz

## 2022-03-18 LAB — PROCALCITONIN: Procalcitonin: 0.12 ng/mL

## 2022-03-18 LAB — TROPONIN I (HIGH SENSITIVITY)
Troponin I (High Sensitivity): 6 ng/L (ref <18)
Troponin I (High Sensitivity): 5 ng/L (ref <18)

## 2022-03-18 LAB — GLUCOSE, CAPILLARY
Glucose-Capillary: 220 mg/dL — ABNORMAL HIGH (ref 70–99)
Glucose-Capillary: 269 mg/dL — ABNORMAL HIGH (ref 70–99)
Glucose-Capillary: 251 mg/dL — ABNORMAL HIGH (ref 70–99)
Glucose-Capillary: 352 mg/dL — ABNORMAL HIGH (ref 70–99)

## 2022-03-18 LAB — BRAIN NATRIURETIC PEPTIDE: B Natriuretic Peptide: 87.5 pg/mL (ref 0.0–100.0)

## 2022-03-18 LAB — C-REACTIVE PROTEIN: CRP: 10.1 mg/dL — ABNORMAL HIGH (ref <1.0)

## 2022-03-18 MED ORDER — ASPIRIN 81 MG PO CHEW
324.0000 mg | CHEWABLE_TABLET | Freq: Once | ORAL | Status: AC
  Administered 2022-03-18: 324 mg via ORAL
  Filled 2022-03-18: qty 4

## 2022-03-18 MED ORDER — ASPIRIN 81 MG PO CHEW
81.0000 mg | CHEWABLE_TABLET | Freq: Every day | ORAL | Status: DC
  Administered 2022-03-19 – 2022-03-21 (×3): 81 mg via ORAL
  Filled 2022-03-18 (×3): qty 1

## 2022-03-18 MED ORDER — NITROGLYCERIN 0.4 MG SL SUBL
0.4000 mg | SUBLINGUAL_TABLET | SUBLINGUAL | Status: DC | PRN
  Administered 2022-03-18 (×2): 0.4 mg via SUBLINGUAL
  Filled 2022-03-18: qty 1

## 2022-03-18 NOTE — Progress Notes (Signed)
PULMONOLOGY         Date: 03/18/2022,   MRN# [id number] Laurie Berg 1960-05-10     AdmissionWeight: 79.6 kg                 CurrentWeight: 79.6 kg  Referring provider: Dr. Lydia Guiles   CHIEF COMPLAINT:   Acute on chronic hypoxemic respiratory failure   HISTORY OF PRESENT ILLNESS   This is a very pleasant 62 year old female known to me from outpatient pulmonology clinic who was lifelong smoker with history of COPD and chronic hypoxemia.  He also has a background history of chronic pain syndrome, occult positive stools, colonic polyps, cervical radiculopathy history of sepsis and COVID-19.  Patient still currently smokes with minimal improvement post inhaler therapy outpatient.  Recent treatment with antimicrobials and steroids for acute exacerbation of COPD.  Patient came in with hypoxemia requiring 4 L supplemental O2 to reach 90% SpO2.  Also noted to have febrile flulike illness.  PCR for COVID came back positive.  PCCM consultation for additional evaluation management.  Reviewed previous CT chest from March 10, 2022 with findings of centrilobular emphysema mild congestion bilaterally and bronchitic COPD phenotype.   03/18/22- patient is improved today.  She did desaturate on ambulation despite having 3L/min [place]. She is less ronchorous today.  CRP is moderately elevated today.    PAST MEDICAL HISTORY   Past Medical History:  Diagnosis Date   Cervical radiculopathy    Chronic pain syndrome    Colon polyps    COPD (chronic obstructive pulmonary disease) (HCC)    Coronary artery calcification seen on CT scan    a. 04/2021 High Res CT Chest: Ao atherosclerosis, cor Ca2+; b. 07/2021 MV: EF 69%, no isch/infarct, LAD/LCX Ca2+ noted; c. 02/2022 CT Chest: Cor and Ao atherosclerosis.   COVID-19 virus infection    Diabetes mellitus without complication ([place])    History of echocardiogram    a. 08/2021 Echo: EF 55-60%, no rwma, nl RV fxn.   Lung nodule    a. 02/2022 CT Chest:  RLL 41m solid subpleural pulm nodule, prev 478m->rec thoracic surgery eval.   [place]   Tobacco abuse      SURGICAL HISTORY   Past Surgical History:  Procedure Laterality Date   CARPAL TUNNEL RELEASE     COLONOSCOPY WITH PROPOFOL N/A 10/21/2019   Procedure: COLONOSCOPY WITH PROPOFOL;  Surgeon: TaVirgel ManifoldMD;  Location: [facility] ENDOSCOPY;  Service: Endoscopy;  Laterality: N/A;   DILATION AND CURETTAGE OF UTERUS     TUBAL LIGATION       FAMILY HISTORY   History reviewed. No pertinent family history.   SOCIAL HISTORY   Social History   Tobacco Use   Smoking status: Every Day    Packs/day: 1.75    Years: 48.00    Total pack years: 84.00    Types: Cigarettes   Smokeless tobacco: Never  Vaping Use   Vaping Use: Never used  Substance Use Topics   Alcohol use: Not Currently   Drug use: Never     MEDICATIONS    Home Medication:     Current Medication:  Current Facility-Administered Medications:    acetaminophen (TYLENOL) tablet 650 mg, 650 mg, Oral, Q6H PRN **OR** acetaminophen (TYLENOL) suppository 650 mg, 650 mg, Rectal, Q6H PRN, DuAthena MasseMD   albuterol (VENTOLIN HFA) 108 (90 Base) MCG/ACT inhaler 2 puff, 2 puff, Inhalation, Q6H, DuAthena MasseMD, 2 puff at 03/18/22 [phone]  ascorbic acid (VITAMIN C) tablet 500 mg, 500 mg, Oral, Daily, Lanney Gins, Tinnie Kunin, MD, 500 mg at 03/18/22 0930   azithromycin (ZITHROMAX) tablet 250 mg, 250 mg, Oral, Daily, Ottie Glazier, MD, 250 mg at 03/18/22 8588   chlorpheniramine-HYDROcodone (TUSSIONEX) 10-8 MG/5ML suspension 5 mL, 5 mL, Oral, Q12H PRN, Athena Masse, MD   cyclobenzaprine (FLEXERIL) tablet 10 mg, 10 mg, Oral, BID PRN, [name], MD, 10 mg at 03/17/22 2153   DULoxetine (CYMBALTA) DR capsule 90 mg, 90 mg, Oral, Daily, [name], MD, 90 mg at 03/18/22 0929   enoxaparin (LOVENOX) injection 40 mg, 40 mg, Subcutaneous, QHS, Athena Masse, MD, 40 mg at 03/17/22 2153   feeding supplement (GLUCERNA  SHAKE) (GLUCERNA SHAKE) liquid 237 mL, 237 mL, Oral, TID BM, [name], MD, 237 mL at 03/18/22 0936   guaiFENesin (MUCINEX) 12 hr tablet 600 mg, 600 mg, Oral, BID, Athena Masse, MD, 600 mg at 03/17/22 2315   guaiFENesin-dextromethorphan (ROBITUSSIN DM) 100-10 MG/5ML syrup 10 mL, 10 mL, Oral, Q4H PRN, Athena Masse, MD   insulin aspart (novoLOG) injection 0-15 Units, 0-15 Units, Subcutaneous, TID WC, [name], MD, 5 Units at 03/18/22 0931   insulin aspart (novoLOG) injection 0-5 Units, 0-5 Units, Subcutaneous, QHS, [name], MD, 5 Units at 03/17/22 2153   ipratropium-albuterol (DUONEB) 0.5-2.5 (3) MG/3ML nebulizer solution 3 mL, 3 mL, Nebulization, Q4H PRN, [name], MD   metFORMIN (GLUCOPHAGE-XR) 24 hr tablet 500 mg, 500 mg, Oral, Q breakfast, [name], MD, 500 mg at 03/18/22 0930   multivitamin with minerals tablet 1 tablet, 1 tablet, Oral, Daily, [name], MD, 1 tablet at 03/18/22 0930   nicotine (NICODERM CQ - dosed in mg/24 hours) patch 21 mg, 21 mg, Transdermal, Daily, [name], MD, 21 mg at 03/18/22 0930   nirmatrelvir/ritonavir (PAXLOVID) 3 tablet, 3 tablet, Oral, BID, [name], MD, 3 tablet at 03/18/22 0932   nitroGLYCERIN (NITROSTAT) SL tablet 0.4 mg, 0.4 mg, Sublingual, Q5 min PRN, [name], MD, 0.4 mg at 03/18/22 0451   ondansetron (ZOFRAN) tablet 4 mg, 4 mg, Oral, Q6H PRN **OR** ondansetron (ZOFRAN) injection 4 mg, 4 mg, Intravenous, Q6H PRN, Athena Masse, MD   oxyCODONE (Oxy IR/ROXICODONE) immediate release tablet 5 mg, 5 mg, Oral, Q4H PRN, Athena Masse, MD, 5 mg at 03/17/22 2153   predniSONE (DELTASONE) tablet 50 mg, 50 mg, Oral, Q breakfast, Ottie Glazier, MD, 50 mg at 03/18/22 0930   zinc sulfate capsule 220 mg, 220 mg, Oral, Daily, Ottie Glazier, MD, 220 mg at 03/18/22 0930    ALLERGIES   Pregabalin and Sulfamethoxazole-trimethoprim     REVIEW OF SYSTEMS    Review of Systems:  Gen:  Denies  fever, sweats, chills weigh  loss  HEENT: Denies blurred vision, double vision, ear pain, eye pain, hearing loss, nose bleeds, sore throat Cardiac:  No dizziness, chest pain or heaviness, chest tightness,edema Resp:   reports dyspnea chronically  Gi: Denies swallowing difficulty, stomach pain, nausea or vomiting, diarrhea, constipation, bowel incontinence Gu:  Denies bladder incontinence, burning urine Ext:   Denies Joint pain, stiffness or swelling Skin: Denies  skin rash, easy bruising or bleeding or hives Endoc:  Denies polyuria, polydipsia , polyphagia or weight change Psych:   Denies depression, insomnia or hallucinations   Other:  All other systems negative   VS: BP 122/77 (BP Location: Left Arm)   Pulse 73   Temp 97.8 F (36.6 C) (Oral)   Resp 16  Ht '5\' 4"'$  (1.626 m)   Wt 79.6 kg   SpO2 94%   BMI 30.12 kg/m      PHYSICAL EXAM    GENERAL:NAD, no fevers, chills, no weakness no fatigue HEAD: Normocephalic, atraumatic.  EYES: Pupils equal, round, reactive to light. Extraocular muscles intact. No scleral icterus.  MOUTH: Moist mucosal membrane. Dentition intact. No abscess noted.  EAR, NOSE, THROAT: Clear without exudates. No external lesions.  NECK: Supple. No thyromegaly. No nodules. No JVD.  PULMONARY: decreased breath sounds with mild rhonchi worse at bases bilaterally.  CARDIOVASCULAR: S1 and S2. Regular rate and rhythm. No murmurs, rubs, or gallops. No edema. Pedal pulses 2+ bilaterally.  GASTROINTESTINAL: Soft, nontender, nondistended. No masses. Positive bowel sounds. No hepatosplenomegaly.  MUSCULOSKELETAL: No swelling, clubbing, or edema. Range of motion full in all extremities.  NEUROLOGIC: Cranial nerves II through XII are intact. No gross focal neurological deficits. Sensation intact. Reflexes intact.  SKIN: No ulceration, lesions, rashes, or cyanosis. Skin warm and dry. Turgor intact.  PSYCHIATRIC: Mood, affect within normal limits. The patient is awake, alert and oriented x 3. Insight,  judgment intact.       IMAGING        ASSESSMENT/PLAN      Acute COVID19 viral LRTI with COPD exacerbation -Remdesevir antiviral - has been dcd -vitamin C -zinc -switching solumedrol to prednisone 50 mg with tapering by '5mg'$  daily   -Diuresis - Lasix 20 IV daily - monitor UOP - utilize external urinary catheter if possible -encourage to use IS and Acapella device for bronchopulmonary hygiene when able -consider  -CRP -supportive care with ICU telemetry monitoring -PT/OT when possible -procalcitonin, CRP and ferritin trending -agree with nebulizer therapy routine q6h     Thank you for allowing me to participate in the care of this patient.   Patient/Family are satisfied with care plan and all questions have been answered.    Provider disclosure: Patient with at least one acute or chronic illness or injury that poses a threat to life or bodily function and is being managed actively during this encounter.  All of the below services have been performed independently by signing provider:  review of prior documentation from internal and or external health records.  Review of previous and current lab results.  Interview and comprehensive assessment during patient visit today. Review of current and previous chest radiographs/CT scans. Discussion of management and test interpretation with health care team and patient/family.   This document was prepared using Dragon voice recognition software and may include unintentional dictation errors.     Ottie Glazier, M.D.  Division of Pulmonary & Critical Care Medicine

## 2022-03-18 NOTE — TOC Initial Note (Addendum)
Transition of Care [facility]) - Initial/Assessment Note    Patient Details  Name: Laurie Berg MRN: [id number] Date of Birth: [birth year]  Transition of Care [facility]) CM/SW Contact:    Magnus Ivan, LCSW Phone Number: 03/18/2022, 4:04 PM  Clinical Narrative:                 Spoke to patient regarding DC planning. Patient lives with her daughter and grandson. Patient drives. PCP is Dr. Lennox Grumbles. [facility]. No DME or HH history. Per MD and RN, patient does not qualify for home oxygen.  OT rec HH yesterday, PT had no rec today. Patient declines [place] at this time, says she does not feel she needs it. Agreed to let staff know if she changes her mind prior to DC.  Patient denies additional TOC needs at this time.    Expected Discharge Plan: [place] Barriers to Discharge: Continued Medical Work up   Patient Goals and CMS Choice Patient states their goals for this hospitalization and ongoing recovery are:: home with daughter and grandson CMS Medicare.gov Compare Post Acute Care list provided to:: Patient Choice offered to / list presented to : Patient      Expected Discharge Plan and Services       Living arrangements for the past 2 months: Single Family Home                                      Prior Living Arrangements/Services Living arrangements for the past 2 months: Single Family Home Lives with:: Adult Children, Relatives Patient language and need for interpreter reviewed:: Yes Do you feel safe going back to the place where you live?: Yes      Need for Family Participation in Patient Care: Yes (Comment) Care giver support system in place?: Yes (comment)   Criminal Activity/Legal Involvement Pertinent to Current Situation/Hospitalization: No - Comment as needed  Activities of Berg Living Home Assistive Devices/Equipment: Eyeglasses ADL Screening (condition at time of admission) Patient's cognitive ability adequate to safely  complete Berg activities?: Yes Is the patient deaf or have difficulty hearing?: No Does the patient have difficulty seeing, even when wearing glasses/contacts?: No Does the patient have difficulty concentrating, remembering, or making decisions?: No Patient able to express need for assistance with ADLs?: Yes Does the patient have difficulty dressing or bathing?: No Independently performs ADLs?: Yes (appropriate for developmental age) Does the patient have difficulty walking or climbing stairs?: No Weakness of Legs: None Weakness of Arms/Hands: None  Permission Sought/Granted                  Emotional Assessment       Orientation: : Oriented to Self, Oriented to Situation, Oriented to Place, Oriented to  Time Alcohol / Substance Use: Not Applicable Psych Involvement: No (comment)  Admission diagnosis:  Acute respiratory failure with hypoxia ([place]) [J96.01] COPD with acute bronchitis ([place]) [J44.0, J20.9] COPD with acute exacerbation ([place]) [J44.1] Diabetes mellitus due to underlying condition with hyperosmolarity without coma, without long-term current use of insulin ([place]) [E08.00] Patient Active Problem List   Diagnosis Date Noted   Chest pain 03/18/2022   COVID-19 virus infection 03/17/2022   COPD with acute bronchitis ([place]) 03/16/2022   Acute respiratory failure with hypoxia ([place]) 03/16/2022   Sepsis ([place]) 03/16/2022   COPD with acute exacerbation ([place]) 03/16/2022   Tobacco use disorder 03/16/2022  Coronary artery disease 03/16/2022   Occult blood positive stool    Polyp of colon    Pain medication agreement signed 12/21/2016   Chronic pain 12/04/2012   Long term current use of opiate analgesic 12/04/2012   Low back pain 10/04/2010   Neck pain 10/04/2010   PCP:  Marguerita Merles, MD [id number] [place] [id number]: [phone]: [phone]    Social Determinants of Health  (SDOH) Social History: SDOH Screenings   Tobacco Use: High Risk (03/18/2022)   SDOH Interventions:     Readmission Risk Interventions     No data to display

## 2022-03-18 NOTE — Consult Note (Signed)
Cardiology Consult    Patient ID: Laurie Berg MRN: [id number], DOB/AGE: 1961/02/03   Admit date: 03/16/2022 Date of Consult: 03/18/2022  Primary Physician: Marguerita Merles, MD Primary Cardiologist: Kate Sable, MD Requesting Provider: Chauncey Cruel. Patel  Patient Profile    Laurie Berg is a 62 y.o. female with a history of tob abuse, COPD, DMII, coronary and Ao Ca2+/atherosclerosis, RLL nodule, and chronic pain, who is being seen today for the evaluation of precordial chest pain at the request of Dr. Posey Pronto.  Past Medical History   Past Medical History:  Diagnosis Date   Cervical radiculopathy    Chronic pain syndrome    Colon polyps    COPD (chronic obstructive pulmonary disease) (HCC)    Coronary artery calcification seen on CT scan    a. 04/2021 High Res CT Chest: Ao atherosclerosis, cor Ca2+; b. 07/2021 MV: EF 69%, no isch/infarct, LAD/LCX Ca2+ noted; c. 02/2022 CT Chest: Cor and Ao atherosclerosis.   COVID-19 virus infection    Diabetes mellitus without complication ([place])    History of echocardiogram    a. 08/2021 Echo: EF 55-60%, no rwma, nl RV fxn.   Lung nodule    a. 02/2022 CT Chest: RLL 365m solid subpleural pulm nodule, prev 428m->rec thoracic surgery eval.   [place]epsis ([place]   Tobacco abuse     Past Surgical History:  Procedure Laterality Date   CARPAL TUNNEL RELEASE     COLONOSCOPY WITH PROPOFOL N/A 10/21/2019   Procedure: COLONOSCOPY WITH PROPOFOL;  Surgeon: TaVirgel ManifoldMD;  Location: [facility] ENDOSCOPY;  Service: Endoscopy;  Laterality: N/A;   DILATION AND CURETTAGE OF UTERUS     TUBAL LIGATION       Allergies  Allergies  Allergen Reactions   Pregabalin Shortness Of Breath and Swelling    "Felt like I couldn't breathe and my tongue was swelling up"   Sulfamethoxazole-Trimethoprim Itching    History of Present Illness     [age].o. female with a history of tob abuse, COPD, DMII, coronary and Ao Ca2+/atherosclerosis, RLL nodule, and chronic pain.  In  the setting of ongoing tobacco abuse, she underwent screening CT chest in 04/2021, which showed 3 vessel coronary Ca2+ and Ao atherosclerosis.  She saw Dr. AgGaren Lahn clinic and reported DOE and chest pain.  Myoview in 07/2021 was low risk w/o isch/infarct, though LAD and LCX Ca2+ was noted.  Echo in 08/2021 showed nl LV and RV fxn.  She had stable DOE when last seen in cardiology clinic in 09/2021.  More recently, Laurie Berg had increasing DOE.  She continues to smoke.  She is followed by pulmonology and was recently treated w/ Abx and steroids for AECOPD.  Recent CT chest showed increased size of RLL nodule, up to 65m765m Unfortunately, due to worsening dyspnea, she presented to the ED on 2/1 w/ hypoxemia (90% on 4lpm).  She also reported flu-like symptoms and fever.  COVID PCR was +.  Initial CXR w/o acute findings.  She was admitted and placed on antiviral and steroid Rx.  Seen by pulm 2/2 and remdesivir transitioned to Paxlovid, steroids changed to PO.  She has also been on azithromycin and nebs.  F/u CXR this AM w/ L apical PNA.  Overnight last night, she felt that her nasal cannula wasn't delivering O2 effectively and she developed substernal chest heaviness and dyspnea.  ECG showed sinus rhythm @ 83 w/o acute ST/T changes.  Heaviness persisted all night long and only started  to improve when nasal cannula tubing was changed.  Chest pain was worse with deep breathing, lying flat, and palpation ("chest sore").  HsTroponins have been nl throughout admission.  She is currently chest pain free.  Inpatient Medications     albuterol  2 puff Inhalation Q6H   ascorbic acid  500 mg Oral Daily   azithromycin  250 mg Oral Daily   DULoxetine  90 mg Oral Daily   enoxaparin (LOVENOX) injection  40 mg Subcutaneous QHS   feeding supplement (GLUCERNA SHAKE)  237 mL Oral TID BM   guaiFENesin  600 mg Oral BID   insulin aspart  0-15 Units Subcutaneous TID WC   insulin aspart  0-5 Units Subcutaneous QHS    metFORMIN  500 mg Oral Q breakfast   multivitamin with minerals  1 tablet Oral Daily   nicotine  21 mg Transdermal Daily   nirmatrelvir/ritonavir  3 tablet Oral BID   predniSONE  50 mg Oral Q breakfast   zinc sulfate  220 mg Oral Daily    Family History    History reviewed. No pertinent family history. She indicated that her mother is deceased. She indicated that her father is deceased. She indicated that her sister is alive.   Social History    Social History   Socioeconomic History   Marital status: Divorced    Spouse name: Not on file   Number of children: Not on file   Years of education: Not on file   Highest education level: Not on file  Occupational History   Not on file  Tobacco Use   Smoking status: Every Day    Packs/day: 1.75    Years: 48.00    Total pack years: 84.00    Types: Cigarettes   Smokeless tobacco: Never  Vaping Use   Vaping Use: Never used  Substance and Sexual Activity   Alcohol use: Not Currently   Drug use: Never   Sexual activity: Not on file  Other Topics Concern   Not on file  Social History Narrative   Not on file   Social Determinants of Health   Financial Resource Strain: Not on file  Food Insecurity: Not on file  Transportation Needs: Not on file  Physical Activity: Not on file  Stress: Not on file  Social Connections: Not on file  Intimate Partner Violence: Not on file     Review of Systems    General:  No chills, fever, night sweats or weight changes.  Cardiovascular:  +++ chest pain, +++ dyspnea on exertion, no edema, orthopnea, palpitations, paroxysmal nocturnal dyspnea. Dermatological: No rash, lesions/masses Respiratory: +++ cough, +++ dyspnea Urologic: No hematuria, dysuria Abdominal:   No nausea, vomiting, diarrhea, bright red blood per rectum, melena, or hematemesis Neurologic:  No visual changes, wkns, changes in mental status. All other systems reviewed and are otherwise negative except as noted  above.  Physical Exam    Blood pressure 122/77, pulse 73, temperature 97.8 F (36.6 C), temperature source Oral, resp. rate 16, height '5\' 4"'$  (1.626 m), weight 79.6 kg, SpO2 94 %.  General: Pleasant, NAD Psych: Normal affect. Neuro: Alert and oriented X 3. Moves all extremities spontaneously. HEENT: Normal  Neck: Supple without bruits or JVD. Lungs:  Resp regular and unlabored, coarse breath sounds bilat. Heart: RRR no s3, s4, or murmurs. Abdomen: Soft, non-tender, non-distended, BS + x 4.  Extremities: No clubbing, cyanosis or edema. DP/PT2+, Radials 2+ and equal bilaterally.  Labs    Cardiac Enzymes Recent Labs  Lab 03/16/22 2152 03/17/22 0024 03/18/22 0428 03/18/22 0627  TROPONINIHS '8 8 6 5     '$ BNP    Component Value Date/Time   BNP 87.5 03/18/2022 0428    Lab Results  Component Value Date   WBC 7.4 03/16/2022   HGB 14.8 03/16/2022   HCT 45.3 03/16/2022   MCV 88.5 03/16/2022   PLT 180 03/16/2022    Recent Labs  Lab 03/16/22 2152  NA 135  K 3.5  CL 100  CO2 23  BUN 18  CREATININE 0.88  CALCIUM 8.3*  PROT 6.8  BILITOT 0.4  ALKPHOS 56  ALT 22  AST 24  GLUCOSE 153*   Lab Results  Component Value Date   HGBA1C 8.9 (H) 03/16/2022      Radiology Studies    DG Chest Port 1 View  Result Date: 03/18/2022 CLINICAL DATA:  Chest pain and heaviness EXAM: PORTABLE CHEST 1 VIEW COMPARISON:  03/16/2022 FINDINGS: Hazy density at the left apex not seen on recent prior. There could also be new subtle opacity in the right upper lung at the level of the posterior fifth rib. Interstitial coarsening that is generalized and stable. Normal heart size and mediastinal contours. No effusion or pneumothorax. IMPRESSION: New hazy density at the left apex suggesting pneumonia. Electronically Signed   By: Jorje Guild M.D.   On: 03/18/2022 05:20   DG Chest 2 View  Result Date: 03/16/2022 CLINICAL DATA:  Shortness of breath EXAM: CHEST - 2 VIEW COMPARISON:  CT chest dated  March 10, 2018 FINDINGS: The heart size and mediastinal contours are within normal limits. Atherosclerotic calcification of the aortic arch. Hyperinflated lungs suggesting COPD without evidence of focal consolidation or pleural effusion. Thoracic spondylosis. No acute osseous abnormality. IMPRESSION: 1. No active cardiopulmonary disease. 2. Hyperinflated lungs suggesting COPD. Electronically Signed   By: Keane Police D.O.   On: 03/16/2022 22:18   CT CHEST W CONTRAST  Result Date: 03/13/2022 CLINICAL DATA:  History of pneumonia.  Smoking history EXAM: CT CHEST WITH CONTRAST TECHNIQUE: Multidetector CT imaging of the chest was performed during intravenous contrast administration. RADIATION DOSE REDUCTION: This exam was performed according to the departmental dose-optimization program which includes automated exposure control, adjustment of the mA and/or kV according to patient size and/or use of iterative reconstruction technique. CONTRAST:  52m OMNIPAQUE IOHEXOL 300 MG/ML  SOLN COMPARISON:  CT chest 04/28/2021 FINDINGS: Cardiovascular: Normal heart size. No pericardial effusion. Thoracic aorta is nonaneurysmal. Scattered atherosclerotic vascular calcifications of the aorta and coronary arteries. Central pulmonary vasculature is nondilated. Mediastinum/Nodes: No axillary, hilar, or mediastinal lymphadenopathy. Trachea and esophagus within normal limits. 1.7 cm left thyroid lobe nodule. Lungs/Pleura: Mild-to-moderate centrilobular emphysema. 7 mm part solid subpleural pulmonary nodule in the right lower lobe (series 3, image 74), previously measured 4 mm. No airspace consolidation. No pleural effusion or pneumothorax. Upper Abdomen: Stable benign bilateral adrenal adenomas which do not require imaging follow-up. No acute findings within the imaged upper abdomen. Musculoskeletal: No chest wall abnormality. No acute or significant osseous findings. IMPRESSION: 1. Right lower lobe 7 mm part solid subpleural  pulmonary nodule, previously measured 4 mm. Adenocarcinoma is considered. Thoracic surgery consultation is recommended. These recommendations are taken from: Recommendations for the Management of Subsolid Pulmonary Nodules Detected at CT: A Statement from the FThayerRadiology 2013; 266:1, 304-317. 2. Lungs are otherwise clear.  No findings to suggest pneumonia. 3. 1.7 cm left thyroid lobe nodule. Recommend thyroid [place](ref: J Am Coll Radiol. 2015 Feb;12(2): 143-50).  4. Aortic and coronary artery atherosclerosis (ICD10-I70.0). 5. Emphysema (ICD10-J43.9). Electronically Signed   By: Davina Poke D.O.   On: 03/13/2022 09:49    ECG & Cardiac Imaging    03/16/2022 @ 2151: Sinus tachycardia, 115, baseline artifact, poor R progression - personally reviewed  03/18/2022 @ 0431: RSR, 83, no acute st/t changes - personally reviewed.  Assessment & Plan    1.  Precordial Chest Pain/Coronary Ca2+:  Pt w/ a h/o chest pain (chronic pain syndrome) and dyspnea.  Prior CT w/ extensive cor Ca2+.  Myoview in 07/2021 w/o ischemia.  Nl EF by echo in 08/2021.  She developed chest heaviness and dyspnea overnight while feeling as though her nasal cannula was not effectively delivery O2.  She says that the c/p was worse w/ deep breathing, palpation, and lying flat.  Symptoms resolved after several hours.  ECG w/o acute ST/T changes.  HsTrop nl.  Echo currently being done.  Provided that echo does not show any new wall motion abnormalities, in light of prolonged symptoms, absence of objective evidence of ischemia, and prior low-risk stress test, she should not need any additional ischemic evaluation.  Add ASA.  Home statin on hold in setting of paxlovid rx.  2.  COVID-19 Infection/PNA:  Recent AECOPD w/ outpt abx and steroids, w/o improvement.  Admitted 2/1 w/ hypoxemia and + COVID PCR.  Now on paxlovid, prednisone, nebs, and azithromycin - per IM/pulm.  3.  AECOPD:  See #2.  Per IM.  4.  Tob Abuse:  Ongoing @ home.   Cessation advised.  5.  RLL Nodule:  57m by recent CT.  Outpt pulm f/u.  6.  DMII:  A1c 8.9.  Per IM.  Needs tigher outpt control.  Risk Assessment/Risk Scores:     TIMI Risk Score for Unstable Angina or Non-ST Elevation MI:   The patient's TIMI risk score is 1, which indicates a 5% risk of all cause mortality, new or recurrent myocardial infarction or need for urgent revascularization in the next 14 days.      Signed, CMurray Hodgkins NP 03/18/2022, 11:02 AM  For questions or updates, please contact   Please consult [email or web address] for contact info under Cardiology/STEMI.

## 2022-03-18 NOTE — Progress Notes (Signed)
SATURATION QUALIFICATIONS: (This note is used to comply with regulatory documentation for home oxygen)  Patient Saturations on Room Air at Rest = 92%  Patient Saturations on Room Air while Ambulating = 89%  Patient Saturations on 2 Liters of oxygen while Ambulating = 96%  Please briefly explain why patient needs home oxygen:

## 2022-03-18 NOTE — Progress Notes (Signed)
  Echocardiogram 2D Echocardiogram has been performed.  Claretta Fraise 03/18/2022, 10:15 AM

## 2022-03-18 NOTE — Evaluation (Signed)
Physical Therapy Evaluation Patient Details Name: Laurie Berg MRN: [id number] DOB: 05-Aug-1960 Today's Date: 03/18/2022  History of Present Illness  62 y.o. female with medical history significant for CAD (cor Ca2+ on chest CT), diabetes, current smoker x40+ years COPD not on oxygen who presents to the ED with a several day history of progressively worsening shortness of breath not responding to home inhalers.  She was previously treated for outpatient pneumonia and completed antibiotics and a course of steroids a few days prior.  She denies chest pain, vomiting, abdominal pain or diarrhea.  She was saturating at 86% with EMS requiring 4 L to maintain sats in the mid 90s.  Clinical Impression  The pt presents with continued need for supplemental oxygen in order to participate in mobility and ADL tasks. The pt presents with decreased activity tolerance evidenced by the need for seated rest break following limited activity. The pt's oxygen decreased to 87% with limited ambulation and required seat rest break with cues for pursed lip breathing in order to recover to >90% after 20 sec. At this time the pt is expected to progress to d/c home with family care, but at this time could not safely do so without supplemental oxygen. PT will continue to follow to optimize activity tolerance and endurance.      Recommendations for follow up therapy are one component of a multi-disciplinary discharge planning process, led by the attending physician.  Recommendations may be updated based on patient status, additional functional criteria and insurance authorization.  Follow Up Recommendations No PT follow up      Assistance Recommended at Discharge PRN  Patient can return home with the following  A little help with walking and/or transfers;A little help with bathing/dressing/bathroom    Equipment Recommendations    Recommendations for Other Services       Functional Status Assessment Patient has had a  recent decline in their functional status and demonstrates the ability to make significant improvements in function in a reasonable and predictable amount of time.     Precautions / Restrictions Precautions Precautions: Fall Restrictions Weight Bearing Restrictions: No      Mobility  Bed Mobility Overal bed mobility: Modified Independent             General bed mobility comments: HOB elevated    Transfers Overall transfer level: Needs assistance Equipment used: None Transfers: Sit to/from Stand Sit to Stand: Supervision                Ambulation/Gait Ambulation/Gait assistance: Supervision Gait Distance (Feet): 15 Feet Assistive device: None Gait Pattern/deviations: WFL(Within Functional Limits)       General Gait Details: Gait distance limited by need for seated rest break d/t desaturation.  Stairs            Wheelchair Mobility    Modified Rankin (Stroke Patients Only)       Balance Overall balance assessment: Needs assistance Sitting-balance support: Feet supported Sitting balance-Leahy Scale: Good       Standing balance-Leahy Scale: Good                               Pertinent Vitals/Pain Pain Assessment Pain Assessment: No/denies pain    Home Living Family/patient expects to be discharged to:: Private residence Living Arrangements: Children Available Help at Discharge: Family;Other (Comment);Available PRN/intermittently Type of Home: Mobile home Home Access: Stairs to enter Entrance Stairs-Rails: Right Entrance Stairs-Number of Steps: 7  Home Layout: One level Home Equipment: None      Prior Function Prior Level of Function : Independent/Modified Independent;Driving               ADLs Comments: Independent with ADLs and iADLs     Hand Dominance   Dominant Hand: Right    Extremity/Trunk Assessment   Upper Extremity Assessment Upper Extremity Assessment: Overall WFL for tasks assessed    Lower  Extremity Assessment Lower Extremity Assessment: Overall WFL for tasks assessed    Cervical / Trunk Assessment Cervical / Trunk Assessment: Normal  Communication   Communication: No difficulties  Cognition Arousal/Alertness: Awake/alert Behavior During Therapy: WFL for tasks assessed/performed Overall Cognitive Status: Within Functional Limits for tasks assessed                                          General Comments      Exercises     Assessment/Plan    PT Assessment Patient needs continued PT services  PT Problem List Cardiopulmonary status limiting activity;Decreased activity tolerance       PT Treatment Interventions      PT Goals (Current goals can be found in the Care Plan section)  Acute Rehab PT Goals Patient Stated Goal: To return home without need for oxygen PT Goal Formulation: With patient/family Time For Goal Achievement: 03/25/22 Potential to Achieve Goals: Good    Frequency Min 2X/week     Co-evaluation               AM-PAC PT "6 Clicks" Mobility  Outcome Measure Help needed turning from your back to your side while in a flat bed without using bedrails?: A Little Help needed moving from lying on your back to sitting on the side of a flat bed without using bedrails?: A Little Help needed moving to and from a bed to a chair (including a wheelchair)?: A Little Help needed standing up from a chair using your arms (e.g., wheelchair or bedside chair)?: A Little Help needed to walk in hospital room?: A Little Help needed climbing 3-5 steps with a railing? : A Little 6 Click Score: 18    End of Session Equipment Utilized During Treatment: Oxygen Activity Tolerance: Patient tolerated treatment well Patient left: in bed;with family/visitor present Nurse Communication: Mobility status PT Visit Diagnosis: Unsteadiness on feet (R26.81)    Time: 4081-4481 PT Time Calculation (min) (ACUTE ONLY): 35 min   Charges:   PT  Evaluation $PT Eval Low Complexity: 1 Low PT Treatments $Gait Training: 23-37 mins        11:36 AM, 03/18/22 Zniya Cottone A. Saverio Danker PT, DPT Physical Therapist - [facility] A Jacqulene Huntley 03/18/2022, 11:32 AM

## 2022-03-18 NOTE — Progress Notes (Signed)
[facility] NAME: Laurie Berg    MR#:  [id number]  DATE OF BIRTH:  05-24-1960  SUBJECTIVE:  daughter  at bedside. Patient came in with fever of 102.6 and shortness of breath. Found to be covid positive.  Patient reported chest pain early hours of this morning. EKG within normal limits. No chest pain during my evaluation. Has some cough from COVID. Afebrile. VITALS:  Blood pressure 122/77, pulse 73, temperature 97.8 F (36.6 C), temperature source Oral, resp. rate 16, height '5\' 4"'$  (1.626 m), weight 79.6 kg, SpO2 96 %.  PHYSICAL EXAMINATION:   GENERAL:  62 y.o.-year-old patient with no acute distress. Obese LUNGS: Normal breath sounds bilaterally, no wheezing CARDIOVASCULAR: S1, S2 normal. No murmur   ABDOMEN: Soft, nontender, nondistended. Bowel sounds present.  EXTREMITIES: No  edema b/l.    NEUROLOGIC: nonfocal  patient is alert and awake SKIN: No obvious rash, lesion, or ulcer.   LABORATORY PANEL:  CBC Recent Labs  Lab 03/16/22 2152  WBC 7.4  HGB 14.8  HCT 45.3  PLT 180     Chemistries  Recent Labs  Lab 03/16/22 2152  NA 135  K 3.5  CL 100  CO2 23  GLUCOSE 153*  BUN 18  CREATININE 0.88  CALCIUM 8.3*  AST 24  ALT 22  ALKPHOS 56  BILITOT 0.4    Cardiac Enzymes No results for input(s): "TROPONINI" in the last 168 hours. RADIOLOGY:  DG Chest Port 1 View  Result Date: 03/18/2022 CLINICAL DATA:  Chest pain and heaviness EXAM: PORTABLE CHEST 1 VIEW COMPARISON:  03/16/2022 FINDINGS: Hazy density at the left apex not seen on recent prior. There could also be new subtle opacity in the right upper lung at the level of the posterior fifth rib. Interstitial coarsening that is generalized and stable. Normal heart size and mediastinal contours. No effusion or pneumothorax. IMPRESSION: New hazy density at the left apex suggesting pneumonia. Electronically Signed   By: Jorje Guild M.D.   On: 03/18/2022 05:20   DG  Chest 2 View  Result Date: 03/16/2022 CLINICAL DATA:  Shortness of breath EXAM: CHEST - 2 VIEW COMPARISON:  CT chest dated March 10, 2018 FINDINGS: The heart size and mediastinal contours are within normal limits. Atherosclerotic calcification of the aortic arch. Hyperinflated lungs suggesting COPD without evidence of focal consolidation or pleural effusion. Thoracic spondylosis. No acute osseous abnormality. IMPRESSION: 1. No active cardiopulmonary disease. 2. Hyperinflated lungs suggesting COPD. Electronically Signed   By: Keane Police D.O.   On: 03/16/2022 22:18    Assessment and Plan  Laurie Berg is a 62 y.o. female with medical history significant for CAD , diabetes, current smoker x40+ years COPD not on oxygen who presents to the ED with a several day history of progressively worsening shortness of breath not responding to home inhalers.  She was previously treated for outpatient pneumonia and completed antibiotics and a course of steroids a few days prior.  She denies chest pain, vomiting, abdominal pain or diarrhea.  She was sick to 86% with EMS requiring 4 L to maintain sats in the mid 90s.   Chest x-ray was nonacute showing hyperinflated lungs suggesting COPD.   COVID-19 virus infection Sepsis secondary to COVID-19 infection COPD exacerbation Acute respiratory failure with hypoxia --Sepsis criteria includes fever, tachycardia and tachypnea with respiratory failure requiring O2 at 3 L --WBC normal as well as lactic acid.   --pt started on Paxlovid.  --Discuss  with pulmonary Dr.Aleskerov-- recommends PO steroid taper, empiric Zithromax, IV Lasix times one. --Sepsis fluids --Albuterol every 6 and as needed, IV steroids --COVID precautions -- blood cultures negative -- tried to wean to room air. Assessor home oxygen use   Coronary artery disease CAD, three-vessel coronary calcification on chest CT 2023, seen by cardiology in consultation.  Normal echo and Myoview 04/2021   continue aspirin, resume lipitor after Paxlovid is completed cardiology consultation was done due to chest pain. So far workup has been benign. Patient is chest pain free.   Tobacco use disorder Nicotine patch  Type II diabetes, hypoglycemia, noncompliance to diet -- sugars up in the setting of IV steroids -- sliding scale insulin resume metformin    Procedures: Family communication : daughter at bedside Consults : pulmonary CODE STATUS: full DVT Prophylaxis : Lovenox Level of care: Telemetry Medical Status is: Inpatient Remains inpatient appropriate because: admitted with COVID infection. Patient also was found to be hypoxic. There is a good chance patient may need oxygen for home.  Anticipate discharge 2/4 if stable    TOTAL TIME TAKING CARE OF THIS PATIENT: 35 minutes.  >50% time spent on counselling and coordination of care  Note: This dictation was prepared with Dragon dictation along with smaller phrase technology. Any transcriptional errors that result from this process are unintentional.  [name] M.D    [facility]   CC: Primary care physician; Marguerita Merles, MD

## 2022-03-18 NOTE — Assessment & Plan Note (Signed)
Triple-vessel CAD with normal Myoview 07/2021 Patient developed chest pressure on the morning of 2/3 Treated with nitroglycerin and full dose aspirin Ordered EKG, troponin, BNP, chest x-ray Echocardiogram ordered to evaluate for segmental wall motion abnormality Cardiology consulted

## 2022-03-18 NOTE — Progress Notes (Signed)
Event progress Note   Patient: Laurie Berg TIW:[id number] DOB: [birth year] DOA: 03/16/2022     2 DOS: the patient was seen and examined on 03/18/2022   Event summary: Per nurse:"pt called out this morning stating that her oxygen is not working. VSS. 96% in 4L of [place]. She reports chest heaviness, not reproducible. Per pt chest heaviness is all over; mid, right and left. Please advise".   Patient admitted for sepsis secondary to COVID-19 infection with COPD exacerbation and acute respiratory failure who developed chest pain on the night of 2/3, described as a heaviness sitting in the center of her chest, intensity 6 out of 10 nonradiating and nonexertional.  She has no associated nausea, diaphoresis or palpitations and her shortness of breath is at baseline for her current illness.  Vitals were within normal limits.   Assessment and Plan: Chest pain Triple-vessel CAD with nonischemic Myoview 07/2021 Patient developed chest pressure on the morning of 2/3 Treated with nitroglycerin and full dose aspirin Continue Lipitor Ordered EKG, troponin, BNP, chest x-ray Echocardiogram ordered to evaluate for segmental wall motion abnormality Cardiology, [facility] consulted for additional recommendations Addendum: EKG was nonacute, troponin 6, BNP 87.5, chest x-ray showed developing hazy density left apex suggestive of pneumonia and troponin 6 and BNP 87.5  Possible pneumonia Chest x-ray on 2/3 showing hazy density left apex suggestive of pneumonia Patient currently on Paxlovid and azithromycin Procalcitonin on 2/2 was 0.19 Will repeat procalcitonin to evaluate for pneumonia  COVID-19 virus infection Sepsis secondary to COVID-19 infection COPD exacerbation Acute respiratory failure with hypoxia Continue current management  Tobacco use disorder Continue nicotine patch    Physical Exam: Vitals:   03/17/22 1716 03/17/22 1951 03/17/22 2100 03/18/22 0357  BP: 138/88 99/70  121/77  Pulse: 88 98  76   Resp: '16 20  20  '$ Temp: 98.2 F (36.8 C) 98.2 F (36.8 C) 98.1 F (36.7 C) 97.8 F (36.6 C)  TempSrc: Oral Oral Oral   SpO2: 96% 96%  96%  Weight:      Height:      Physical Exam Vitals and nursing note reviewed.  Constitutional:      General: She is not in acute distress. HENT:     Head: Normocephalic and atraumatic.  Cardiovascular:     Rate and Rhythm: Normal rate and regular rhythm.     Heart sounds: Normal heart sounds.  Pulmonary:     Effort: Tachypnea present.     Breath sounds: Normal breath sounds.  Abdominal:     Palpations: Abdomen is soft.     Tenderness: There is no abdominal tenderness.  Neurological:     Mental Status: Mental status is at baseline.    Data Reviewed: {HPI and today's progress notes reviewed along with recent labs, current medication Results for orders placed or performed during the hospital encounter of 03/16/22 (from the past 24 hour(s))  CBG monitoring, ED     Status: Abnormal   Collection Time: 03/17/22 10:03 AM  Result Value Ref Range   Glucose-Capillary 302 (H) 70 - 99 mg/dL  CBG monitoring, ED     Status: Abnormal   Collection Time: 03/17/22 12:55 PM  Result Value Ref Range   Glucose-Capillary 351 (H) 70 - 99 mg/dL  Glucose, capillary     Status: Abnormal   Collection Time: 03/17/22  5:31 PM  Result Value Ref Range   Glucose-Capillary 220 (H) 70 - 99 mg/dL   Comment 1 Notify RN    Comment 2 Document in  Chart   Glucose, capillary     Status: Abnormal   Collection Time: 03/17/22  9:36 PM  Result Value Ref Range   Glucose-Capillary 399 (H) 70 - 99 mg/dL  Troponin I (High Sensitivity)     Status: None   Collection Time: 03/18/22  4:28 AM  Result Value Ref Range   Troponin I (High Sensitivity) 6 <18 ng/L  Brain natriuretic peptide     Status: None   Collection Time: 03/18/22  4:28 AM  Result Value Ref Range   B Natriuretic Peptide 87.5 0.0 - 100.0 pg/mL   Chest x-ray and EKG results reviewed     Family Communication:  Daughter at bedside    CRITICAL CARE Performed by: Athena Masse   Total critical care time: 60  minutes  Critical care time was exclusive of separately billable procedures and treating other patients.  Critical care was necessary to treat or prevent imminent or life-threatening deterioration.  Critical care was time spent personally by me on the following activities: development of treatment plan with patient and/or surrogate as well as nursing, discussions with consultants, evaluation of patient's response to treatment, examination of patient, obtaining history from patient or surrogate, ordering and performing treatments and interventions, ordering and review of laboratory studies, ordering and review of radiographic studies, pulse oximetry and re-evaluation of patient's condition.   Author: Athena Masse, MD 03/18/2022 4:29 AM  For on call review [email or web address].

## 2022-03-18 NOTE — Plan of Care (Signed)
  Problem: Fluid Volume: Goal: Hemodynamic stability will improve Outcome: Progressing   Problem: Clinical Measurements: Goal: Diagnostic test results will improve Outcome: Progressing Goal: Signs and symptoms of infection will decrease Outcome: Progressing   Problem: Respiratory: Goal: Ability to maintain adequate ventilation will improve Outcome: Progressing   Problem: Education: Goal: Knowledge of risk factors and measures for prevention of condition will improve Outcome: Progressing   Problem: Coping: Goal: Psychosocial and spiritual needs will be supported Outcome: Progressing   Problem: Respiratory: Goal: Will maintain a patent airway Outcome: Progressing Goal: Complications related to the disease process, condition or treatment will be avoided or minimized Outcome: Progressing   Problem: Education: Goal: Ability to describe self-care measures that may prevent or decrease complications (Diabetes Survival Skills Education) will improve Outcome: Progressing Goal: Individualized Educational Video(s) Outcome: Progressing   Problem: Coping: Goal: Ability to adjust to condition or change in health will improve Outcome: Progressing   Problem: Fluid Volume: Goal: Ability to maintain a balanced intake and output will improve Outcome: Progressing   Problem: Health Behavior/Discharge Planning: Goal: Ability to identify and utilize available resources and services will improve Outcome: Progressing Goal: Ability to manage health-related needs will improve Outcome: Progressing   Problem: Metabolic: Goal: Ability to maintain appropriate glucose levels will improve Outcome: Progressing   Problem: Nutritional: Goal: Maintenance of adequate nutrition will improve Outcome: Progressing Goal: Progress toward achieving an optimal weight will improve Outcome: Progressing   Problem: Skin Integrity: Goal: Risk for impaired skin integrity will decrease Outcome: Progressing    Problem: Tissue Perfusion: Goal: Adequacy of tissue perfusion will improve Outcome: Progressing   Problem: Education: Goal: Knowledge of General Education information will improve Description: Including pain rating scale, medication(s)/side effects and non-pharmacologic comfort measures Outcome: Progressing   Problem: Health Behavior/Discharge Planning: Goal: Ability to manage health-related needs will improve Outcome: Progressing   Problem: Clinical Measurements: Goal: Ability to maintain clinical measurements within normal limits will improve Outcome: Progressing Goal: Will remain free from infection Outcome: Progressing Goal: Diagnostic test results will improve Outcome: Progressing Goal: Respiratory complications will improve Outcome: Progressing Goal: Cardiovascular complication will be avoided Outcome: Progressing   Problem: Activity: Goal: Risk for activity intolerance will decrease Outcome: Progressing   Problem: Nutrition: Goal: Adequate nutrition will be maintained Outcome: Progressing   Problem: Coping: Goal: Level of anxiety will decrease Outcome: Progressing   Problem: Elimination: Goal: Will not experience complications related to bowel motility Outcome: Progressing Goal: Will not experience complications related to urinary retention Outcome: Progressing   Problem: Pain Managment: Goal: General experience of comfort will improve Outcome: Progressing   Problem: Safety: Goal: Ability to remain free from injury will improve Outcome: Progressing   Problem: Skin Integrity: Goal: Risk for impaired skin integrity will decrease Outcome: Progressing

## 2022-03-19 DIAGNOSIS — J441 Chronic obstructive pulmonary disease with (acute) exacerbation: Secondary | ICD-10-CM | POA: Diagnosis not present

## 2022-03-19 LAB — COMPREHENSIVE METABOLIC PANEL
ALT: 33 U/L (ref 0–44)
AST: 31 U/L (ref 15–41)
Albumin: 3.1 g/dL — ABNORMAL LOW (ref 3.5–5.0)
Alkaline Phosphatase: 56 U/L (ref 38–126)
Anion gap: 10 (ref 5–15)
BUN: 18 mg/dL (ref 6–20)
CO2: 22 mmol/L (ref 22–32)
Calcium: 7.9 mg/dL — ABNORMAL LOW (ref 8.9–10.3)
Chloride: 104 mmol/L (ref 98–111)
Creatinine, Ser: 0.7 mg/dL (ref 0.44–1.00)
GFR, Estimated: >60 mL/min (ref >60)
Glucose, Bld: 231 mg/dL — ABNORMAL HIGH (ref 70–99)
Potassium: 3.7 mmol/L (ref 3.5–5.1)
Sodium: 136 mmol/L (ref 135–145)
Total Bilirubin: 0.5 mg/dL (ref 0.3–1.2)
Total Protein: 6.3 g/dL — ABNORMAL LOW (ref 6.5–8.1)

## 2022-03-19 LAB — GLUCOSE, CAPILLARY
Glucose-Capillary: 177 mg/dL — ABNORMAL HIGH (ref 70–99)
Glucose-Capillary: 344 mg/dL — ABNORMAL HIGH (ref 70–99)
Glucose-Capillary: 256 mg/dL — ABNORMAL HIGH (ref 70–99)
Glucose-Capillary: 199 mg/dL — ABNORMAL HIGH (ref 70–99)

## 2022-03-19 MED ORDER — METFORMIN HCL ER 500 MG PO TB24
1000.0000 mg | ORAL_TABLET | Freq: Two times a day (BID) | ORAL | Status: DC
  Administered 2022-03-19 – 2022-03-21 (×4): 1000 mg via ORAL
  Filled 2022-03-19 (×5): qty 2

## 2022-03-19 MED ORDER — FLUCONAZOLE 100 MG PO TABS
100.0000 mg | ORAL_TABLET | Freq: Every day | ORAL | Status: AC
  Administered 2022-03-19 – 2022-03-21 (×3): 100 mg via ORAL
  Filled 2022-03-19 (×3): qty 1

## 2022-03-19 MED ORDER — INSULIN ASPART 100 UNIT/ML IJ SOLN
0.0000 [IU] | Freq: Three times a day (TID) | INTRAMUSCULAR | Status: DC
  Administered 2022-03-19 – 2022-03-20 (×3): 11 [IU] via SUBCUTANEOUS
  Administered 2022-03-21: 3 [IU] via SUBCUTANEOUS
  Administered 2022-03-21: 7 [IU] via SUBCUTANEOUS
  Filled 2022-03-19 (×5): qty 1

## 2022-03-19 MED ORDER — PREDNISONE 20 MG PO TABS
40.0000 mg | ORAL_TABLET | Freq: Every day | ORAL | Status: DC
  Administered 2022-03-20: 40 mg via ORAL
  Filled 2022-03-19: qty 2

## 2022-03-19 NOTE — Progress Notes (Signed)
  PROGRESS NOTE    Laurie Berg  IPJ:[id number] DOB: November 20, 1960 DOA: 03/16/2022 PCP: Marguerita Merles, MD  [id number]  LOS: 3 days   Brief hospital course:   Assessment & Plan: Laurie Berg is a 62 y.o. female with medical history significant for CAD, diabetes, current smoker x40+ years, COPD not on oxygen who presented to the ED with several days of progressively worsening shortness of breath not responding to home inhalers.    She was previously treated for outpatient pneumonia and completed antibiotics and a course of steroids a few days prior.  She was hypoxic to 86% with EMS requiring 4 L to maintain sats in the mid 90s.    Chest x-ray was nonacute showing hyperinflated lungs suggesting COPD.    COVID-19 virus infection Sepsis secondary to COVID-19 infection COPD exacerbation Acute respiratory failure with hypoxia --Sepsis criteria includes fever, tachycardia and tachypnea with respiratory failure requiring O2 at 3 L --pt started on Paxlovid.  --Discuss with pulmonary Dr.Aleskerov-- recommends PO steroid taper, empiric Zithromax, IV Lasix times one. --weaned down to RA. Plan: --cont prednisone 40 mg daily --Albuterol every 6  --cont Paxlovid --flutter valve and IS  Coronary artery disease CAD, three-vessel coronary calcification on chest CT 2023.  Normal echo and Myoview 04/2021.  cardiology consultation was done due to chest pain. So far workup has been benign.   --cont ASA --resume statin after discharge   Tobacco use disorder Nicotine patch   Type II diabetes poorly controlled Hyperglycemia exacerbated by steroid use --A1c 8.9 --ACHS and SSI, resistant scale --increase metformin to 1000 mg BID  Thrush --pt developed pain on her tongue and around her gum that she said were typical of her thrush. --start fluconazole 100 mg daily x3 doses    DVT prophylaxis: Lovenox SQ Code Status: Full code  Family Communication: daughter updated at bedside today Level  of care: Telemetry Medical Dispo:   The patient is from: home Anticipated d/c is to: home Anticipated d/c date is: tomorrow   Subjective and Interval History:  Pt reported feeling worse and more congested overnight, developed worsening cough.   Objective: Vitals:   03/18/22 1553 03/18/22 1948 03/19/22 0450 03/19/22 0815  BP:  128/82 125/78 (!) 129/90  Pulse:  95 90 77  Resp:  '17 17 17  '$ Temp:  97.9 F (36.6 C) 97.9 F (36.6 C) 98.1 F (36.7 C)  TempSrc:   Oral   SpO2: 92% 90% 92% 93%  Weight:      Height:       No intake or output data in the 24 hours ending 03/19/22 1553 Filed Weights   03/16/22 2200  Weight: 79.6 kg    Examination:   Constitutional: NAD, AAOx3 HEENT: conjunctivae and lids normal, EOMI CV: No cyanosis.   RESP: normal respiratory effort, diffuse rhonchi and wheezes, on RA Neuro: II - XII grossly intact.   Psych: Normal mood and affect.  Appropriate judgement and reason   Data Reviewed: I have personally reviewed labs and imaging studies  Time spent: 50 minutes  Enzo Bi, MD [facility] If 7PM-7AM, please contact night-coverage 03/19/2022, 3:53 PM

## 2022-03-19 NOTE — Progress Notes (Signed)
PULMONOLOGY         Date: 03/19/2022,   MRN# [id number] Laurie Berg 09-18-1960     AdmissionWeight: 79.6 kg                 CurrentWeight: 79.6 kg  Referring provider: Dr. Lydia Guiles   CHIEF COMPLAINT:   Acute on chronic hypoxemic respiratory failure   HISTORY OF PRESENT ILLNESS   This is a very pleasant 62 year old female known to me from outpatient pulmonology clinic who was lifelong smoker with history of COPD and chronic hypoxemia.  He also has a background history of chronic pain syndrome, occult positive stools, colonic polyps, cervical radiculopathy history of sepsis and COVID-19.  Patient still currently smokes with minimal improvement post inhaler therapy outpatient.  Recent treatment with antimicrobials and steroids for acute exacerbation of COPD.  Patient came in with hypoxemia requiring 4 L supplemental O2 to reach 90% SpO2.  Also noted to have febrile flulike illness.  PCR for COVID came back positive.  PCCM consultation for additional evaluation management.  Reviewed previous CT chest from March 10, 2022 with findings of centrilobular emphysema mild congestion bilaterally and bronchitic COPD phenotype.   03/18/22- patient is improved today.  She did desaturate on ambulation despite having 3L/min [place]. She is less ronchorous today.  CRP is moderately elevated today.    03/19/22- patient reports forceful painful cough overnight with nighttime disturbances.  She feels tired today. She had cough syrup with codiene.  She's on paxlovid, there is no cmp today to monitor renal and hepatic function.  I have ordered this.  She's eating well.  Have reduced prednisone dose.  She said she's not ready to leave yet.   PAST MEDICAL HISTORY   Past Medical History:  Diagnosis Date   Cervical radiculopathy    Chronic pain syndrome    Colon polyps    COPD (chronic obstructive pulmonary disease) (HCC)    Coronary artery calcification seen on CT scan    a. 04/2021 High Res CT  Chest: Ao atherosclerosis, cor Ca2+; b. 07/2021 MV: EF 69%, no isch/infarct, LAD/LCX Ca2+ noted; c. 02/2022 CT Chest: Cor and Ao atherosclerosis.   COVID-19 virus infection    Diabetes mellitus without complication ([place])    History of echocardiogram    a. 08/2021 Echo: EF 55-60%, no rwma, nl RV fxn.   Lung nodule    a. 02/2022 CT Chest: RLL 31m solid subpleural pulm nodule, prev 44m->rec thoracic surgery eval.   [place]   Tobacco abuse      SURGICAL HISTORY   Past Surgical History:  Procedure Laterality Date   CARPAL TUNNEL RELEASE     COLONOSCOPY WITH PROPOFOL N/A 10/21/2019   Procedure: COLONOSCOPY WITH PROPOFOL;  Surgeon: TaVirgel ManifoldMD;  Location: [facility] ENDOSCOPY;  Service: Endoscopy;  Laterality: N/A;   DILATION AND CURETTAGE OF UTERUS     TUBAL LIGATION       FAMILY HISTORY   History reviewed. No pertinent family history.   SOCIAL HISTORY   Social History   Tobacco Use   Smoking status: Every Day    Packs/day: 1.75    Years: 48.00    Total pack years: 84.00    Types: Cigarettes   Smokeless tobacco: Never  Vaping Use   Vaping Use: Never used  Substance Use Topics   Alcohol use: Not Currently   Drug use: Never     MEDICATIONS    Home Medication:  Current Medication:  Current Facility-Administered Medications:    acetaminophen (TYLENOL) tablet 650 mg, 650 mg, Oral, Q6H PRN **OR** acetaminophen (TYLENOL) suppository 650 mg, 650 mg, Rectal, Q6H PRN, Athena Masse, MD   albuterol (VENTOLIN HFA) 108 (90 Base) MCG/ACT inhaler 2 puff, 2 puff, Inhalation, Q6H, Athena Masse, MD, 2 puff at 03/19/22 3557   ascorbic acid (VITAMIN C) tablet 500 mg, 500 mg, Oral, Daily, Ottie Glazier, MD, 500 mg at 03/19/22 3220   aspirin chewable tablet 81 mg, 81 mg, Oral, Daily, Theora Gianotti, NP, 81 mg at 03/19/22 0925   azithromycin (ZITHROMAX) tablet 250 mg, 250 mg, Oral, Daily, Ottie Glazier, MD, 250 mg at 03/19/22 2542    chlorpheniramine-HYDROcodone (TUSSIONEX) 10-8 MG/5ML suspension 5 mL, 5 mL, Oral, Q12H PRN, Athena Masse, MD, 5 mL at 03/19/22 0935   cyclobenzaprine (FLEXERIL) tablet 10 mg, 10 mg, Oral, BID PRN, [name], MD, 10 mg at 03/18/22 2242   DULoxetine (CYMBALTA) DR capsule 90 mg, 90 mg, Oral, Daily, [name], MD, 90 mg at 03/19/22 0925   enoxaparin (LOVENOX) injection 40 mg, 40 mg, Subcutaneous, QHS, Athena Masse, MD, 40 mg at 03/18/22 2208   feeding supplement (GLUCERNA SHAKE) (GLUCERNA SHAKE) liquid 237 mL, 237 mL, Oral, TID BM, [name], MD, 237 mL at 03/19/22 0926   fluconazole (DIFLUCAN) tablet 100 mg, 100 mg, Oral, Daily, Enzo Bi, MD, 100 mg at 03/19/22 0925   guaiFENesin (MUCINEX) 12 hr tablet 600 mg, 600 mg, Oral, BID, Athena Masse, MD, 600 mg at 03/18/22 2242   guaiFENesin-dextromethorphan (ROBITUSSIN DM) 100-10 MG/5ML syrup 10 mL, 10 mL, Oral, Q4H PRN, Athena Masse, MD, 10 mL at 03/19/22 0553   insulin aspart (novoLOG) injection 0-15 Units, 0-15 Units, Subcutaneous, TID WC, [name], MD, 3 Units at 03/19/22 0926   insulin aspart (novoLOG) injection 0-5 Units, 0-5 Units, Subcutaneous, QHS, [name], MD, 5 Units at 03/18/22 2209   ipratropium-albuterol (DUONEB) 0.5-2.5 (3) MG/3ML nebulizer solution 3 mL, 3 mL, Nebulization, Q4H PRN, [name], MD   metFORMIN (GLUCOPHAGE-XR) 24 hr tablet 500 mg, 500 mg, Oral, Q breakfast, [name], MD, 500 mg at 03/19/22 7062   multivitamin with minerals tablet 1 tablet, 1 tablet, Oral, Daily, [name], MD, 1 tablet at 03/19/22 3762   nicotine (NICODERM CQ - dosed in mg/24 hours) patch 21 mg, 21 mg, Transdermal, Daily, [name], MD, 21 mg at 03/19/22 8315   nirmatrelvir/ritonavir (PAXLOVID) 3 tablet, 3 tablet, Oral, BID, [name], MD, 3 tablet at 03/19/22 1761   nitroGLYCERIN (NITROSTAT) SL tablet 0.4 mg, 0.4 mg, Sublingual, Q5 min PRN, [name], MD, 0.4 mg at 03/18/22 0451   ondansetron (ZOFRAN) tablet 4 mg, 4  mg, Oral, Q6H PRN **OR** ondansetron (ZOFRAN) injection 4 mg, 4 mg, Intravenous, Q6H PRN, [name], MD   oxyCODONE (Oxy IR/ROXICODONE) immediate release tablet 5 mg, 5 mg, Oral, Q4H PRN, [name], MD, 5 mg at 03/18/22 2242   predniSONE (DELTASONE) tablet 50 mg, 50 mg, Oral, Q breakfast, Lanney Gins, Anamari Galeas, MD, 50 mg at 03/19/22 6073   zinc sulfate capsule 220 mg, 220 mg, Oral, Daily, Ottie Glazier, MD, 220 mg at 03/19/22 7106    ALLERGIES   Pregabalin and Sulfamethoxazole-trimethoprim     REVIEW OF SYSTEMS    Review of Systems:  Gen:  Denies  fever, sweats, chills weigh loss  HEENT: Denies blurred vision, double vision, ear pain, eye pain, hearing loss, nose bleeds, sore  throat Cardiac:  No dizziness, chest pain or heaviness, chest tightness,edema Resp:   reports dyspnea chronically  Gi: Denies swallowing difficulty, stomach pain, nausea or vomiting, diarrhea, constipation, bowel incontinence Gu:  Denies bladder incontinence, burning urine Ext:   Denies Joint pain, stiffness or swelling Skin: Denies  skin rash, easy bruising or bleeding or hives Endoc:  Denies polyuria, polydipsia , polyphagia or weight change Psych:   Denies depression, insomnia or hallucinations   Other:  All other systems negative   VS: BP (!) 129/90 (BP Location: Left Arm)   Pulse 77   Temp 98.1 F (36.7 C)   Resp 17   Ht '5\' 4"'$  (1.626 m)   Wt 79.6 kg   SpO2 93%   BMI 30.12 kg/m      PHYSICAL EXAM    GENERAL:NAD, no fevers, chills, no weakness no fatigue HEAD: Normocephalic, atraumatic.  EYES: Pupils equal, round, reactive to light. Extraocular muscles intact. No scleral icterus.  MOUTH: Moist mucosal membrane. Dentition intact. No abscess noted.  EAR, NOSE, THROAT: Clear without exudates. No external lesions.  NECK: Supple. No thyromegaly. No nodules. No JVD.  PULMONARY: decreased breath sounds with mild rhonchi worse at bases bilaterally.  CARDIOVASCULAR: S1 and S2. Regular  rate and rhythm. No murmurs, rubs, or gallops. No edema. Pedal pulses 2+ bilaterally.  GASTROINTESTINAL: Soft, nontender, nondistended. No masses. Positive bowel sounds. No hepatosplenomegaly.  MUSCULOSKELETAL: No swelling, clubbing, or edema. Range of motion full in all extremities.  NEUROLOGIC: Cranial nerves II through XII are intact. No gross focal neurological deficits. Sensation intact. Reflexes intact.  SKIN: No ulceration, lesions, rashes, or cyanosis. Skin warm and dry. Turgor intact.  PSYCHIATRIC: Mood, affect within normal limits. The patient is awake, alert and oriented x 3. Insight, judgment intact.       IMAGING        ASSESSMENT/PLAN      Acute COVID19 viral LRTI with COPD exacerbation -Remdesevir antiviral - has been dcd -vitamin C -zinc -switching solumedrol to prednisone 50 mg with tapering by '5mg'$  daily   -Diuresis - Lasix 20 IV daily - monitor UOP - utilize external urinary catheter if possible -encourage to use IS and Acapella device for bronchopulmonary hygiene when able -consider  -CRP -supportive care with ICU telemetry monitoring -PT/OT when possible -procalcitonin, CRP and ferritin trending -agree with nebulizer therapy routine q6h     Thank you for allowing me to participate in the care of this patient.   Patient/Family are satisfied with care plan and all questions have been answered.    Provider disclosure: Patient with at least one acute or chronic illness or injury that poses a threat to life or bodily function and is being managed actively during this encounter.  All of the below services have been performed independently by signing provider:  review of prior documentation from internal and or external health records.  Review of previous and current lab results.  Interview and comprehensive assessment during patient visit today. Review of current and previous chest radiographs/CT scans. Discussion of management and test interpretation with  health care team and patient/family.   This document was prepared using Dragon voice recognition software and may include unintentional dictation errors.     Ottie Glazier, M.D.  Division of Pulmonary & Critical Care Medicine

## 2022-03-20 ENCOUNTER — Inpatient Hospital Stay: Payer: Medicare HMO

## 2022-03-20 DIAGNOSIS — J441 Chronic obstructive pulmonary disease with (acute) exacerbation: Secondary | ICD-10-CM | POA: Diagnosis not present

## 2022-03-20 LAB — PROCALCITONIN: Procalcitonin: <0.1 ng/mL

## 2022-03-20 LAB — C-REACTIVE PROTEIN: CRP: 5 mg/dL — ABNORMAL HIGH (ref <1.0)

## 2022-03-20 LAB — GLUCOSE, CAPILLARY
Glucose-Capillary: 114 mg/dL — ABNORMAL HIGH (ref 70–99)
Glucose-Capillary: 279 mg/dL — ABNORMAL HIGH (ref 70–99)
Glucose-Capillary: 189 mg/dL — ABNORMAL HIGH (ref 70–99)
Glucose-Capillary: 266 mg/dL — ABNORMAL HIGH (ref 70–99)

## 2022-03-20 MED ORDER — PREDNISONE 20 MG PO TABS: 35.0000 mg | ORAL_TABLET | Freq: Every day | ORAL | Status: DC

## 2022-03-20 MED ORDER — FUROSEMIDE 10 MG/ML IJ SOLN
40.0000 mg | Freq: Every day | INTRAMUSCULAR | Status: AC
  Administered 2022-03-20 – 2022-03-21 (×2): 40 mg via INTRAVENOUS
  Filled 2022-03-20 (×2): qty 4

## 2022-03-20 MED ORDER — PREDNISONE 20 MG PO TABS: 20.0000 mg | ORAL_TABLET | Freq: Every day | ORAL | Status: DC

## 2022-03-20 MED ORDER — PREDNISONE 20 MG PO TABS: 25.0000 mg | ORAL_TABLET | Freq: Every day | ORAL | Status: DC

## 2022-03-20 MED ORDER — PREDNISONE 10 MG PO TABS: 5.0000 mg | ORAL_TABLET | Freq: Every day | ORAL | Status: DC

## 2022-03-20 MED ORDER — IPRATROPIUM-ALBUTEROL 0.5-2.5 (3) MG/3ML IN SOLN
3.0000 mL | Freq: Two times a day (BID) | RESPIRATORY_TRACT | Status: DC
  Administered 2022-03-20 (×2): 3 mL via RESPIRATORY_TRACT
  Filled 2022-03-20 (×2): qty 3

## 2022-03-20 MED ORDER — PREDNISONE 20 MG PO TABS
35.0000 mg | ORAL_TABLET | Freq: Every day | ORAL | Status: AC
  Administered 2022-03-21: 35 mg via ORAL
  Filled 2022-03-20: qty 2

## 2022-03-20 MED ORDER — PREDNISONE 20 MG PO TABS: 30.0000 mg | ORAL_TABLET | Freq: Every day | ORAL | Status: DC

## 2022-03-20 MED ORDER — PREDNISONE 10 MG PO TABS: 15.0000 mg | ORAL_TABLET | Freq: Every day | ORAL | Status: DC

## 2022-03-20 MED ORDER — PREDNISONE 10 MG PO TABS: 10.0000 mg | ORAL_TABLET | Freq: Every day | ORAL | Status: DC

## 2022-03-20 MED ORDER — ACETYLCYSTEINE 20 % IN SOLN
4.0000 mL | Freq: Two times a day (BID) | RESPIRATORY_TRACT | Status: DC
  Administered 2022-03-20: 4 mL via RESPIRATORY_TRACT
  Filled 2022-03-20 (×3): qty 4

## 2022-03-20 NOTE — TOC Progression Note (Signed)
Transition of Care [facility]) - Progression Note    Patient Details  Name: Laurie Berg MRN: [id number] Date of Birth: [birth year]  Transition of [facility]) CM/SW Contact  Beverly Sessions, RN Phone Number: 03/20/2022, 1:49 PM  Clinical Narrative:     Per OT patient is now agreeable to home health services.  Due to covid isolation spoke with patient via phone.  She is agreeable to services.  She states she does not have a preference of of agency.  Referral made to [place] with [facility] for RN, PT, OT, and aide \ Patient currently on 1 L acute O2   Expected Discharge Plan: [place] Barriers to Discharge: Continued Medical Work up  Expected Discharge Plan and [place] arrangements for the past 2 months: Single Family Home                                       Social Determinants of Health (SDOH) Interventions SDOH Screenings   Tobacco Use: High Risk (03/18/2022)    Readmission Risk Interventions     No data to display

## 2022-03-20 NOTE — Inpatient Diabetes Management (Signed)
Inpatient Diabetes Program Recommendations  AACE/ADA: New Consensus Statement on Inpatient Glycemic Control (2015)  Target Ranges:  Prepandial:   less than 140 mg/dL      Peak postprandial:   less than 180 mg/dL (1-2 hours)      Critically ill patients:  140 - 180 mg/dL    Latest Reference Range & Units 03/19/22 08:15 03/19/22 12:28 03/19/22 17:19 03/19/22 20:46  Glucose-Capillary 70 - 99 mg/dL 177 (H)  3 units Novolog '@0926'$  344 (H)  11 units Novolog  256 (H)  11 units Novolog '@1831'$  199 (H)  (H): Data is abnormally high  Latest Reference Range & Units 03/20/22 07:57 03/20/22 11:25  Glucose-Capillary 70 - 99 mg/dL 114 (H) 279 (H)  11 units Novolog   (H): Data is abnormally high    Home DM Meds: Glumetza 500 mg daily   Current Orders: Novolog Resistant Correction Scale/ SSI (0-20 units) TID AC + HS     Metformin 1000 mg BID     MD- Note both the Novolog SSI and the Metformin doses both increased yesterday afternoon.    Afternoon CBGs elevated likely due to the Prednisone  Please consider starting Novolog Meal Coverage: Novolog 4 units TID with meals HOLD if pt NPO HOLD if pt eats <50% meals    --Will follow patient during hospitalization--  Wyn Quaker RN, MSN, [place] Diabetes Coordinator Inpatient Glycemic Control Team Team Pager: [phone] (8a-5p)

## 2022-03-20 NOTE — Progress Notes (Signed)
PULMONOLOGY         Date: 03/20/2022,   MRN# [id number] Laurie Berg 09-07-1960     AdmissionWeight: 79.6 kg                 CurrentWeight: 79.6 kg  Referring provider: Dr. Lydia Guiles   CHIEF COMPLAINT:   Acute on chronic hypoxemic respiratory failure   HISTORY OF PRESENT ILLNESS   This is a very pleasant 62 year old female known to me from outpatient pulmonology clinic who was lifelong smoker with history of COPD and chronic hypoxemia.  He also has a background history of chronic pain syndrome, occult positive stools, colonic polyps, cervical radiculopathy history of sepsis and COVID-19.  Patient still currently smokes with minimal improvement post inhaler therapy outpatient.  Recent treatment with antimicrobials and steroids for acute exacerbation of COPD.  Patient came in with hypoxemia requiring 4 L supplemental O2 to reach 90% SpO2.  Also noted to have febrile flulike illness.  PCR for COVID came back positive.  PCCM consultation for additional evaluation management.  Reviewed previous CT chest from March 10, 2022 with findings of centrilobular emphysema mild congestion bilaterally and bronchitic COPD phenotype.   03/18/22- patient is improved today.  She did desaturate on ambulation despite having 3L/min [place]. She is less ronchorous today.  CRP is moderately elevated today.    03/19/22- patient reports forceful painful cough overnight with nighttime disturbances.  She feels tired today. She had cough syrup with codiene.  She's on paxlovid, there is no cmp today to monitor renal and hepatic function.  I have ordered this.  She's eating well.  Have reduced prednisone dose.  She said she's not ready to leave yet.   PAST MEDICAL HISTORY   Past Medical History:  Diagnosis Date   Cervical radiculopathy    Chronic pain syndrome    Colon polyps    COPD (chronic obstructive pulmonary disease) (HCC)    Coronary artery calcification seen on CT scan    a. 04/2021 High Res CT  Chest: Ao atherosclerosis, cor Ca2+; b. 07/2021 MV: EF 69%, no isch/infarct, LAD/LCX Ca2+ noted; c. 02/2022 CT Chest: Cor and Ao atherosclerosis.   COVID-19 virus infection    Diabetes mellitus without complication ([employer])    History of echocardiogram    a. 08/2021 Echo: EF 55-60%, no rwma, nl RV fxn.   Lung nodule    a. 02/2022 CT Chest: RLL 30m solid subpleural pulm nodule, prev 444m->rec thoracic surgery eval.   [place]   Tobacco abuse      SURGICAL HISTORY   Past Surgical History:  Procedure Laterality Date   CARPAL TUNNEL RELEASE     COLONOSCOPY WITH PROPOFOL N/A 10/21/2019   Procedure: COLONOSCOPY WITH PROPOFOL;  Surgeon: TaVirgel ManifoldMD;  Location: [facility] ENDOSCOPY;  Service: Endoscopy;  Laterality: N/A;   DILATION AND CURETTAGE OF UTERUS     TUBAL LIGATION       FAMILY HISTORY   History reviewed. No pertinent family history.   SOCIAL HISTORY   Social History   Tobacco Use   Smoking status: Every Day    Packs/day: 1.75    Years: 48.00    Total pack years: 84.00    Types: Cigarettes   Smokeless tobacco: Never  Vaping Use   Vaping Use: Never used  Substance Use Topics   Alcohol use: Not Currently   Drug use: Never     MEDICATIONS    Home Medication:  Current Medication:  Current Facility-Administered Medications:    acetaminophen (TYLENOL) tablet 650 mg, 650 mg, Oral, Q6H PRN **OR** acetaminophen (TYLENOL) suppository 650 mg, 650 mg, Rectal, Q6H PRN, Athena Masse, MD   acetylcysteine (MUCOMYST) 20 % nebulizer / oral solution 4 mL, 4 mL, Nebulization, BID, Enzo Bi, MD, 4 mL at 03/20/22 0925   albuterol (VENTOLIN HFA) 108 (90 Base) MCG/ACT inhaler 2 puff, 2 puff, Inhalation, Q6H, Athena Masse, MD, 2 puff at 03/20/22 0973   ascorbic acid (VITAMIN C) tablet 500 mg, 500 mg, Oral, Daily, Ottie Glazier, MD, 500 mg at 03/20/22 5329   aspirin chewable tablet 81 mg, 81 mg, Oral, Daily, Theora Gianotti, NP, 81 mg at 03/20/22 0914    azithromycin (ZITHROMAX) tablet 250 mg, 250 mg, Oral, Daily, Ottie Glazier, MD, 250 mg at 03/20/22 9242   chlorpheniramine-HYDROcodone (TUSSIONEX) 10-8 MG/5ML suspension 5 mL, 5 mL, Oral, Q12H PRN, Athena Masse, MD, 5 mL at 03/20/22 1219   cyclobenzaprine (FLEXERIL) tablet 10 mg, 10 mg, Oral, BID PRN, [name], MD, 10 mg at 03/18/22 2242   DULoxetine (CYMBALTA) DR capsule 90 mg, 90 mg, Oral, Daily, [name], MD, 90 mg at 03/20/22 0914   enoxaparin (LOVENOX) injection 40 mg, 40 mg, Subcutaneous, QHS, Athena Masse, MD, 40 mg at 03/19/22 2059   feeding supplement (GLUCERNA SHAKE) (GLUCERNA SHAKE) liquid 237 mL, 237 mL, Oral, TID BM, [name], MD, 237 mL at 03/20/22 0916   fluconazole (DIFLUCAN) tablet 100 mg, 100 mg, Oral, Daily, Enzo Bi, MD, 100 mg at 03/20/22 0914   guaiFENesin (MUCINEX) 12 hr tablet 600 mg, 600 mg, Oral, BID, Athena Masse, MD, 600 mg at 03/20/22 1218   guaiFENesin-dextromethorphan (ROBITUSSIN DM) 100-10 MG/5ML syrup 10 mL, 10 mL, Oral, Q4H PRN, Athena Masse, MD, 10 mL at 03/19/22 1557   insulin aspart (novoLOG) injection 0-20 Units, 0-20 Units, Subcutaneous, TID WC, Enzo Bi, MD, 11 Units at 03/20/22 1219   insulin aspart (novoLOG) injection 0-5 Units, 0-5 Units, Subcutaneous, QHS, [name], MD, 5 Units at 03/18/22 2209   ipratropium-albuterol (DUONEB) 0.5-2.5 (3) MG/3ML nebulizer solution 3 mL, 3 mL, Nebulization, Q4H PRN, [name], MD   ipratropium-albuterol (DUONEB) 0.5-2.5 (3) MG/3ML nebulizer solution 3 mL, 3 mL, Nebulization, BID, Enzo Bi, MD, 3 mL at 03/20/22 0925   metFORMIN (GLUCOPHAGE-XR) 24 hr tablet 1,000 mg, 1,000 mg, Oral, BID WC, Enzo Bi, MD, 1,000 mg at 03/20/22 6834   multivitamin with minerals tablet 1 tablet, 1 tablet, Oral, Daily, [name], MD, 1 tablet at 03/20/22 1962   nicotine (NICODERM CQ - dosed in mg/24 hours) patch 21 mg, 21 mg, Transdermal, Daily, [name], MD, 21 mg at 03/20/22 2297   nirmatrelvir/ritonavir  (PAXLOVID) 3 tablet, 3 tablet, Oral, BID, [name], MD, 3 tablet at 03/20/22 0915   nitroGLYCERIN (NITROSTAT) SL tablet 0.4 mg, 0.4 mg, Sublingual, Q5 min PRN, [name], MD, 0.4 mg at 03/18/22 0451   ondansetron (ZOFRAN) tablet 4 mg, 4 mg, Oral, Q6H PRN **OR** ondansetron (ZOFRAN) injection 4 mg, 4 mg, Intravenous, Q6H PRN, [name], MD   oxyCODONE (Oxy IR/ROXICODONE) immediate release tablet 5 mg, 5 mg, Oral, Q4H PRN, [name], MD, 5 mg at 03/20/22 0931   predniSONE (DELTASONE) tablet 40 mg, 40 mg, Oral, Q breakfast, [place] Gins, Maryna Yeagle, MD, 40 mg at 03/20/22 0913   zinc sulfate capsule 220 mg, 220 mg, Oral, Daily, Ottie Glazier, MD, 220 mg at 03/20/22 [id number]  ALLERGIES   Pregabalin and Sulfamethoxazole-trimethoprim     REVIEW OF SYSTEMS    Review of Systems:  Gen:  Denies  fever, sweats, chills weigh loss  HEENT: Denies blurred vision, double vision, ear pain, eye pain, hearing loss, nose bleeds, sore throat Cardiac:  No dizziness, chest pain or heaviness, chest tightness,edema Resp:   reports dyspnea chronically  Gi: Denies swallowing difficulty, stomach pain, nausea or vomiting, diarrhea, constipation, bowel incontinence Gu:  Denies bladder incontinence, burning urine Ext:   Denies Joint pain, stiffness or swelling Skin: Denies  skin rash, easy bruising or bleeding or hives Endoc:  Denies polyuria, polydipsia , polyphagia or weight change Psych:   Denies depression, insomnia or hallucinations   Other:  All other systems negative   VS: BP 126/88   Pulse 75   Temp 98.1 F (36.7 C) (Oral)   Resp 18   Ht '5\' 4"'$  (1.626 m)   Wt 79.6 kg   SpO2 92%   BMI 30.12 kg/m      PHYSICAL EXAM    GENERAL:NAD, no fevers, chills, no weakness no fatigue HEAD: Normocephalic, atraumatic.  EYES: Pupils equal, round, reactive to light. Extraocular muscles intact. No scleral icterus.  MOUTH: Moist mucosal membrane. Dentition intact. No abscess noted.  EAR, NOSE,  THROAT: Clear without exudates. No external lesions.  NECK: Supple. No thyromegaly. No nodules. No JVD.  PULMONARY: decreased breath sounds with mild rhonchi worse at bases bilaterally.  CARDIOVASCULAR: S1 and S2. Regular rate and rhythm. No murmurs, rubs, or gallops. No edema. Pedal pulses 2+ bilaterally.  GASTROINTESTINAL: Soft, nontender, nondistended. No masses. Positive bowel sounds. No hepatosplenomegaly.  MUSCULOSKELETAL: No swelling, clubbing, or edema. Range of motion full in all extremities.  NEUROLOGIC: Cranial nerves II through XII are intact. No gross focal neurological deficits. Sensation intact. Reflexes intact.  SKIN: No ulceration, lesions, rashes, or cyanosis. Skin warm and dry. Turgor intact.  PSYCHIATRIC: Mood, affect within normal limits. The patient is awake, alert and oriented x 3. Insight, judgment intact.       IMAGING            ASSESSMENT/PLAN      Acute COVID19 viral LRTI with COPD exacerbation -Remdesevir antiviral - has been dcd -vitamin C -zinc -switching solumedrol to prednisone '35mg'$  prednisone -Diuresis - Lasix 20 IV daily - monitor UOP - utilize external urinary catheter if possible -encourage to use IS and Acapella device for bronchopulmonary hygiene when able -consider  -CRP has imrpoved from 10 to 5  -supportive care with ICU telemetry monitoring -PT/OT when possible -procalcitonin, CRP and ferritin trending -agree with nebulizer therapy routine q6h     Thank you for allowing me to participate in the care of this patient.   Patient/Family are satisfied with care plan and all questions have been answered.    Provider disclosure: Patient with at least one acute or chronic illness or injury that poses a threat to life or bodily function and is being managed actively during this encounter.  All of the below services have been performed independently by signing provider:  review of prior documentation from internal and or external  health records.  Review of previous and current lab results.  Interview and comprehensive assessment during patient visit today. Review of current and previous chest radiographs/CT scans. Discussion of management and test interpretation with health care team and patient/family.   This document was prepared using Dragon voice recognition software and may include unintentional dictation errors.     Ottie Glazier, M.D.  Division of Pulmonary & Critical Care Medicine

## 2022-03-20 NOTE — Progress Notes (Signed)
  PROGRESS NOTE    Laurie Berg  WUJ:[id number] DOB: 08-17-1960 DOA: 03/16/2022 PCP: Marguerita Merles, MD  [id number]  LOS: 4 days   Brief hospital course:   Assessment & Plan: Laurie Berg is a 62 y.o. female with medical history significant for CAD, diabetes, current smoker x40+ years, COPD not on oxygen who presented to the ED with several days of progressively worsening shortness of breath not responding to home inhalers.    She was previously treated for outpatient pneumonia and completed antibiotics and a course of steroids a few days prior.  She was hypoxic to 86% with EMS requiring 4 L to maintain sats in the mid 90s.    Chest x-ray was nonacute showing hyperinflated lungs suggesting COPD.    COVID-19 virus infection Sepsis secondary to COVID-19 infection COPD exacerbation Acute respiratory failure with hypoxia --Sepsis criteria includes fever, tachycardia and tachypnea with respiratory failure requiring O2 at 3 L --pt started on Paxlovid.  --Discuss with pulmonary Dr. Lanney Gins-- recommends PO steroid taper, empiric Zithromax, IV Lasix times one. --weaned down to RA. Plan: --cont prednisone with taper --Albuterol every 6  --cont Paxlovid --flutter valve and IS --add Mucomyst neb BID  Coronary artery disease CAD, three-vessel coronary calcification on chest CT 2023.  Normal echo and Myoview 04/2021.  cardiology consultation was done due to chest pain. So far workup has been benign.   --cont ASA --resume statin after discharge   Tobacco use disorder Nicotine patch   Type II diabetes poorly controlled Hyperglycemia exacerbated by steroid use --A1c 8.9 --ACHS and SSI, resistant scale --cont metformin at increased 1000 mg BID  Thrush --pt developed pain on her tongue and around her gum that she said were typical of her thrush. --cont fluconazole 100 mg daily day 2 of 3    DVT prophylaxis: Lovenox SQ Code Status: Full code  Family Communication:  Level of  care: Telemetry Medical Dispo:   The patient is from: home Anticipated d/c is to: home Anticipated d/c date is: tomorrow   Subjective and Interval History:  Pt had more sputum production but felt like more needed to come up.   Objective: Vitals:   03/19/22 2057 03/20/22 0802 03/20/22 0805 03/20/22 1657  BP: (!) 160/91  126/88 (!) 141/94  Pulse: 76 77 75 75  Resp: '18 18  18  '$ Temp: 98 F (36.7 C) 98.1 F (36.7 C)  98 F (36.7 C)  TempSrc: Oral Oral  Oral  SpO2: 94% 92%  93%  Weight:      Height:       No intake or output data in the 24 hours ending 03/20/22 1902 Filed Weights   03/16/22 2200  Weight: 79.6 kg    Examination:   Constitutional: NAD, AAOx3 HEENT: conjunctivae and lids normal, EOMI CV: No cyanosis.   RESP: normal respiratory effort Neuro: II - XII grossly intact.   Psych: Normal mood and affect.  Appropriate judgement and reason   Data Reviewed: I have personally reviewed labs and imaging studies  Time spent: 35 minutes  Enzo Bi, MD [facility] If 7PM-7AM, please contact night-coverage 03/20/2022, 7:02 PM

## 2022-03-20 NOTE — Progress Notes (Signed)
Occupational Therapy Treatment Patient Details Name: Laurie Berg MRN: [id number] DOB: 12-31-1960 Today's Date: 03/20/2022   History of present illness 62 y.o. female with medical history significant for CAD (cor Ca2+ on chest CT), diabetes, current smoker x40+ years COPD not on oxygen who presents to the ED with a several day history of progressively worsening shortness of breath not responding to home inhalers.  She was previously treated for outpatient pneumonia and completed antibiotics and a course of steroids a few days prior.  She denies chest pain, vomiting, abdominal pain or diarrhea.  She was sick to 86% with EMS requiring 4 L to maintain sats in the mid 90s.   OT comments  Laurie Berg was seen for OT treatment on this date. Upon arrival to room pt awake/alert with supportive family at bedside. Pt agreeable to OT tx session and interested in learning strategies to support safety, independence, and energy conservation during ADL management. Pt educated on energy conservation strategies for improved safety and functional independence during ADL management. She continues to require MIN GUARD for functional mobility and cueing to implement learned ECS into functional tasks. Per daughter at bedside, family is concerned regarding pt ability to safely perform necessary functional activity at home. Discussed DC recs for home health with aid to supplement familial support. Pt continues to benefit from skilled OT services to maximize return to PLOF and minimize risk of future falls, injury, caregiver burden, and readmission. Will continue to follow POC. Discharge recommendation remains appropriate.     Recommendations for follow up therapy are one component of a multi-disciplinary discharge planning process, led by the attending physician.  Recommendations may be updated based on patient status, additional functional criteria and insurance authorization.    Follow Up Recommendations  Home health OT      Assistance Recommended at Discharge Intermittent Supervision/Assistance  Patient can return home with the following  A little help with walking and/or transfers;A little help with bathing/dressing/bathroom;Help with stairs or ramp for entrance;Assist for transportation;Assistance with cooking/housework   Equipment Recommendations       Recommendations for Other Services      Precautions / Restrictions Precautions Precautions: Fall Restrictions Weight Bearing Restrictions: No       Mobility Bed Mobility Overal bed mobility: Modified Independent             General bed mobility comments: HOB elevated    Transfers Overall transfer level: Needs assistance Equipment used: None Transfers: Sit to/from Stand Sit to Stand: Supervision     Step pivot transfers: Min guard     General transfer comment: Min guard for functional mobility in room.     Balance Overall balance assessment: Needs assistance Sitting-balance support: Feet supported Sitting balance-Leahy Scale: Good     Standing balance support: During functional activity, No upper extremity supported Standing balance-Leahy Scale: Good Standing balance comment: fatigues quickly, desat noted with increased distance (~10 feet from recliner to door)                           ADL either performed or assessed with clinical judgement   ADL Overall ADL's : Needs assistance/impaired                                     Functional mobility during ADLs: Min guard;Cueing for safety General ADL Comments: Pt educated on energy conservation strategies for improved  safety and functional independence during ADL management. She continues to require MIN GUARD for functional mobility and cueing to implement learned ECS into functional tasks. Per daughter at bedside, family is concerned regarding pt ability to safely perform necessary functional activity at home. Discussed DC recs for home health with  aid to supplement familial support.    Extremity/Trunk Assessment Upper Extremity Assessment Upper Extremity Assessment: Overall WFL for tasks assessed   Lower Extremity Assessment Lower Extremity Assessment: Overall WFL for tasks assessed   Cervical / Trunk Assessment Cervical / Trunk Assessment: Normal    Vision Patient Visual Report: No change from baseline     Perception     Praxis      Cognition Arousal/Alertness: Awake/alert Behavior During Therapy: WFL for tasks assessed/performed Overall Cognitive Status: Within Functional Limits for tasks assessed                                          Exercises Other Exercises Other Exercises: Pt/dtr educated on role of OT in acute setting, energy conservation strategies, incentive spirometer use, safe use of AE/DME for ADL management, DC recs, and routines modifications to support safety and functional independence upon hospital DC.    Shoulder Instructions       General Comments Pt on RA upon arrival, states staff instructed her to remove [place]. SpO2 at rest is noted to be 90-93%. She is able to sustain during brief bouds of functional activity, however, desat to 87% noted with short functional transfer from recliner to room door. Improves with cues for PLB and therapeutic rest break. SpO2 noted to be 92% at end of session.    Pertinent Vitals/ Pain       Pain Assessment Pain Assessment: No/denies pain  Home Living                                          Prior Functioning/Environment              Frequency  Min 2X/week        Progress Toward Goals  OT Goals(current goals can now be found in the care plan section)  Progress towards OT goals: Progressing toward goals  Acute Rehab OT Goals Patient Stated Goal: to go home OT Goal Formulation: With patient/family Time For Goal Achievement: 03/31/22 Potential to Achieve Goals: Good  Plan Discharge plan remains  appropriate;Frequency remains appropriate    Co-evaluation                 AM-PAC OT "6 Clicks" Daily Activity     Outcome Measure   Help from another person eating meals?: None Help from another person taking care of personal grooming?: None Help from another person toileting, which includes using toliet, bedpan, or urinal?: A Little Help from another person bathing (including washing, rinsing, drying)?: A Little Help from another person to put on and taking off regular upper body clothing?: None Help from another person to put on and taking off regular lower body clothing?: A Little 6 Click Score: 21    End of Session    OT Visit Diagnosis: Muscle weakness (generalized) (M62.81)   Activity Tolerance Patient tolerated treatment well   Patient Left in chair;with family/visitor present (Pt recieved with fall alarms disabled, up ad lib in room  with supportive family present at bedside.)   Nurse Communication          Time: [id number] OT Time Calculation (min): 23 min  Charges: OT General Charges $OT Visit: 1 Visit OT Treatments $Self Care/Home Management : 23-37 mins  Shara Blazing, M.S., OTR/L 03/20/22, 11:49 AM

## 2022-03-21 ENCOUNTER — Inpatient Hospital Stay: Payer: Medicare HMO

## 2022-03-21 DIAGNOSIS — J441 Chronic obstructive pulmonary disease with (acute) exacerbation: Secondary | ICD-10-CM | POA: Diagnosis not present

## 2022-03-21 LAB — BASIC METABOLIC PANEL
Anion gap: 11 (ref 5–15)
BUN: 21 mg/dL — ABNORMAL HIGH (ref 6–20)
CO2: 26 mmol/L (ref 22–32)
Calcium: 8.4 mg/dL — ABNORMAL LOW (ref 8.9–10.3)
Chloride: 100 mmol/L (ref 98–111)
Creatinine, Ser: 0.78 mg/dL (ref 0.44–1.00)
GFR, Estimated: >60 mL/min (ref >60)
Glucose, Bld: 127 mg/dL — ABNORMAL HIGH (ref 70–99)
Potassium: 3.7 mmol/L (ref 3.5–5.1)
Sodium: 137 mmol/L (ref 135–145)

## 2022-03-21 LAB — CULTURE, BLOOD (ROUTINE X 2)
Culture: NO GROWTH
Culture: NO GROWTH
Special Requests: ADEQUATE

## 2022-03-21 LAB — CBC
HCT: 40.1 % (ref 36.0–46.0)
Hemoglobin: 13.1 g/dL (ref 12.0–15.0)
MCH: 28.9 pg (ref 26.0–34.0)
MCHC: 32.7 g/dL (ref 30.0–36.0)
MCV: 88.5 fL (ref 80.0–100.0)
Platelets: 293 10*3/uL (ref 150–400)
RBC: 4.53 MIL/uL (ref 3.87–5.11)
RDW: 15.3 % (ref 11.5–15.5)
WBC: 12.8 10*3/uL — ABNORMAL HIGH (ref 4.0–10.5)
nRBC: 0 % (ref 0.0–0.2)

## 2022-03-21 LAB — GLUCOSE, CAPILLARY
Glucose-Capillary: 146 mg/dL — ABNORMAL HIGH (ref 70–99)
Glucose-Capillary: 234 mg/dL — ABNORMAL HIGH (ref 70–99)

## 2022-03-21 LAB — MAGNESIUM: Magnesium: 1.9 mg/dL (ref 1.7–2.4)

## 2022-03-21 MED ORDER — NIRMATRELVIR/RITONAVIR (PAXLOVID)TABLET: 3.0000 | ORAL_TABLET | Freq: Two times a day (BID) | ORAL | 0 refills | Status: AC

## 2022-03-21 MED ORDER — GUAIFENESIN ER 600 MG PO TB12: 600.0000 mg | ORAL_TABLET | Freq: Two times a day (BID) | ORAL | 0 refills | Status: AC

## 2022-03-21 MED ORDER — IPRATROPIUM-ALBUTEROL 0.5-2.5 (3) MG/3ML IN SOLN
3.0000 mL | Freq: Two times a day (BID) | RESPIRATORY_TRACT | Status: DC
  Administered 2022-03-21: 3 mL via RESPIRATORY_TRACT
  Filled 2022-03-21: qty 3

## 2022-03-21 MED ORDER — HYDROCOD POLI-CHLORPHE POLI ER 10-8 MG/5ML PO SUER: 5.0000 mL | Freq: Two times a day (BID) | ORAL | 0 refills | Status: AC | PRN

## 2022-03-21 MED ORDER — ASPIRIN 81 MG PO CHEW: 81.0000 mg | CHEWABLE_TABLET | Freq: Every day | ORAL | Status: DC

## 2022-03-21 MED ORDER — LANCET DEVICE MISC: 1.0000 | Freq: Three times a day (TID) | 0 refills | Status: AC

## 2022-03-21 MED ORDER — ADULT MULTIVITAMIN W/MINERALS CH: 1.0000 | ORAL_TABLET | Freq: Every day | ORAL | Status: AC

## 2022-03-21 MED ORDER — BLOOD GLUCOSE TEST VI STRP: 1.0000 | ORAL_STRIP | Freq: Three times a day (TID) | 0 refills | Status: AC

## 2022-03-21 MED ORDER — ACETYLCYSTEINE 20 % IN SOLN
4.0000 mL | Freq: Two times a day (BID) | RESPIRATORY_TRACT | Status: DC
  Filled 2022-03-21: qty 4

## 2022-03-21 MED ORDER — LANCETS MISC. MISC: 1.0000 | Freq: Three times a day (TID) | 0 refills | Status: AC

## 2022-03-21 MED ORDER — IPRATROPIUM-ALBUTEROL 0.5-2.5 (3) MG/3ML IN SOLN: 3.0000 mL | Freq: Two times a day (BID) | RESPIRATORY_TRACT | Status: DC

## 2022-03-21 MED ORDER — ZINC SULFATE 220 (50 ZN) MG PO CAPS: 220.0000 mg | ORAL_CAPSULE | Freq: Every day | ORAL | 0 refills | Status: AC

## 2022-03-21 MED ORDER — METFORMIN HCL ER (MOD) 500 MG PO TB24: 1000.0000 mg | ORAL_TABLET | Freq: Two times a day (BID) | ORAL | 2 refills | Status: DC

## 2022-03-21 MED ORDER — IPRATROPIUM-ALBUTEROL 0.5-2.5 (3) MG/3ML IN SOLN: 3.0000 mL | Freq: Four times a day (QID) | RESPIRATORY_TRACT | 0 refills | Status: AC | PRN

## 2022-03-21 MED ORDER — IPRATROPIUM-ALBUTEROL 20-100 MCG/ACT IN AERS
1.0000 | INHALATION_SPRAY | Freq: Two times a day (BID) | RESPIRATORY_TRACT | Status: DC
  Filled 2022-03-21: qty 4

## 2022-03-21 MED ORDER — ACETYLCYSTEINE 20 % IN SOLN
4.0000 mL | Freq: Two times a day (BID) | RESPIRATORY_TRACT | Status: DC
  Administered 2022-03-21: 4 mL via RESPIRATORY_TRACT
  Filled 2022-03-21 (×2): qty 4

## 2022-03-21 MED ORDER — NICOTINE 21 MG/24HR TD PT24: 21.0000 mg | MEDICATED_PATCH | Freq: Every day | TRANSDERMAL | 0 refills | Status: DC

## 2022-03-21 MED ORDER — BLOOD GLUCOSE MONITORING SUPPL DEVI: 1.0000 | Freq: Three times a day (TID) | 0 refills | Status: AC

## 2022-03-21 MED ORDER — PREDNISONE 5 MG PO TABS: ORAL_TABLET | ORAL | 0 refills | Status: DC

## 2022-03-21 MED ORDER — ASCORBIC ACID 500 MG PO TABS: 500.0000 mg | ORAL_TABLET | Freq: Every day | ORAL | 0 refills | Status: AC

## 2022-03-21 NOTE — Discharge Summary (Signed)
Physician Discharge Summary   SIBEL KHURANA  female DOB: [birth year]  RDE:[id number]  PCP: Marguerita Merles, MD  Admit date: 03/16/2022 Discharge date: 03/21/2022  Admitted From: home Disposition:  home CODE STATUS: Full code  Discharge Instructions     Amb Referral to Nutrition and Diabetic Education   Complete by: As directed    Diet Carb Modified   Complete by: As directed    Discharge instructions   Complete by: As directed    Please complete Paxlovid and prednisone taper as directed at home.  I have increased your metformin to 1000 mg twice daily.   Dr. Enzo Bi [facility] Course:  For full details, please see H&P, progress notes, consult notes and ancillary notes.  Briefly,  MILDA LINDVALL is a 62 y.o. female with medical history significant for CAD, diabetes, current smoker x40+ years, COPD not on oxygen who presented to the ED with several days of progressively worsening shortness of breath not responding to home inhalers.     She was previously treated for outpatient pneumonia and completed antibiotics and a course of steroids a few days prior.  She was hypoxic to 86% with EMS requiring 4 L to maintain sats in the mid 90s.    Chest x-ray was nonacute showing hyperinflated lungs suggesting COPD.    COVID-19 virus infection Sepsis secondary to COVID-19 infection COPD exacerbation Acute respiratory failure with hypoxia --Sepsis criteria includes fever, tachycardia and tachypnea with respiratory failure requiring supplemental oxygen. --pt started on Paxlovid, empiric azithromycin and steroid.  Pulm consulted with DR. Aleskerov. --Pt received mucomyst neb for mucus clearance. --pt was discharged on prednisone taper, and 1L supplemental oxygen. --cont home Trelegy and DuoNeb PRN. --outpatient f/u with pulm Dr. Lanney Gins  Coronary artery disease CAD, three-vessel coronary calcification on chest CT 2023.  Normal echo and Myoview 04/2021.  cardiology  consultation was done due to chest pain. So far workup has been benign.   --cont ASA --resume statin after discharge   Tobacco use disorder Nicotine patch   Type II diabetes poorly controlled Hyperglycemia exacerbated by steroid use --A1c 8.9 --received ACHS and SSI, resistant scale while inpatient. --discharged on metformin at increased 1000 mg BID   Thrush --pt developed pain on her tongue and around her gum that she said were typical of her thrush. --completed 3 days of fluconazole 100 mg daily   On Chronic opioids --cont home regimen   Discharge Diagnoses:  Principal Problem:   COPD with acute exacerbation ([place]) Active Problems:   COVID-19 virus infection   COPD with acute bronchitis ([place])   Sepsis ([place])   Acute respiratory failure with hypoxia ([facility])   Chest pain   Tobacco use disorder   Coronary artery disease   30 Day Unplanned Readmission Risk Score    Flowsheet Row ED to Hosp-Admission (Current) from 03/16/2022 in [place]  30 Day Unplanned Readmission Risk Score (%) 16.35 Filed at 03/21/2022 0801       This score is the patient's risk of an unplanned readmission within 30 days of being discharged (0 -100%). The score is based on dignosis, age, lab data, medications, orders, and past utilization.   Low:  0-14.9   Medium: 15-21.9   High: 22-29.9   Extreme: 30 and above         Discharge Instructions:  Allergies as of 03/21/2022       Reactions   Pregabalin Shortness Of Breath,  Swelling   "Felt like I couldn't breathe and my tongue was swelling up"   Sulfamethoxazole-trimethoprim Itching        Medication List     STOP taking these medications    nystatin 100000 UNIT/ML suspension Commonly known as: MYCOSTATIN       TAKE these medications    albuterol 108 (90 Base) MCG/ACT inhaler Commonly known as: VENTOLIN HFA Inhale 1 puff into the lungs as needed.   ascorbic acid 500 MG tablet Commonly known  as: VITAMIN C Take 1 tablet (500 mg total) by mouth daily. Start taking on: March 22, 2022   aspirin 81 MG chewable tablet Chew 1 tablet (81 mg total) by mouth daily. Start taking on: March 22, 2022   atorvastatin 20 MG tablet Commonly known as: LIPITOR Take 1 tablet (20 mg total) by mouth daily.   BC HEADACHE POWDER PO Take 1 packet by mouth as needed (pain).   Blood Glucose Monitoring Suppl Devi 1 each by Does not apply route in the morning, at noon, and at bedtime. May substitute to any manufacturer covered by patient's insurance.   BLOOD GLUCOSE TEST STRIPS Strp 1 each by In Vitro route in the morning, at noon, and at bedtime. May substitute to any manufacturer covered by patient's insurance.   cetirizine 10 MG tablet Commonly known as: ZYRTEC Take 10 mg by mouth daily.   chlorpheniramine-HYDROcodone 10-8 MG/5ML Commonly known as: TUSSIONEX Take 5 mLs by mouth every 12 (twelve) hours as needed for up to 7 days for cough.   cyclobenzaprine 10 MG tablet Commonly known as: FLEXERIL Take 10 mg by mouth 2 (two) times daily as needed for muscle spasms.   DULoxetine 30 MG capsule Commonly known as: CYMBALTA Take 90 mg by mouth daily.   guaiFENesin 600 MG 12 hr tablet Commonly known as: MUCINEX Take 1 tablet (600 mg total) by mouth 2 (two) times daily for 7 days.   ipratropium-albuterol 0.5-2.5 (3) MG/3ML Soln Commonly known as: DUONEB Take 3 mLs by nebulization every 6 (six) hours as needed. What changed:  when to take this reasons to take this   Lancet Device Misc 1 each by Does not apply route in the morning, at noon, and at bedtime. May substitute to any manufacturer covered by patient's insurance.   Lancets Misc. Misc 1 each by Does not apply route in the morning, at noon, and at bedtime. May substitute to any manufacturer covered by patient's insurance.   Linzess 72 MCG capsule Generic drug: linaclotide Take 72 mcg by mouth every morning.   metFORMIN  500 MG (MOD) 24 hr tablet Commonly known as: GLUMETZA Take 2 tablets (1,000 mg total) by mouth 2 (two) times daily with a meal. What changed:  how much to take when to take this   multivitamin with minerals Tabs tablet Take 1 tablet by mouth daily. Start taking on: March 22, 2022   naloxone 4 MG/0.1ML Liqd nasal spray kit Commonly known as: NARCAN One spray in either nostril once for known/suspected opioid overdose. May repeat every 2-3 minutes in alternating nostril til EMS arrives   nicotine 21 mg/24hr patch Commonly known as: NICODERM CQ - dosed in mg/24 hours Place 1 patch (21 mg total) onto the skin daily. Start taking on: March 22, 2022   nirmatrelvir/ritonavir 20 x 150 MG & 10 x '100MG'$  Tabs Commonly known as: PAXLOVID Take 3 tablets by mouth 2 (two) times daily for 5 days. Finish the course as directed.   oxyCODONE-acetaminophen 5-325  MG tablet Commonly known as: PERCOCET/ROXICET Take 1 tablet by mouth every 6 (six) hours as needed.   OxyCONTIN 30 MG 12 hr tablet Generic drug: oxyCODONE Take 1 tablet by mouth 2 (two) times daily.   predniSONE 5 MG tablet Commonly known as: DELTASONE Take 6 tablets (30 mg) on 2/7, 5 tablets on 2/8, 4 tablets on 2/9, 3 tablets on 2/10, 2 tablets on 2/11, 1 tablet on 2/12, then done. Start taking on: March 27, 2022   Trelegy Ellipta 100-62.5-25 MCG/ACT Aepb Generic drug: Fluticasone-Umeclidin-Vilant Take 1 puff by mouth daily.   zinc sulfate 220 (50 Zn) MG capsule Take 1 capsule (220 mg total) by mouth daily. Start taking on: March 22, 2022               Durable Medical Equipment  (From admission, onward)           Start     Ordered   03/21/22 1351  For home use only DME oxygen  Once       Question Answer Comment  Length of Need 6 Months   Mode or (Route) Nasal cannula   Liters per Minute 1   Frequency Continuous (stationary and portable oxygen unit needed)   Oxygen conserving device Yes   Oxygen  delivery system Gas      03/21/22 1350             Follow-up Information     Marguerita Merles, MD. Go in 1 week(s).   Specialty: Family Medicine Why: 03/27/22 3:20 PM Contact information: [place] [address] [phone]                 Allergies  Allergen Reactions   Pregabalin Shortness Of Breath and Swelling    "Felt like I couldn't breathe and my tongue was swelling up"   Sulfamethoxazole-Trimethoprim Itching     The results of significant diagnostics from this hospitalization (including imaging, microbiology, ancillary and laboratory) are listed below for reference.   Consultations:   Procedures/Studies: DG Chest Port 1 View  Result Date: 03/21/2022 CLINICAL DATA:  Airspace opacities in the lung apices EXAM: PORTABLE CHEST 1 VIEW COMPARISON:  03/20/2022 FINDINGS: Improvement and near resolution of the biapical opacities. There is only faint residual interstitial accentuation in this region. Cardiac and mediastinal margins appear normal. Atherosclerotic calcification of the aortic arch. No blunting of the costophrenic angles. Mild degenerative spurring of both acromioclavicular joints. IMPRESSION: 1. Near complete resolution of the biapical opacities. There is only faint residual interstitial accentuation in this region. 2. Atherosclerotic calcification of the aortic arch. Electronically Signed   By: Van Clines M.D.   On: 03/21/2022 10:51   DG Chest Port 1 View  Result Date: 03/20/2022 CLINICAL DATA:  Hypoxia EXAM: PORTABLE CHEST 1 VIEW COMPARISON:  Chest x-ray dated March 18, 2021 FINDINGS: Increased opacity of the left upper lung and new right upper lung opacity. Cardiac and mediastinal contours are within normal limits. No evidence of pleural effusion or pneumothorax. IMPRESSION: Increased biapical opacities, concerning for worsening infection. Electronically Signed   By: Yetta Glassman M.D.   On: 03/20/2022 11:22   ECHOCARDIOGRAM  COMPLETE  Result Date: 03/18/2022    ECHOCARDIOGRAM REPORT   Patient Name:   CLARESSA HUGHLEY Date of Exam: 03/18/2022 Medical Rec #:  [id number]        Height:       64.0 in Accession #:    [id number]       Weight:  175.5 lb Date of Birth:  [birth year]        BSA:          1.851 m Patient Age:    14 years         BP:           95/68 mmHg Patient Gender: F                HR:           82 bpm. Exam Location:  [facility] Procedure: 2D Echo Indications:     Chest Pain R07.9  History:         Patient has prior history of Echocardiogram examinations, most                  recent 08/25/2021.  Sonographer:     Kathlen Brunswick RDCS Referring Phys:  [id number] Athena Masse Diagnosing Phys: Buford Dresser MD IMPRESSIONS  1. Left ventricular ejection fraction, by estimation, is 55 to 60%. The left ventricle has normal function. The left ventricle has no regional wall motion abnormalities. Left ventricular diastolic parameters were normal.  2. Right ventricular systolic function is normal. The right ventricular size is normal. Tricuspid regurgitation signal is inadequate for assessing PA pressure.  3. The mitral valve is normal in structure. Trivial mitral valve regurgitation. No evidence of mitral stenosis.  4. The aortic valve is tricuspid. Aortic valve regurgitation is not visualized. No aortic stenosis is present.  5. The inferior vena cava is normal in size with <50% respiratory variability, suggesting right atrial pressure of 8 mmHg. Comparison(s): No significant change from prior study. Conclusion(s)/Recommendation(s): Normal biventricular function without evidence of hemodynamically significant valvular heart disease. FINDINGS  Left Ventricle: Left ventricular ejection fraction, by estimation, is 55 to 60%. The left ventricle has normal function. The left ventricle has no regional wall motion abnormalities. The left ventricular internal cavity size was normal in size. There is  no left ventricular hypertrophy. Left  ventricular diastolic parameters were normal. Right Ventricle: The right ventricular size is normal. No increase in right ventricular wall thickness. Right ventricular systolic function is normal. Tricuspid regurgitation signal is inadequate for assessing PA pressure. Left Atrium: Left atrial size was normal in size. Right Atrium: Right atrial size was normal in size. Pericardium: There is no evidence of pericardial effusion. Mitral Valve: The mitral valve is normal in structure. Trivial mitral valve regurgitation. No evidence of mitral valve stenosis. Tricuspid Valve: The tricuspid valve is grossly normal. Tricuspid valve regurgitation is not demonstrated. No evidence of tricuspid stenosis. Aortic Valve: The aortic valve is tricuspid. Aortic valve regurgitation is not visualized. No aortic stenosis is present. Aortic valve peak gradient measures 8.5 mmHg. Pulmonic Valve: The pulmonic valve was grossly normal. Pulmonic valve regurgitation is not visualized. No evidence of pulmonic stenosis. Aorta: The aortic root and ascending aorta are structurally normal, with no evidence of dilitation. Venous: The inferior vena cava is normal in size with less than 50% respiratory variability, suggesting right atrial pressure of 8 mmHg. IAS/Shunts: The atrial septum is grossly normal.  LEFT VENTRICLE PLAX 2D LVIDd:         4.60 cm   Diastology LVIDs:         3.20 cm   LV e' medial:    7.29 cm/s LV PW:         0.90 cm   LV E/e' medial:  14.0 LV IVS:        1.00 cm   LV e'  lateral:   13.70 cm/s LVOT diam:     1.80 cm   LV E/e' lateral: 7.4 LV SV:         49 LV SV Index:   26 LVOT Area:     2.54 cm  RIGHT VENTRICLE RV Basal diam:  2.80 cm RV S prime:     12.20 cm/s TAPSE (M-mode): 2.0 cm LEFT ATRIUM             Index        RIGHT ATRIUM          Index LA diam:        3.20 cm 1.73 cm/m   RA Area:     7.96 cm LA Vol (A2C):   32.9 ml 17.78 ml/m  RA Volume:   13.50 ml 7.29 ml/m LA Vol (A4C):   15.5 ml 8.38 ml/m LA Biplane Vol:  24.7 ml 13.35 ml/m  AORTIC VALVE                 PULMONIC VALVE AV Area (Vmax): 1.80 cm     PV Vmax:       0.96 m/s AV Vmax:        146.00 cm/s  PV Peak grad:  3.7 mmHg AV Peak Grad:   8.5 mmHg LVOT Vmax:      103.00 cm/s LVOT Vmean:     66.400 cm/s LVOT VTI:       0.192 m  AORTA Ao Root diam: 3.00 cm MITRAL VALVE MV Area (PHT): 4.74 cm     SHUNTS MV Decel Time: 160 msec     Systemic VTI:  0.19 m MV E velocity: 102.00 cm/s  Systemic Diam: 1.80 cm MV A velocity: 84.90 cm/s MV E/A ratio:  1.20 Buford Dresser MD Electronically signed by Buford Dresser MD Signature Date/Time: 03/18/2022/3:00:17 PM    Final    DG Chest Port 1 View  Result Date: 03/18/2022 CLINICAL DATA:  Chest pain and heaviness EXAM: PORTABLE CHEST 1 VIEW COMPARISON:  03/16/2022 FINDINGS: Hazy density at the left apex not seen on recent prior. There could also be new subtle opacity in the right upper lung at the level of the posterior fifth rib. Interstitial coarsening that is generalized and stable. Normal heart size and mediastinal contours. No effusion or pneumothorax. IMPRESSION: New hazy density at the left apex suggesting pneumonia. Electronically Signed   By: Jorje Guild M.D.   On: 03/18/2022 05:20   DG Chest 2 View  Result Date: 03/16/2022 CLINICAL DATA:  Shortness of breath EXAM: CHEST - 2 VIEW COMPARISON:  CT chest dated March 10, 2018 FINDINGS: The heart size and mediastinal contours are within normal limits. Atherosclerotic calcification of the aortic arch. Hyperinflated lungs suggesting COPD without evidence of focal consolidation or pleural effusion. Thoracic spondylosis. No acute osseous abnormality. IMPRESSION: 1. No active cardiopulmonary disease. 2. Hyperinflated lungs suggesting COPD. Electronically Signed   By: Keane Police D.O.   On: 03/16/2022 22:18   CT CHEST W CONTRAST  Result Date: 03/13/2022 CLINICAL DATA:  History of pneumonia.  Smoking history EXAM: CT CHEST WITH CONTRAST TECHNIQUE:  Multidetector CT imaging of the chest was performed during intravenous contrast administration. RADIATION DOSE REDUCTION: This exam was performed according to the departmental dose-optimization program which includes automated exposure control, adjustment of the mA and/or kV according to patient size and/or use of iterative reconstruction technique. CONTRAST:  42m OMNIPAQUE IOHEXOL 300 MG/ML  SOLN COMPARISON:  CT chest 04/28/2021 FINDINGS: Cardiovascular: Normal heart size.  No pericardial effusion. Thoracic aorta is nonaneurysmal. Scattered atherosclerotic vascular calcifications of the aorta and coronary arteries. Central pulmonary vasculature is nondilated. Mediastinum/Nodes: No axillary, hilar, or mediastinal lymphadenopathy. Trachea and esophagus within normal limits. 1.7 cm left thyroid lobe nodule. Lungs/Pleura: Mild-to-moderate centrilobular emphysema. 7 mm part solid subpleural pulmonary nodule in the right lower lobe (series 3, image 74), previously measured 4 mm. No airspace consolidation. No pleural effusion or pneumothorax. Upper Abdomen: Stable benign bilateral adrenal adenomas which do not require imaging follow-up. No acute findings within the imaged upper abdomen. Musculoskeletal: No chest wall abnormality. No acute or significant osseous findings. IMPRESSION: 1. Right lower lobe 7 mm part solid subpleural pulmonary nodule, previously measured 4 mm. Adenocarcinoma is considered. Thoracic surgery consultation is recommended. These recommendations are taken from: Recommendations for the Management of Subsolid Pulmonary Nodules Detected at CT: A Statement from the [facility] 2013; 266:1, 304-317. 2. Lungs are otherwise clear.  No findings to suggest pneumonia. 3. 1.7 cm left thyroid lobe nodule. Recommend thyroid US (ref: J Am Coll Radiol. 2015 Feb;12(2): 143-50). 4. Aortic and coronary artery atherosclerosis (ICD10-I70.0). 5. Emphysema (ICD10-J43.9). Electronically Signed   By:  Davina Poke D.O.   On: 03/13/2022 09:49      Labs: BNP (last 3 results) Recent Labs    03/18/22 0428  BNP 78.6   Basic Metabolic Panel: Recent Labs  Lab 03/16/22 2152 03/19/22 1105 03/21/22 0610  NA 135 136 137  K 3.5 3.7 3.7  CL 100 104 100  CO2 '23 22 26  '$ GLUCOSE 153* 231* 127*  BUN 18 18 21*  CREATININE 0.88 0.70 0.78  CALCIUM 8.3* 7.9* 8.4*  MG  --   --  1.9   Liver Function Tests: Recent Labs  Lab 03/16/22 2152 03/19/22 1105  AST 24 31  ALT 22 33  ALKPHOS 56 56  BILITOT 0.4 0.5  PROT 6.8 6.3*  ALBUMIN 3.4* 3.1*   No results for input(s): "LIPASE", "AMYLASE" in the last 168 hours. No results for input(s): "AMMONIA" in the last 168 hours. CBC: Recent Labs  Lab 03/16/22 2152 03/21/22 0610  WBC 7.4 12.8*  NEUTROABS 5.6  --   HGB 14.8 13.1  HCT 45.3 40.1  MCV 88.5 88.5  PLT 180 293   Cardiac Enzymes: No results for input(s): "CKTOTAL", "CKMB", "CKMBINDEX", "TROPONINI" in the last 168 hours. BNP: Invalid input(s): "POCBNP" CBG: Recent Labs  Lab 03/20/22 1125 03/20/22 1655 03/20/22 2023 03/21/22 0853 03/21/22 1206  GLUCAP 279* 189* 266* 146* 234*   D-Dimer No results for input(s): "DDIMER" in the last 72 hours. Hgb A1c No results for input(s): "HGBA1C" in the last 72 hours. Lipid Profile No results for input(s): "CHOL", "HDL", "LDLCALC", "TRIG", "CHOLHDL", "LDLDIRECT" in the last 72 hours. Thyroid function studies No results for input(s): "TSH", "T4TOTAL", "T3FREE", "THYROIDAB" in the last 72 hours.  Invalid input(s): "FREET3" Anemia work up No results for input(s): "VITAMINB12", "FOLATE", "FERRITIN", "TIBC", "IRON", "RETICCTPCT" in the last 72 hours. Urinalysis No results found for: "COLORURINE", "APPEARANCEUR", "LABSPEC", "PHURINE", "GLUCOSEU", "HGBUR", "BILIRUBINUR", "KETONESUR", "PROTEINUR", "UROBILINOGEN", "NITRITE", "LEUKOCYTESUR" Sepsis Labs Recent Labs  Lab 03/16/22 2152 03/21/22 0610  WBC 7.4 12.8*    Microbiology Recent Results (from the past 240 hour(s))  Blood culture (routine x 2)     Status: None   Collection Time: 03/16/22  9:51 PM   Specimen: BLOOD  Result Value Ref Range Status   Specimen Description BLOOD RIGHT [facility]  Final   Special Requests   Final  BOTTLES DRAWN AEROBIC AND ANAEROBIC Blood Culture results may not be optimal due to an excessive volume of blood received in culture bottles   Culture   Final    NO GROWTH 5 DAYS Performed at [facility], [address] [place], [place] [address]    Report Status 03/21/2022 FINAL  Final  Blood culture (routine x 2)     Status: None   Collection Time: 03/16/22 10:45 PM   Specimen: BLOOD LEFT ARM  Result Value Ref Range Status   Specimen Description BLOOD LEFT ARM  Final   Special Requests   Final    BOTTLES DRAWN AEROBIC AND ANAEROBIC Blood Culture adequate volume   Culture   Final    NO GROWTH 5 DAYS Performed at [facility], [address] [place], [place] [address]    Report Status 03/21/2022 FINAL  Final  Resp panel by RT-PCR (RSV, Flu A&B, Covid) Anterior Nasal Swab     Status: Abnormal   Collection Time: 03/16/22 10:45 PM   Specimen: Anterior Nasal Swab  Result Value Ref Range Status   SARS Coronavirus 2 by RT PCR POSITIVE (A) NEGATIVE Final    Comment: (NOTE) SARS-CoV-2 target nucleic acids are DETECTED.  The SARS-CoV-2 RNA is generally detectable in upper respiratory specimens during the acute phase of infection. Positive results are indicative of the presence of the identified virus, but do not rule out bacterial infection or co-infection with other pathogens not detected by the test. Clinical correlation with patient history and other diagnostic information is necessary to determine patient infection status. The expected result is Negative.  Fact Sheet for Patients: EntrepreneurPulse.com.au  Fact Sheet for Healthcare  Providers: IncredibleEmployment.be  This test is not yet approved or cleared by the [place] FDA and  has been authorized for detection and/or diagnosis of SARS-CoV-2 by FDA under an Emergency Use Authorization (EUA).  This EUA will remain in effect (meaning this test can be used) for the duration of  the COVID-19 declaration under Section 564(b)(1) of the A ct, 21 U.S.C. section 360bbb-3(b)(1), unless the authorization is terminated or revoked sooner.     Influenza A by PCR NEGATIVE NEGATIVE Final   Influenza B by PCR NEGATIVE NEGATIVE Final    Comment: (NOTE) The Xpert Xpress SARS-CoV-2/FLU/RSV plus assay is intended as an aid in the diagnosis of influenza from Nasopharyngeal swab specimens and should not be used as a sole basis for treatment. Nasal washings and aspirates are unacceptable for Xpert Xpress SARS-CoV-2/FLU/RSV testing.  Fact Sheet for Patients: EntrepreneurPulse.com.au  Fact Sheet for Healthcare Providers: IncredibleEmployment.be  This test is not yet approved or cleared by the [place] FDA and has been authorized for detection and/or diagnosis of SARS-CoV-2 by FDA under an Emergency Use Authorization (EUA). This EUA will remain in effect (meaning this test can be used) for the duration of the COVID-19 declaration under Section 564(b)(1) of the Act, 21 U.S.C. section 360bbb-3(b)(1), unless the authorization is terminated or revoked.     Resp Syncytial Virus by PCR NEGATIVE NEGATIVE Final    Comment: (NOTE) Fact Sheet for Patients: EntrepreneurPulse.com.au  Fact Sheet for Healthcare Providers: IncredibleEmployment.be  This test is not yet approved or cleared by the [place] FDA and has been authorized for detection and/or diagnosis of SARS-CoV-2 by FDA under an Emergency Use Authorization (EUA). This EUA will remain in effect (meaning this test can be  used) for the duration of the COVID-19 declaration under Section 564(b)(1) of the Act, 21 U.S.C.  section 360bbb-3(b)(1), unless the authorization is terminated or revoked.  Performed at [facility], [address] [place], [place] [address]      Total time spend on discharging this patient, including the last patient exam, discussing the hospital stay, instructions for ongoing care as it relates to all pertinent caregivers, as well as preparing the medical discharge records, prescriptions, and/or referrals as applicable, is 40 minutes.    Enzo Bi, MD  [facility] 03/21/2022, 2:13 PM

## 2022-03-21 NOTE — Inpatient Diabetes Management (Addendum)
Inpatient Diabetes Program Recommendations  AACE/ADA: New Consensus Statement on Inpatient Glycemic Control (2015)  Target Ranges:  Prepandial:   less than 140 mg/dL      Peak postprandial:   less than 180 mg/dL (1-2 hours)      Critically ill patients:  140 - 180 mg/dL    Latest Reference Range & Units 03/20/22 07:57 03/20/22 11:25 03/20/22 16:55 03/20/22 20:23 03/21/22 08:53  Glucose-Capillary 70 - 99 mg/dL 114 (H) 279 (H) 189 (H) 266 (H) 146 (H)   Review of Glycemic Control  Diabetes history: DM2 Outpatient Diabetes medications: Glumetza 500 mg daily Current orders for Inpatient glycemic control: Novolog 0-20 units TID with meals, Novolog 0-5 units QHS, Metformin XR 1000 mg BID; Prednisone 30 mg daily (tapering)  Inpatient Diabetes Program Recommendations:    Insulin: Noted steroids are being tapered. If steroids are continued, please consider ordering Novolog 3 units TID with meals for meal coverage if patient eats at least 50% of meals.  Thanks, Barnie Alderman, RN, MSN, [place] Diabetes Coordinator Inpatient Diabetes Program [phone] (Team Pager from 8am to [place])

## 2022-03-21 NOTE — Care Management Important Message (Signed)
Important Message  Patient Details  Name: RAMANDEEP ARINGTON MRN: [id number] Date of Birth: 1960-09-07   Medicare Important Message Given:  Yes  Reviewed Medicare IM with patient via room phone [phone]).  Copy of Medicare IM sent securely to email address on file: [email or web address].    Dannette Barbara 03/21/2022, 1:08 PM

## 2022-03-21 NOTE — Progress Notes (Signed)
PULMONOLOGY         Date: 03/21/2022,   MRN# [id number] Laurie Berg [birth year]     AdmissionWeight: 79.6 kg                 CurrentWeight: 79.6 kg  Referring provider: Dr. Lydia Guiles   CHIEF COMPLAINT:   Acute on chronic hypoxemic respiratory failure   HISTORY OF PRESENT ILLNESS   This is a very pleasant 62 year old female known to me from outpatient pulmonology clinic who was lifelong smoker with history of COPD and chronic hypoxemia.  He also has a background history of chronic pain syndrome, occult positive stools, colonic polyps, cervical radiculopathy history of sepsis and COVID-19.  Patient still currently smokes with minimal improvement post inhaler therapy outpatient.  Recent treatment with antimicrobials and steroids for acute exacerbation of COPD.  Patient came in with hypoxemia requiring 4 L supplemental O2 to reach 90% SpO2.  Also noted to have febrile flulike illness.  PCR for COVID came back positive.  PCCM consultation for additional evaluation management.  Reviewed previous CT chest from March 10, 2022 with findings of centrilobular emphysema mild congestion bilaterally and bronchitic COPD phenotype.   03/18/22- patient is improved today.  She did desaturate on ambulation despite having 3L/min [place]. She is less ronchorous today.  CRP is moderately elevated today.    03/19/22- patient reports forceful painful cough overnight with nighttime disturbances.  She feels tired today. She had cough syrup with codiene.  She's on paxlovid, there is no cmp today to monitor renal and hepatic function.  I have ordered this.  She's eating well.  Have reduced prednisone dose.  She said she's not ready to leave yet.   03/20/22- Patient being optimized for DC.  She is s/p 3.5L net diuresis overnight. Sugars have improved nicely.  Repeat CXR in progress.  03/21/22- patient is cleared for dc home.  CXR performed today and reviewed by me with reports as below with substantial  improvement.  During my evaluation patient was speaking in full sentences on room air with non labored breathing.  Vitals show >95%sPO2 on room air.  Patient may follow up with me on outpatient basis with [facility] pulmonary.  CLINICAL DATA:  Airspace opacities in the lung apices   EXAM: PORTABLE CHEST 1 VIEW   COMPARISON:  03/20/2022   FINDINGS: Improvement and near resolution of the biapical opacities. There is only faint residual interstitial accentuation in this region.   Cardiac and mediastinal margins appear normal. Atherosclerotic calcification of the aortic arch. No blunting of the costophrenic angles.   Mild degenerative spurring of both acromioclavicular joints.   IMPRESSION: 1. Near complete resolution of the biapical opacities. There is only faint residual interstitial accentuation in this region. 2. Atherosclerotic calcification of the aortic arch.     Electronically Signed   By: Van Clines M.D.   On: 03/21/2022 10:51        PAST MEDICAL HISTORY   Past Medical History:  Diagnosis Date   Cervical radiculopathy    Chronic pain syndrome    Colon polyps    COPD (chronic obstructive pulmonary disease) (HCC)    Coronary artery calcification seen on CT scan    a. 04/2021 High Res CT Chest: Ao atherosclerosis, cor Ca2+; b. 07/2021 MV: EF 69%, no isch/infarct, LAD/LCX Ca2+ noted; c. 02/2022 CT Chest: Cor and Ao atherosclerosis.   COVID-19 virus infection    Diabetes mellitus without complication ([place])    History of echocardiogram  a. 08/2021 Echo: EF 55-60%, no rwma, nl RV fxn.   Lung nodule    a. 02/2022 CT Chest: RLL 16m solid subpleural pulm nodule, prev 446m->rec thoracic surgery eval.   [place]   Tobacco abuse      SURGICAL HISTORY   Past Surgical History:  Procedure Laterality Date   CARPAL TUNNEL RELEASE     COLONOSCOPY WITH PROPOFOL N/A 10/21/2019   Procedure: COLONOSCOPY WITH PROPOFOL;  Surgeon: TaVirgel ManifoldMD;  Location: [facility]  ENDOSCOPY;  Service: Endoscopy;  Laterality: N/A;   DILATION AND CURETTAGE OF UTERUS     TUBAL LIGATION       FAMILY HISTORY   History reviewed. No pertinent family history.   SOCIAL HISTORY   Social History   Tobacco Use   Smoking status: Every Day    Packs/day: 1.75    Years: 48.00    Total pack years: 84.00    Types: Cigarettes   Smokeless tobacco: Never  Vaping Use   Vaping Use: Never used  Substance Use Topics   Alcohol use: Not Currently   Drug use: Never     MEDICATIONS    Home Medication:     Current Medication:  Current Facility-Administered Medications:    acetaminophen (TYLENOL) tablet 650 mg, 650 mg, Oral, Q6H PRN **OR** acetaminophen (TYLENOL) suppository 650 mg, 650 mg, Rectal, Q6H PRN, DuAthena MasseMD   acetylcysteine (MUCOMYST) 20 % nebulizer / oral solution 4 mL, 4 mL, Nebulization, BID, LaEnzo BiMD   albuterol (VENTOLIN HFA) 108 (90 Base) MCG/ACT inhaler 2 puff, 2 puff, Inhalation, Q6H, DuAthena MasseMD, 2 puff at 03/20/22 2054   ascorbic acid (VITAMIN C) tablet 500 mg, 500 mg, Oral, Daily, AlLanney GinsFuad, MD, 500 mg at 03/21/22 [id number] aspirin chewable tablet 81 mg, 81 mg, Oral, Daily, BeTheora GianottiNP, 81 mg at 03/21/22 0916   azithromycin (ZITHROMAX) tablet 250 mg, 250 mg, Oral, Daily, AlOttie GlazierMD, 250 mg at 03/21/22 [id number] chlorpheniramine-HYDROcodone (TUSSIONEX) 10-8 MG/5ML suspension 5 mL, 5 mL, Oral, Q12H PRN, DuAthena MasseMD, 5 mL at 03/21/22 0502   cyclobenzaprine (FLEXERIL) tablet 10 mg, 10 mg, Oral, BID PRN, PaFritzi MandesMD, 10 mg at 03/18/22 2242   DULoxetine (CYMBALTA) DR capsule 90 mg, 90 mg, Oral, Daily, PaFritzi MandesMD, 90 mg at 03/21/22 0916   enoxaparin (LOVENOX) injection 40 mg, 40 mg, Subcutaneous, QHS, DuAthena MasseMD, 40 mg at 03/20/22 2053   feeding supplement (GLUCERNA SHAKE) (GLUCERNA SHAKE) liquid 237 mL, 237 mL, Oral, TID BM, PaFritzi MandesMD, 237 mL at 03/21/22 0925   guaiFENesin  (MUCINEX) 12 hr tablet 600 mg, 600 mg, Oral, BID, DuJudd Gaudier, MD, 600 mg at 03/20/22 1218   guaiFENesin-dextromethorphan (ROBITUSSIN DM) 100-10 MG/5ML syrup 10 mL, 10 mL, Oral, Q4H PRN, DuAthena MasseMD, 10 mL at 03/19/22 1557   insulin aspart (novoLOG) injection 0-20 Units, 0-20 Units, Subcutaneous, TID WC, LaEnzo BiMD, 3 Units at 03/21/22 0914   insulin aspart (novoLOG) injection 0-5 Units, 0-5 Units, Subcutaneous, QHS, PaFritzi MandesMD, 3 Units at 03/20/22 2053   ipratropium-albuterol (DUONEB) 0.5-2.5 (3) MG/3ML nebulizer solution 3 mL, 3 mL, Nebulization, Q4H PRN, PaFritzi MandesMD   ipratropium-albuterol (DUONEB) 0.5-2.5 (3) MG/3ML nebulizer solution 3 mL, 3 mL, Nebulization, BID, LaEnzo BiMD   metFORMIN (GLUCOPHAGE-XR) 24 hr tablet 1,000 mg, 1,000 mg, Oral, BID WC, LaEnzo BiMD, 1,000 mg at 03/21/22 [id number]  multivitamin with minerals tablet 1 tablet, 1 tablet, Oral, Daily, [name], MD, 1 tablet at 03/21/22 1610   nicotine (NICODERM CQ - dosed in mg/24 hours) patch 21 mg, 21 mg, Transdermal, Daily, Athena Masse, MD, 21 mg at 03/21/22 0920   nirmatrelvir/ritonavir (PAXLOVID) 3 tablet, 3 tablet, Oral, BID, [name], MD, 3 tablet at 03/21/22 9604   nitroGLYCERIN (NITROSTAT) SL tablet 0.4 mg, 0.4 mg, Sublingual, Q5 min PRN, [name], MD, 0.4 mg at 03/18/22 0451   ondansetron (ZOFRAN) tablet 4 mg, 4 mg, Oral, Q6H PRN **OR** ondansetron (ZOFRAN) injection 4 mg, 4 mg, Intravenous, Q6H PRN, Athena Masse, MD   oxyCODONE (Oxy IR/ROXICODONE) immediate release tablet 5 mg, 5 mg, Oral, Q4H PRN, Athena Masse, MD, 5 mg at 03/21/22 0502   [COMPLETED] predniSONE (DELTASONE) tablet 35 mg, 35 mg, Oral, Q breakfast, 35 mg at 03/21/22 0917 **FOLLOWED BY** [START ON 03/22/2022] predniSONE (DELTASONE) tablet 30 mg, 30 mg, Oral, Q breakfast **FOLLOWED BY** [START ON 03/23/2022] predniSONE (DELTASONE) tablet 25 mg, 25 mg, Oral, Q breakfast **FOLLOWED BY** [START ON 03/24/2022] predniSONE  (DELTASONE) tablet 20 mg, 20 mg, Oral, Q breakfast **FOLLOWED BY** [START ON 03/25/2022] predniSONE (DELTASONE) tablet 15 mg, 15 mg, Oral, Q breakfast **FOLLOWED BY** [START ON 03/26/2022] predniSONE (DELTASONE) tablet 10 mg, 10 mg, Oral, Q breakfast **FOLLOWED BY** [START ON 03/27/2022] predniSONE (DELTASONE) tablet 5 mg, 5 mg, Oral, Q breakfast, Benita Gutter, RPH   zinc sulfate capsule 220 mg, 220 mg, Oral, Daily, Ottie Glazier, MD, 220 mg at 03/21/22 5409    ALLERGIES   Pregabalin and Sulfamethoxazole-trimethoprim     REVIEW OF SYSTEMS    Review of Systems:  Gen:  Denies  fever, sweats, chills weigh loss  HEENT: Denies blurred vision, double vision, ear pain, eye pain, hearing loss, nose bleeds, sore throat Cardiac:  No dizziness, chest pain or heaviness, chest tightness,edema Resp:   reports dyspnea chronically  Gi: Denies swallowing difficulty, stomach pain, nausea or vomiting, diarrhea, constipation, bowel incontinence Gu:  Denies bladder incontinence, burning urine Ext:   Denies Joint pain, stiffness or swelling Skin: Denies  skin rash, easy bruising or bleeding or hives Endoc:  Denies polyuria, polydipsia , polyphagia or weight change Psych:   Denies depression, insomnia or hallucinations   Other:  All other systems negative   VS: BP 131/82 (BP Location: Left Arm)   Pulse 77   Temp 98.7 F (37.1 C) (Oral)   Resp 18   Ht '5\' 4"'$  (1.626 m)   Wt 79.6 kg   SpO2 96%   BMI 30.12 kg/m      PHYSICAL EXAM    GENERAL:NAD, no fevers, chills, no weakness no fatigue HEAD: Normocephalic, atraumatic.  EYES: Pupils equal, round, reactive to light. Extraocular muscles intact. No scleral icterus.  MOUTH: Moist mucosal membrane. Dentition intact. No abscess noted.  EAR, NOSE, THROAT: Clear without exudates. No external lesions.  NECK: Supple. No thyromegaly. No nodules. No JVD.  PULMONARY: decreased breath sounds with mild rhonchi worse at bases bilaterally.   CARDIOVASCULAR: S1 and S2. Regular rate and rhythm. No murmurs, rubs, or gallops. No edema. Pedal pulses 2+ bilaterally.  GASTROINTESTINAL: Soft, nontender, nondistended. No masses. Positive bowel sounds. No hepatosplenomegaly.  MUSCULOSKELETAL: No swelling, clubbing, or edema. Range of motion full in all extremities.  NEUROLOGIC: Cranial nerves II through XII are intact. No gross focal neurological deficits. Sensation intact. Reflexes intact.  SKIN: No ulceration, lesions, rashes, or cyanosis. Skin warm and dry. Turgor  intact.  PSYCHIATRIC: Mood, affect within normal limits. The patient is awake, alert and oriented x 3. Insight, judgment intact.       IMAGING            ASSESSMENT/PLAN      Acute COVID19 viral LRTI with COPD exacerbation -Remdesevir antiviral - has been dcd -vitamin C -zinc -switching solumedrol to prednisone '35mg'$  prednisone -Diuresis - Lasix 20 IV daily - monitor UOP - utilize external urinary catheter if possible -encourage to use IS and Acapella device for bronchopulmonary hygiene when able -consider  -CRP has imrpoved from 10 to 5  -supportive care with ICU telemetry monitoring -PT/OT when possible -procalcitonin, CRP and ferritin trending -agree with nebulizer therapy routine q6h     Thank you for allowing me to participate in the care of this patient.   Patient/Family are satisfied with care plan and all questions have been answered.    Provider disclosure: Patient with at least one acute or chronic illness or injury that poses a threat to life or bodily function and is being managed actively during this encounter.  All of the below services have been performed independently by signing provider:  review of prior documentation from internal and or external health records.  Review of previous and current lab results.  Interview and comprehensive assessment during patient visit today. Review of current and previous chest radiographs/CT scans.  Discussion of management and test interpretation with health care team and patient/family.   This document was prepared using Dragon voice recognition software and may include unintentional dictation errors.     Ottie Glazier, M.D.  Division of Pulmonary & Critical Care Medicine

## 2022-03-21 NOTE — TOC Transition Note (Signed)
Transition of Care [facility]) - CM/SW Discharge Note   Patient Details  Name: Laurie Berg MRN: [id number] Date of Birth: 1960-07-21  [facility] CM/SW Contact:  Beverly Sessions, RN Phone Number: 03/21/2022, 1:57 PM   Clinical Narrative:     Notified by RN and MD that patient now with qualifying sats and order for O2.  Delivered to patients room by [facility] with adapt     Barriers to Discharge: Continued Medical Work up   Patient Goals and CMS Choice CMS Medicare.gov Compare Post Acute Care list provided to:: Patient Choice offered to / list presented to : Patient  Discharge Placement                         Discharge Plan and Services Additional resources added to the After Visit Summary for                                       Social Determinants of Health (SDOH) Interventions SDOH Screenings   Tobacco Use: High Risk (03/18/2022)     Readmission Risk Interventions     No data to display

## 2022-03-21 NOTE — TOC Transition Note (Signed)
Transition of Care [facility]) - CM/SW Discharge Note   Patient Details  Name: KHADIJATOU BORAK MRN: [id number] Date of Birth: Sep 24, 1960  [facility] CM/SW Contact:  Beverly Sessions, RN Phone Number: 03/21/2022, 11:41 AM   Clinical Narrative:    Patient to discharge today [place] with [place] notified of discharge.  Per MD patient did not qualify for home O2 on 2/3 and will not require O2 at discharge. She has notified the bedside RN to remove nasal cannula   TOC signing off     Barriers to Discharge: Continued Medical Work up   Patient Goals and CMS Choice CMS Medicare.gov Compare Post Acute Care list provided to:: Patient Choice offered to / list presented to : Patient  Discharge Placement                         Discharge Plan and Services Additional resources added to the After Visit Summary for                                       Social Determinants of Health (SDOH) Interventions SDOH Screenings   Tobacco Use: High Risk (03/18/2022)     Readmission Risk Interventions     No data to display

## 2022-03-21 NOTE — Progress Notes (Signed)
Mobility Specialist - Progress Note   03/21/22 1149  Mobility  Activity Ambulated independently in hallway  Level of Assistance Independent  Assistive Device None  Distance Ambulated (ft) 160 ft  Activity Response Tolerated well  $Mobility charge 1 Mobility   Nurse requested Mobility Specialist to perform oxygen saturation test with pt which includes removing pt from oxygen both at rest and while ambulating.  Below are the results from that testing.     Patient Saturations on Room Air at Rest = spO2 88%  Patient Saturations on Room Air while Ambulating = sp02 86% . Rested and performed pursed lip breathing for 1 minute with sp02 at 87%.  Patient Saturations on 1L of oxygen while Ambulating = sp02 90%  At end of testing pt left in room on 1L of oxygen.  Reported results to nurse.   Pt amb in room upon entry, utilizing RA with O2 at 88%. Pt denied any symptoms such as SOB, dizziness or fatigue. Pt amb approximately 20 ft in the hallway before O2 desat to 86%. Pt stopped for a standing rest break in attempt to raise O2, one min of pursed lip breathing unsuccessful. MS placed Pt on 1L [place], after 20-25 seconds O2 rose to 90%. Pt returned to room, left sitting EOB with needs within reach.   Candie Mile Mobility Specialist 03/21/22 11:56 AM

## 2022-03-24 ENCOUNTER — Ambulatory Visit: Payer: Medicare HMO | Admitting: Cardiology

## 2022-03-29 ENCOUNTER — Other Ambulatory Visit: Payer: Self-pay

## 2022-03-29 MED ORDER — ATORVASTATIN CALCIUM 20 MG PO TABS: 20.0000 mg | ORAL_TABLET | Freq: Every day | ORAL | 1 refills | Status: DC

## 2022-04-04 ENCOUNTER — Ambulatory Visit: Payer: Medicare HMO | Admitting: Cardiology

## 2022-04-26 ENCOUNTER — Other Ambulatory Visit: Payer: Self-pay

## 2022-04-26 MED ORDER — ATORVASTATIN CALCIUM 20 MG PO TABS: 20.0000 mg | ORAL_TABLET | Freq: Every day | ORAL | 3 refills | Status: DC

## 2022-05-02 ENCOUNTER — Ambulatory Visit: Payer: Medicare HMO

## 2022-05-03 ENCOUNTER — Ambulatory Visit: Payer: Medicare HMO | Admitting: Cardiology

## 2022-05-10 ENCOUNTER — Ambulatory Visit: Payer: Medicare HMO | Admitting: Cardiology

## 2022-06-16 ENCOUNTER — Ambulatory Visit: Payer: Medicare HMO | Admitting: Cardiology

## 2022-08-16 ENCOUNTER — Other Ambulatory Visit: Payer: Self-pay | Admitting: Family Medicine

## 2022-08-16 DIAGNOSIS — Z1231 Encounter for screening mammogram for malignant neoplasm of breast: Secondary | ICD-10-CM

## 2022-10-07 LAB — COLOGUARD

## 2022-10-27 LAB — COLOGUARD: COLOGUARD: NEGATIVE

## 2023-04-23 ENCOUNTER — Inpatient Hospital Stay
Admission: EM | Admit: 2023-04-23 | Discharge: 2023-04-27 | DRG: 189 | Disposition: A | Discharge status: Home / Self Care | Attending: Internal Medicine | Admitting: Internal Medicine

## 2023-04-23 ENCOUNTER — Emergency Department

## 2023-04-23 ENCOUNTER — Other Ambulatory Visit: Payer: Self-pay

## 2023-04-23 DIAGNOSIS — J189 Pneumonia, unspecified organism: Secondary | ICD-10-CM | POA: Diagnosis not present

## 2023-04-23 DIAGNOSIS — Z7984 Long term (current) use of oral hypoglycemic drugs: Secondary | ICD-10-CM

## 2023-04-23 DIAGNOSIS — Z79899 Other long term (current) drug therapy: Secondary | ICD-10-CM | POA: Diagnosis not present

## 2023-04-23 DIAGNOSIS — Z1152 Encounter for screening for COVID-19: Secondary | ICD-10-CM | POA: Diagnosis not present

## 2023-04-23 DIAGNOSIS — Z66 Do not resuscitate: Secondary | ICD-10-CM | POA: Diagnosis present

## 2023-04-23 DIAGNOSIS — Z79891 Long term (current) use of opiate analgesic: Secondary | ICD-10-CM | POA: Diagnosis not present

## 2023-04-23 DIAGNOSIS — K219 Gastro-esophageal reflux disease without esophagitis: Secondary | ICD-10-CM | POA: Diagnosis present

## 2023-04-23 DIAGNOSIS — Z8616 Personal history of COVID-19: Secondary | ICD-10-CM | POA: Diagnosis not present

## 2023-04-23 DIAGNOSIS — E119 Type 2 diabetes mellitus without complications: Secondary | ICD-10-CM | POA: Diagnosis present

## 2023-04-23 DIAGNOSIS — G4700 Insomnia, unspecified: Secondary | ICD-10-CM | POA: Diagnosis present

## 2023-04-23 DIAGNOSIS — I2510 Atherosclerotic heart disease of native coronary artery without angina pectoris: Secondary | ICD-10-CM | POA: Diagnosis present

## 2023-04-23 DIAGNOSIS — Z9981 Dependence on supplemental oxygen: Secondary | ICD-10-CM | POA: Diagnosis not present

## 2023-04-23 DIAGNOSIS — J449 Chronic obstructive pulmonary disease, unspecified: Secondary | ICD-10-CM

## 2023-04-23 DIAGNOSIS — A419 Sepsis, unspecified organism: Secondary | ICD-10-CM | POA: Diagnosis not present

## 2023-04-23 DIAGNOSIS — J309 Allergic rhinitis, unspecified: Secondary | ICD-10-CM | POA: Diagnosis present

## 2023-04-23 DIAGNOSIS — E785 Hyperlipidemia, unspecified: Secondary | ICD-10-CM

## 2023-04-23 DIAGNOSIS — Z7982 Long term (current) use of aspirin: Secondary | ICD-10-CM

## 2023-04-23 DIAGNOSIS — G894 Chronic pain syndrome: Secondary | ICD-10-CM | POA: Diagnosis present

## 2023-04-23 DIAGNOSIS — Z7951 Long term (current) use of inhaled steroids: Secondary | ICD-10-CM | POA: Diagnosis not present

## 2023-04-23 DIAGNOSIS — J441 Chronic obstructive pulmonary disease with (acute) exacerbation: Secondary | ICD-10-CM | POA: Diagnosis present

## 2023-04-23 DIAGNOSIS — K589 Irritable bowel syndrome without diarrhea: Secondary | ICD-10-CM | POA: Insufficient documentation

## 2023-04-23 DIAGNOSIS — Z7952 Long term (current) use of systemic steroids: Secondary | ICD-10-CM

## 2023-04-23 DIAGNOSIS — R0602 Shortness of breath: Secondary | ICD-10-CM | POA: Diagnosis present

## 2023-04-23 DIAGNOSIS — Z888 Allergy status to other drugs, medicaments and biological substances status: Secondary | ICD-10-CM

## 2023-04-23 DIAGNOSIS — J9811 Atelectasis: Secondary | ICD-10-CM | POA: Diagnosis present

## 2023-04-23 DIAGNOSIS — J9621 Acute and chronic respiratory failure with hypoxia: Principal | ICD-10-CM | POA: Diagnosis present

## 2023-04-23 DIAGNOSIS — F17210 Nicotine dependence, cigarettes, uncomplicated: Secondary | ICD-10-CM | POA: Diagnosis present

## 2023-04-23 DIAGNOSIS — Z91198 Patient's noncompliance with other medical treatment and regimen for other reason: Secondary | ICD-10-CM

## 2023-04-23 DIAGNOSIS — Z882 Allergy status to sulfonamides status: Secondary | ICD-10-CM

## 2023-04-23 LAB — CBC WITH DIFFERENTIAL/PLATELET
Abs Immature Granulocytes: 0.04 10*3/uL (ref 0.00–0.07)
Basophils Absolute: 0.1 10*3/uL (ref 0.0–0.1)
Basophils Relative: 1 %
Eosinophils Absolute: 0 10*3/uL (ref 0.0–0.5)
Eosinophils Relative: 0 %
HCT: 43.2 % (ref 36.0–46.0)
Hemoglobin: 14 g/dL (ref 12.0–15.0)
Immature Granulocytes: 0 %
Lymphocytes Relative: 6 %
Lymphs Abs: 0.7 10*3/uL (ref 0.7–4.0)
MCH: 29.6 pg (ref 26.0–34.0)
MCHC: 32.4 g/dL (ref 30.0–36.0)
MCV: 91.3 fL (ref 80.0–100.0)
Monocytes Absolute: 0.5 10*3/uL (ref 0.1–1.0)
Monocytes Relative: 5 %
Neutro Abs: 9.1 10*3/uL — ABNORMAL HIGH (ref 1.7–7.7)
Neutrophils Relative %: 88 %
Platelets: 236 10*3/uL (ref 150–400)
RBC: 4.73 MIL/uL (ref 3.87–5.11)
RDW: 16.1 % — ABNORMAL HIGH (ref 11.5–15.5)
WBC: 10.4 10*3/uL (ref 4.0–10.5)
nRBC: 0 % (ref 0.0–0.2)

## 2023-04-23 LAB — COMPREHENSIVE METABOLIC PANEL
ALT: 15 U/L (ref 0–44)
AST: 23 U/L (ref 15–41)
Albumin: 3.6 g/dL (ref 3.5–5.0)
Alkaline Phosphatase: 61 U/L (ref 38–126)
Anion gap: 11 (ref 5–15)
BUN: 13 mg/dL (ref 6–20)
CO2: 23 mmol/L (ref 22–32)
Calcium: 8.6 mg/dL — ABNORMAL LOW (ref 8.9–10.3)
Chloride: 101 mmol/L (ref 98–111)
Creatinine, Ser: 0.76 mg/dL (ref 0.44–1.00)
GFR, Estimated: >60 mL/min (ref >60)
Glucose, Bld: 129 mg/dL — ABNORMAL HIGH (ref 70–99)
Potassium: 4 mmol/L (ref 3.5–5.1)
Sodium: 135 mmol/L (ref 135–145)
Total Bilirubin: 0.6 mg/dL (ref 0.0–1.2)
Total Protein: 6.9 g/dL (ref 6.5–8.1)

## 2023-04-23 LAB — RESP PANEL BY RT-PCR (RSV, FLU A&B, COVID)  RVPGX2
Influenza A by PCR: NEGATIVE
Influenza B by PCR: NEGATIVE
Resp Syncytial Virus by PCR: NEGATIVE
SARS Coronavirus 2 by RT PCR: NEGATIVE

## 2023-04-23 LAB — URINALYSIS, W/ REFLEX TO CULTURE (INFECTION SUSPECTED)
Bacteria, UA: NONE SEEN
Bilirubin Urine: NEGATIVE
Glucose, UA: NEGATIVE mg/dL
Ketones, ur: NEGATIVE mg/dL
Leukocytes,Ua: NEGATIVE
Nitrite: NEGATIVE
Protein, ur: 100 mg/dL — AB
Specific Gravity, Urine: 1.016 (ref 1.005–1.030)
pH: 5 (ref 5.0–8.0)

## 2023-04-23 LAB — PROTIME-INR
INR: 1 (ref 0.8–1.2)
Prothrombin Time: 13.9 s (ref 11.4–15.2)

## 2023-04-23 LAB — LACTIC ACID, PLASMA
Lactic Acid, Venous: 2.3 mmol/L (ref 0.5–1.9)
Lactic Acid, Venous: 1.6 mmol/L (ref 0.5–1.9)

## 2023-04-23 LAB — APTT: aPTT: 30 s (ref 24–36)

## 2023-04-23 MED ORDER — LACTATED RINGERS IV SOLN
150.0000 mL/h | INTRAVENOUS | Status: DC
  Administered 2023-04-24 (×2): 150 mL/h via INTRAVENOUS

## 2023-04-23 MED ORDER — METRONIDAZOLE 500 MG/100ML IV SOLN
500.0000 mg | Freq: Once | INTRAVENOUS | Status: AC
  Administered 2023-04-23: 500 mg via INTRAVENOUS
  Filled 2023-04-23: qty 100

## 2023-04-23 MED ORDER — NICOTINE 21 MG/24HR TD PT24
21.0000 mg | MEDICATED_PATCH | Freq: Every day | TRANSDERMAL | Status: DC
  Administered 2023-04-24 – 2023-04-27 (×4): 21 mg via TRANSDERMAL
  Filled 2023-04-23 (×4): qty 1

## 2023-04-23 MED ORDER — ATORVASTATIN CALCIUM 20 MG PO TABS
20.0000 mg | ORAL_TABLET | Freq: Every day | ORAL | Status: DC
Start: 2023-04-24 — End: 2023-04-27
  Administered 2023-04-24 – 2023-04-27 (×3): 20 mg via ORAL
  Filled 2023-04-23 (×4): qty 1

## 2023-04-23 MED ORDER — ONDANSETRON HCL 4 MG/2ML IJ SOLN
4.0000 mg | Freq: Four times a day (QID) | INTRAMUSCULAR | Status: DC | PRN
Start: 2023-04-23 — End: 2023-04-27

## 2023-04-23 MED ORDER — ASPIRIN 81 MG PO CHEW
81.0000 mg | CHEWABLE_TABLET | Freq: Every day | ORAL | Status: DC
  Administered 2023-04-24 – 2023-04-27 (×4): 81 mg via ORAL
  Filled 2023-04-23 (×4): qty 1

## 2023-04-23 MED ORDER — LACTATED RINGERS IV BOLUS (SEPSIS)
1000.0000 mL | Freq: Once | INTRAVENOUS | Status: AC
  Administered 2023-04-23: 1000 mL via INTRAVENOUS

## 2023-04-23 MED ORDER — TRAZODONE HCL 50 MG PO TABS: 25.0000 mg | ORAL_TABLET | Freq: Every evening | ORAL | Status: DC | PRN

## 2023-04-23 MED ORDER — SODIUM CHLORIDE 0.9 % IV SOLN
2.0000 g | INTRAVENOUS | Status: DC
  Administered 2023-04-24: 2 g via INTRAVENOUS
  Filled 2023-04-23: qty 20

## 2023-04-23 MED ORDER — ENOXAPARIN SODIUM 40 MG/0.4ML IJ SOSY
40.0000 mg | PREFILLED_SYRINGE | INTRAMUSCULAR | Status: DC
  Administered 2023-04-24 – 2023-04-27 (×4): 40 mg via SUBCUTANEOUS
  Filled 2023-04-23 (×4): qty 0.4

## 2023-04-23 MED ORDER — OXYCODONE-ACETAMINOPHEN 5-325 MG PO TABS
1.0000 | ORAL_TABLET | Freq: Four times a day (QID) | ORAL | Status: DC | PRN
  Administered 2023-04-24 (×2): 1 via ORAL
  Filled 2023-04-23 (×2): qty 1

## 2023-04-23 MED ORDER — ACETAMINOPHEN 650 MG RE SUPP: 650.0000 mg | Freq: Four times a day (QID) | RECTAL | Status: DC | PRN

## 2023-04-23 MED ORDER — SODIUM CHLORIDE 0.9 % IV SOLN
2.0000 g | Freq: Once | INTRAVENOUS | Status: AC
  Administered 2023-04-23: 2 g via INTRAVENOUS
  Filled 2023-04-23: qty 12.5

## 2023-04-23 MED ORDER — ADULT MULTIVITAMIN W/MINERALS CH
1.0000 | ORAL_TABLET | Freq: Every day | ORAL | Status: DC
  Administered 2023-04-24 – 2023-04-27 (×4): 1 via ORAL
  Filled 2023-04-23 (×4): qty 1

## 2023-04-23 MED ORDER — VANCOMYCIN HCL IN DEXTROSE 1-5 GM/200ML-% IV SOLN
1000.0000 mg | Freq: Once | INTRAVENOUS | Status: AC
  Administered 2023-04-23: 1000 mg via INTRAVENOUS
  Filled 2023-04-23: qty 200

## 2023-04-23 MED ORDER — IPRATROPIUM-ALBUTEROL 0.5-2.5 (3) MG/3ML IN SOLN
3.0000 mL | Freq: Four times a day (QID) | RESPIRATORY_TRACT | Status: DC
  Administered 2023-04-24 – 2023-04-27 (×12): 3 mL via RESPIRATORY_TRACT
  Filled 2023-04-23 (×13): qty 3

## 2023-04-23 MED ORDER — HYDROCOD POLI-CHLORPHE POLI ER 10-8 MG/5ML PO SUER: 5.0000 mL | Freq: Two times a day (BID) | ORAL | Status: DC | PRN

## 2023-04-23 MED ORDER — OXYCODONE HCL ER 10 MG PO T12A
30.0000 mg | EXTENDED_RELEASE_TABLET | Freq: Two times a day (BID) | ORAL | Status: DC
  Administered 2023-04-24 – 2023-04-27 (×7): 30 mg via ORAL
  Filled 2023-04-23 (×7): qty 3

## 2023-04-23 MED ORDER — SODIUM CHLORIDE 0.9 % IV SOLN
500.0000 mg | INTRAVENOUS | Status: DC
  Administered 2023-04-24 – 2023-04-25 (×3): 500 mg via INTRAVENOUS
  Filled 2023-04-23 (×3): qty 5

## 2023-04-23 MED ORDER — CYCLOBENZAPRINE HCL 10 MG PO TABS
10.0000 mg | ORAL_TABLET | Freq: Two times a day (BID) | ORAL | Status: DC | PRN
  Administered 2023-04-25: 10 mg via ORAL
  Filled 2023-04-23: qty 1

## 2023-04-23 MED ORDER — LORATADINE 10 MG PO TABS
10.0000 mg | ORAL_TABLET | Freq: Every day | ORAL | Status: DC
  Administered 2023-04-24 – 2023-04-27 (×4): 10 mg via ORAL
  Filled 2023-04-23 (×4): qty 1

## 2023-04-23 MED ORDER — LINACLOTIDE 72 MCG PO CAPS
72.0000 ug | ORAL_CAPSULE | Freq: Every morning | ORAL | Status: DC
  Administered 2023-04-25 – 2023-04-27 (×3): 72 ug via ORAL
  Filled 2023-04-23 (×4): qty 1

## 2023-04-23 MED ORDER — MAGNESIUM HYDROXIDE 400 MG/5ML PO SUSP: 30.0000 mL | Freq: Every day | ORAL | Status: DC | PRN

## 2023-04-23 MED ORDER — NALOXONE HCL 4 MG/0.1ML NA LIQD
1.0000 | Freq: Once | NASAL | Status: DC | PRN
Start: 2023-04-23 — End: 2023-04-27

## 2023-04-23 MED ORDER — ONDANSETRON HCL 4 MG PO TABS: 4.0000 mg | ORAL_TABLET | Freq: Four times a day (QID) | ORAL | Status: DC | PRN

## 2023-04-23 MED ORDER — ACETAMINOPHEN 325 MG PO TABS
650.0000 mg | ORAL_TABLET | Freq: Four times a day (QID) | ORAL | Status: DC | PRN
  Administered 2023-04-25: 650 mg via ORAL
  Filled 2023-04-23: qty 2

## 2023-04-23 MED ORDER — GUAIFENESIN ER 600 MG PO TB12
600.0000 mg | ORAL_TABLET | Freq: Two times a day (BID) | ORAL | Status: DC
  Administered 2023-04-24 – 2023-04-27 (×8): 600 mg via ORAL
  Filled 2023-04-23 (×8): qty 1

## 2023-04-23 MED ORDER — DULOXETINE HCL 30 MG PO CPEP
90.0000 mg | ORAL_CAPSULE | Freq: Every day | ORAL | Status: DC
  Administered 2023-04-24 – 2023-04-27 (×4): 90 mg via ORAL
  Filled 2023-04-23: qty 1
  Filled 2023-04-23 (×2): qty 3
  Filled 2023-04-23: qty 1

## 2023-04-23 MED ORDER — LACTATED RINGERS IV SOLN: INTRAVENOUS | Status: DC

## 2023-04-23 NOTE — Progress Notes (Signed)
 CODE SEPSIS - PHARMACY COMMUNICATION  **Broad Spectrum Antibiotics should be administered within 1 hour of Sepsis diagnosis**  Time Code Sepsis Called/Page Received: 2016  Antibiotics Ordered: Cefepime, metronidazole, vancomycin  Time of 1st antibiotic administration: 2045  Additional action taken by pharmacy: N/A  If necessary, Name of Provider/Nurse Contacted: N/A    Merryl Hacker ,PharmD Clinical Pharmacist  04/23/2023  8:19 PM

## 2023-04-23 NOTE — ED Notes (Signed)
 Pt changed from NRB mask at 10L to O2 via [place] @ 6L.

## 2023-04-23 NOTE — ED Notes (Signed)
 Daughter at bedside states that pt demonstrated increased work of breathing at home this evening and "didn't seem herself". Daughter clarified to state that her mother seemed confused.

## 2023-04-23 NOTE — Assessment & Plan Note (Signed)
-   We will continue her inhalers while holding off long-acting beta agonist.

## 2023-04-23 NOTE — Assessment & Plan Note (Signed)
 -  We will continue PPI therapy

## 2023-04-23 NOTE — ED Triage Notes (Signed)
 Pt BIB EMS with c/o SOB that started today. Pt has hx of COPD and astham. Pt was hypoxic at 77% on RA when EMS arrived. Pt was put on NRB and sats increased to 97%. Pt endorses cough x4-5 days. Pt denies fevers.    Duoneb x1 148/84 HR 135 CBG 148 18G L FA

## 2023-04-23 NOTE — Sepsis Progress Note (Signed)
 Elink monitoring for the code sepsis protocol.

## 2023-04-23 NOTE — ED Provider Notes (Signed)
 [facility] Provider Note    Event Date/Time   First MD Initiated Contact with Patient 04/23/23 1950     (approximate)  History   Chief Complaint: Shortness of Breath  HPI  Laurie Berg is a 63 y.o. female with a past medical history of COPD, diabetes, on chronic oxygen 2 L as needed who presents to the emergency department for worsening shortness of breath and cough x 4 to 5 days.  Patient did not know she had a fever, currently 103 in the emergency department.  Patient denies any known sick contacts.  Patient wears oxygen only when needed, currently satting 77% on room air placed on a nonrebreather now satting in the upper 90s.  Patient does have an occasional cough.  Patient denies any chest pain or abdominal pain.  Denies any nausea vomiting or diarrhea.  Physical Exam   Triage Vital Signs: ED Triage Vitals  Encounter Vitals Group     BP 04/23/23 1950 118/69     Systolic BP Percentile --      Diastolic BP Percentile --      Pulse Rate 04/23/23 1950 (!) 129     Resp 04/23/23 1950 (!) 21     Temp 04/23/23 1950 (!) 103 F (39.4 C)     Temp Source 04/23/23 1950 Oral     SpO2 04/23/23 1950 92 %     Weight 04/23/23 1948 157 lb (71.2 kg)     Height --      Head Circumference --      Peak Flow --      Pain Score 04/23/23 1948 0     Pain Loc --      Pain Education --      Exclude from Growth Chart --     Most recent vital signs: Vitals:   04/23/23 1950  BP: 118/69  Pulse: (!) 129  Resp: (!) 21  Temp: (!) 103 F (39.4 C)  SpO2: 92%    General: Awake, mild tachypnea but otherwise no acute distress. CV:  Good peripheral perfusion.  Regular rate and rhythm  Resp:  Moderate tachypnea.  No obvious wheeze rales or rhonchi. Abd:  No distention.  Soft, nontender.  No rebound or guarding.   ED Results / Procedures / Treatments   EKG  EKG viewed and interpreted by myself shows sinus tachycardia 130 bpm with a narrow QRS, normal axis, normal  intervals besides slight QTc prolongation, nonspecific ST changes.  RADIOLOGY  I have reviewed interpreted chest x-ray images.  Patient appears to have increased opacification in the lower lobes bilaterally right greater than left concerning for possible pneumonia.   MEDICATIONS ORDERED IN ED: Medications  lactated ringers infusion (has no administration in time range)  lactated ringers bolus 1,000 mL (has no administration in time range)  ceFEPIme (MAXIPIME) 2 g in sodium chloride 0.9 % 100 mL IVPB (has no administration in time range)  metroNIDAZOLE (FLAGYL) IVPB 500 mg (has no administration in time range)  vancomycin (VANCOCIN) IVPB 1000 mg/200 mL premix (has no administration in time range)     IMPRESSION / MDM / ASSESSMENT AND PLAN / ED COURSE  I reviewed the triage vital signs and the nursing notes.  Patient's presentation is most consistent with acute presentation with potential threat to life or bodily function.  Patient presents to the emergency department for shortness of breath and cough over the last for 5 days worsening per patient.  Patient satting in the 68s on  room air, currently satting in the upper 90s on a nonrebreather mask.  Will attempt to titrate as tolerated.  Patient has had cough for 4 days she is tachycardic tachypneic and febrile in the emergency department.  Given the patient's hypoxia tachypnea tachycardia and febrile state I have ordered sepsis protocol labs and antibiotics for the patient.  Differential quite broad but would include pneumonia, URI, viral infection such as COVID or influenza, among other infectious etiology.  Patient's lab work today shows a reassuring CBC with a normal white blood cell count, reassuring chemistry.  Patient's lactate is slightly elevated at 2.3.  Respiratory panel is negative for COVID/flu/RSV.  Urinalysis shows no concerning finding.  However given the patient's fever to 103, tachycardia, tachypnea and hypoxia patient will be  admitted to the hospital service for further workup and treatment.  We will continue with IV antibiotics.  CRITICAL CARE Performed by: Minna Antis   Total critical care time: 30 minutes  Critical care time was exclusive of separately billable procedures and treating other patients.  Critical care was necessary to treat or prevent imminent or life-threatening deterioration.  Critical care was time spent personally by me on the following activities: development of treatment plan with patient and/or surrogate as well as nursing, discussions with consultants, evaluation of patient's response to treatment, examination of patient, obtaining history from patient or surrogate, ordering and performing treatments and interventions, ordering and review of laboratory studies, ordering and review of radiographic studies, pulse oximetry and re-evaluation of patient's condition.   FINAL CLINICAL IMPRESSION(S) / ED DIAGNOSES   Fever Sepsis   Note:  This document was prepared using Dragon voice recognition software and may include unintentional dictation errors.   Minna Antis, MD 04/23/23 2320

## 2023-04-23 NOTE — Assessment & Plan Note (Signed)
-   We will continue her inhalers and hold off long-acting beta agonist.

## 2023-04-23 NOTE — Assessment & Plan Note (Addendum)
-   The patient will be admitted to a medical telemetry bed. - Will continue antibiotic therapy with IV Rocephin and Zithromax. - Mucolytic therapy be provided as well as duo nebs q.i.d. and q.4 hours p.r.n. - We will follow blood cultures. - Sepsis is manifested by fever, tachycardia and tachypnea

## 2023-04-23 NOTE — ED Notes (Signed)
 Vera with CCMD notified of cardiac monitoring order. Pt information and location verified with Vera.

## 2023-04-23 NOTE — Assessment & Plan Note (Signed)
-   The patient will be placed on supplemental coverage with NovoLog. - We will hold off metformin. 

## 2023-04-23 NOTE — H&P (Signed)
 [place]   PATIENT NAME: Laurie Berg    MR#:  [id number]  DATE OF BIRTH:  30-Jul-1960  DATE OF ADMISSION:  04/23/2023  PRIMARY CARE PHYSICIAN: Leanna Sato, MD   Patient is coming from: Home  REQUESTING/REFERRING PHYSICIAN: Minna Antis, MD  CHIEF COMPLAINT:   Chief Complaint  Patient presents with   Shortness of Breath    HISTORY OF PRESENT ILLNESS:  Laurie Berg is a 63 y.o. female with medical history significant for coronary artery disease, type 2 diabetes mellitus and COPD, on home O2 at 2 L/min, who presented to the emergency room with onset of dyspnea and intermittent dry congested cough which have been worsening over the last 4 to 5 days.  She had a fever 103 here.  Pulse oximetry was 77% on room air.  She was placed on nonrebreather with pulse oximetry improving to 99%.  She admitted to cough with wheezing.  No chest pain or palpitations.  No nausea or vomiting or abdominal pain.  No dysuria, oliguria or hematuria or flank pain.  No bleeding diathesis.  The patient continues to smoke 4 to 5 cigarettes/day.  ED Course: When the patient came to the ER, temperature was 103 with heart rate of 129 respiratory rate 21 and later 28 with pulse symmetry of 92% on 100% nonrebreather and later 99% then 95% on 6 L O2 by nasal cannula..  Labs revealed unremarkable CMP and calcium of 8.6.  Lactic acid was 2.3 and later 1.6 and CBC was within normal. EKG as reviewed by me : EKG showed sinus tachycardia with rate 130 with nonspecific intraventricular conduction delay Imaging: Portable chest x-ray showed emphysema and bronchitic changes with no acute cardiopulmonary disease.  The patient was given 2 L bolus of IV lactated ringer and 2 g of IV cefepime, 500 mg of IV Flagyl and 1 g of IV vancomycin.  She will be admitted to a progressive unit bed for further evaluation and management. PAST MEDICAL HISTORY:   Past Medical History:  Diagnosis Date   Cervical  radiculopathy    Chronic pain syndrome    Colon polyps    COPD (chronic obstructive pulmonary disease) (HCC)    Coronary artery calcification seen on CT scan    a. 04/2021 High Res CT Chest: Ao atherosclerosis, cor Ca2+; b. 07/2021 MV: EF 69%, no isch/infarct, LAD/LCX Ca2+ noted; c. 02/2022 CT Chest: Cor and Ao atherosclerosis.   COVID-19 virus infection    Diabetes mellitus without complication (HCC)    History of echocardiogram    a. 08/2021 Echo: EF 55-60%, no rwma, nl RV fxn.   Lung nodule    a. 02/2022 CT Chest: RLL 7mm solid subpleural pulm nodule, prev 39mm-->rec thoracic surgery eval.   Sepsis (HCC)    Tobacco abuse     PAST SURGICAL HISTORY:   Past Surgical History:  Procedure Laterality Date   CARPAL TUNNEL RELEASE     COLONOSCOPY WITH PROPOFOL N/A 10/21/2019   Procedure: COLONOSCOPY WITH PROPOFOL;  Surgeon: Pasty Spillers, MD;  Location: [facility] ENDOSCOPY;  Service: Endoscopy;  Laterality: N/A;   DILATION AND CURETTAGE OF UTERUS     TUBAL LIGATION      SOCIAL HISTORY:   Social History   Tobacco Use   Smoking status: Every Day    Current packs/day: 1.75    Average packs/day: 1.8 packs/day for 48.0 years (84.0 ttl pk-yrs)    Types: Cigarettes   Smokeless tobacco: Never  Substance Use  Topics   Alcohol use: Not Currently    FAMILY HISTORY:  History reviewed. No pertinent family history. Negative for familial diseases. DRUG ALLERGIES:   Allergies  Allergen Reactions   Pregabalin Shortness Of Breath and Swelling    "Felt like I couldn't breathe and my tongue was swelling up"   Sulfamethoxazole-Trimethoprim Itching    REVIEW OF SYSTEMS:   ROS As per history of present illness. All pertinent systems were reviewed above. Constitutional, HEENT, cardiovascular, respiratory, GI, GU, musculoskeletal, neuro, psychiatric, endocrine, integumentary and hematologic systems were reviewed and are otherwise negative/unremarkable except for positive findings mentioned above  in the HPI.   MEDICATIONS AT HOME:   Prior to Admission medications   Medication Sig Start Date End Date Taking? Authorizing Provider  albuterol (VENTOLIN HFA) 108 (90 Base) MCG/ACT inhaler Inhale 1 puff into the lungs as needed. 09/11/19   [provider]  aspirin 81 MG chewable tablet Chew 1 tablet (81 mg total) by mouth daily. 03/22/22   Darlin Priestly, MD  Aspirin-Salicylamide-Caffeine [facility] HEADACHE POWDER PO) Take 1 packet by mouth as needed (pain).    [provider]  atorvastatin (LIPITOR) 20 MG tablet Take 1 tablet (20 mg total) by mouth daily. 04/26/22   Debbe Odea, MD  Blood Glucose Monitoring Suppl DEVI 1 each by Does not apply route in the morning, at noon, and at bedtime. May substitute to any manufacturer covered by patient's insurance. 03/21/22   Darlin Priestly, MD  cetirizine (ZYRTEC) 10 MG tablet Take 10 mg by mouth daily. 09/25/19   [provider]  cyclobenzaprine (FLEXERIL) 10 MG tablet Take 10 mg by mouth 2 (two) times daily as needed for muscle spasms. 09/19/21   [provider]  DULoxetine (CYMBALTA) 30 MG capsule Take 90 mg by mouth daily.    [provider]  ipratropium-albuterol (DUONEB) 0.5-2.5 (3) MG/3ML SOLN Take 3 mLs by nebulization every 6 (six) hours as needed. 03/21/22   Darlin Priestly, MD  LINZESS 72 MCG capsule Take 72 mcg by mouth every morning. 03/22/21   [provider]  metFORMIN (GLUMETZA) 500 MG (MOD) 24 hr tablet Take 2 tablets (1,000 mg total) by mouth 2 (two) times daily with a meal. 03/21/22 06/19/22  Darlin Priestly, MD  Multiple Vitamin (MULTIVITAMIN WITH MINERALS) TABS tablet Take 1 tablet by mouth daily. 03/22/22   Darlin Priestly, MD  naloxone [facility]) nasal spray 4 mg/0.1 mL One spray in either nostril once for known/suspected opioid overdose. May repeat every 2-3 minutes in alternating nostril til EMS arrives 12/13/21   [provider]  nicotine (NICODERM CQ - DOSED IN MG/24 HOURS) 21 mg/24hr patch Place 1 patch (21 mg  total) onto the skin daily. 03/22/22   Darlin Priestly, MD  oxyCODONE-acetaminophen (PERCOCET/ROXICET) 5-325 MG tablet Take 1 tablet by mouth every 6 (six) hours as needed.    [provider]  OXYCONTIN 30 MG 12 hr tablet Take 1 tablet by mouth 2 (two) times daily. 06/26/21   [provider]  predniSONE (DELTASONE) 5 MG tablet Take 6 tablets (30 mg) on 2/7, 5 tablets on 2/8, 4 tablets on 2/9, 3 tablets on 2/10, 2 tablets on 2/11, 1 tablet on 2/12, then done. 03/27/22   Darlin Priestly, MD  TRELEGY ELLIPTA 100-62.5-25 MCG/ACT AEPB Take 1 puff by mouth daily. 07/04/21   [provider]      VITAL SIGNS:  Blood pressure 129/67, pulse (!) 125, temperature 100.2 F (37.9 C), temperature source Oral, resp. rate (!) 24, weight  71.2 kg, SpO2 95%.  PHYSICAL EXAMINATION:  Physical Exam  GENERAL:  63 y.o.-year-old female patient lying in the bed with mild respiratory distress with conversational dyspnea. EYES: Pupils equal, round, reactive to light and accommodation. No scleral icterus. Extraocular muscles intact.  HEENT: Head atraumatic, normocephalic. Oropharynx and nasopharynx clear.  NECK:  Supple, no jugular venous distention. No thyroid enlargement, no tenderness.  LUNGS: Diminished bibasal breath sounds with mild bibasal crackles.  No use of accessory muscles of respiration.  CARDIOVASCULAR: Regular rate and rhythm, S1, S2 normal. No murmurs, rubs, or gallops.  ABDOMEN: Soft, nondistended, nontender. Bowel sounds present. No organomegaly or mass.  EXTREMITIES: No pedal edema, cyanosis, or clubbing.  NEUROLOGIC: Cranial nerves II through XII are intact. Muscle strength 5/5 in all extremities. Sensation intact. Gait not checked.  PSYCHIATRIC: The patient is alert and oriented x 3.  Normal affect and good eye contact. SKIN: No obvious rash, lesion, or ulcer.   LABORATORY PANEL:   CBC Recent Labs  Lab 04/23/23 2016  WBC 10.4  HGB 14.0  HCT 43.2  PLT 236    ------------------------------------------------------------------------------------------------------------------  Chemistries  Recent Labs  Lab 04/23/23 2016  NA 135  K 4.0  CL 101  CO2 23  GLUCOSE 129*  BUN 13  CREATININE 0.76  CALCIUM 8.6*  AST 23  ALT 15  ALKPHOS 61  BILITOT 0.6   ------------------------------------------------------------------------------------------------------------------  Cardiac Enzymes No results for input(s): "TROPONINI" in the last 168 hours. ------------------------------------------------------------------------------------------------------------------  RADIOLOGY:  DG Chest Port 1 View Result Date: 04/23/2023 CLINICAL DATA:  Possible sepsis EXAM: PORTABLE CHEST 1 VIEW COMPARISON:  03/21/2022 FINDINGS: Emphysema and bronchitic changes. No consolidation, pleural effusion or pneumothorax. Normal cardiac size. Aortic atherosclerosis. IMPRESSION: No active disease. Emphysema and bronchitic changes. Electronically Signed   By: Jasmine Pang M.D.   On: 04/23/2023 23:24      IMPRESSION AND PLAN:  Assessment and Plan: * Sepsis due to pneumonia [facility]) - The patient will be admitted to a medical telemetry bed. - Will continue antibiotic therapy with IV Rocephin and Zithromax. - Mucolytic therapy be provided as well as duo nebs q.i.d. and q.4 hours p.r.n. - We will follow blood cultures. - Sepsis is manifested by fever, tachycardia and tachypnea   Dyslipidemia - We will continue statin therapy.  COPD (chronic obstructive pulmonary disease) (HCC) We will continue her inhalers while holding off long-acting beta agonist.  IBS (irritable bowel syndrome) - We will continue Linzess.  Type 2 diabetes mellitus without complications (HCC) - The patient will be placed on supplemental coverage with NovoLog. - We will hold off metformin.  GERD without esophagitis - We will continue PPI therapy.    DVT prophylaxis: Lovenox.  Advanced [facility]  Planning:  Code Status: The patient is DNR only. Family Communication:  The plan of care was discussed in details with the patient (and family). I answered all questions. The patient agreed to proceed with the above mentioned plan. Further management will depend upon hospital course. Disposition Plan: Back to previous home environment Consults called: none.  All the records are reviewed and case discussed with ED provider.  Status is: Inpatient  At the time of the admission, it appears that the appropriate admission status for this patient is inpatient.  This is judged to be reasonable and necessary in order to provide the required intensity of service to ensure the patient's safety given the presenting symptoms, physical exam findings and initial radiographic and laboratory data in the context of comorbid conditions.  The  patient requires inpatient status due to high intensity of service, high risk of further deterioration and high frequency of surveillance required.  I certify that at the time of admission, it is my clinical judgment that the patient will require inpatient hospital care extending more than 2 midnights.                            Dispo: The patient is from: Home              Anticipated d/c is to: Home              Patient currently is not medically stable to d/c.              Difficult to place patient: No  Hannah Beat M.D on 04/23/2023 at 11:51 PM  [facility]   From 7 PM-7 AM, contact night-coverage [email or web address]  CC: Primary care physician; Leanna Sato, MD

## 2023-04-23 NOTE — Assessment & Plan Note (Signed)
-   We will continue Linzess.

## 2023-04-23 NOTE — Assessment & Plan Note (Signed)
 -  We will continue statin therapy.

## 2023-04-24 ENCOUNTER — Inpatient Hospital Stay

## 2023-04-24 DIAGNOSIS — J189 Pneumonia, unspecified organism: Secondary | ICD-10-CM | POA: Diagnosis not present

## 2023-04-24 DIAGNOSIS — A419 Sepsis, unspecified organism: Secondary | ICD-10-CM | POA: Diagnosis not present

## 2023-04-24 LAB — BASIC METABOLIC PANEL
Anion gap: 9 (ref 5–15)
BUN: 10 mg/dL (ref 8–23)
CO2: 23 mmol/L (ref 22–32)
Calcium: 8.5 mg/dL — ABNORMAL LOW (ref 8.9–10.3)
Chloride: 103 mmol/L (ref 98–111)
Creatinine, Ser: 0.58 mg/dL (ref 0.44–1.00)
GFR, Estimated: >60 mL/min (ref >60)
Glucose, Bld: 125 mg/dL — ABNORMAL HIGH (ref 70–99)
Potassium: 4 mmol/L (ref 3.5–5.1)
Sodium: 135 mmol/L (ref 135–145)

## 2023-04-24 LAB — CBC
HCT: 38.4 % (ref 36.0–46.0)
Hemoglobin: 12.7 g/dL (ref 12.0–15.0)
MCH: 30 pg (ref 26.0–34.0)
MCHC: 33.1 g/dL (ref 30.0–36.0)
MCV: 90.8 fL (ref 80.0–100.0)
Platelets: 208 10*3/uL (ref 150–400)
RBC: 4.23 MIL/uL (ref 3.87–5.11)
RDW: 16.1 % — ABNORMAL HIGH (ref 11.5–15.5)
WBC: 9.2 10*3/uL (ref 4.0–10.5)
nRBC: 0 % (ref 0.0–0.2)

## 2023-04-24 LAB — RESPIRATORY PANEL BY PCR

## 2023-04-24 LAB — PROCALCITONIN: Procalcitonin: 0.27 ng/mL

## 2023-04-24 LAB — CBG MONITORING, ED: Glucose-Capillary: 130 mg/dL — ABNORMAL HIGH (ref 70–99)

## 2023-04-24 LAB — HIV ANTIBODY (ROUTINE TESTING W REFLEX): HIV Screen 4th Generation wRfx: NONREACTIVE

## 2023-04-24 LAB — CORTISOL-AM, BLOOD: Cortisol - AM: 10.8 ug/dL (ref 6.7–22.6)

## 2023-04-24 LAB — PROTIME-INR
INR: 1.1 (ref 0.8–1.2)
Prothrombin Time: 14.2 s (ref 11.4–15.2)

## 2023-04-24 MED ORDER — METHYLPREDNISOLONE SODIUM SUCC 125 MG IJ SOLR
80.0000 mg | Freq: Every day | INTRAMUSCULAR | Status: AC
  Administered 2023-04-24 – 2023-04-25 (×2): 80 mg via INTRAVENOUS
  Filled 2023-04-24 (×2): qty 2

## 2023-04-24 MED ORDER — FUROSEMIDE 10 MG/ML IJ SOLN
40.0000 mg | Freq: Once | INTRAMUSCULAR | Status: AC
  Administered 2023-04-24: 40 mg via INTRAVENOUS
  Filled 2023-04-24: qty 4

## 2023-04-24 MED ORDER — ARFORMOTEROL TARTRATE 15 MCG/2ML IN NEBU
15.0000 ug | INHALATION_SOLUTION | Freq: Two times a day (BID) | RESPIRATORY_TRACT | Status: DC
  Administered 2023-04-24 – 2023-04-27 (×6): 15 ug via RESPIRATORY_TRACT
  Filled 2023-04-24 (×9): qty 2

## 2023-04-24 NOTE — ED Notes (Signed)
 Brovana not available from pharmacy @ this time pt has familyx4 @ bs and is eating [employer]

## 2023-04-24 NOTE — ED Notes (Signed)
 PT called to use restroom,while assisting to bathroom I noticed pt seemed confused on how to walk and wash hands . MD Ward and RN Judeth Cornfield made aware.

## 2023-04-24 NOTE — ED Notes (Signed)
 Brovana not available @this  time pharmacy notified per pharmacy will send to ed

## 2023-04-24 NOTE — Progress Notes (Signed)
 PROGRESS NOTE    Laurie Berg  GMW:[id number] DOB: November 16, 1960 DOA: 04/23/2023 PCP: Leanna Sato, MD    Brief Narrative:   63 y.o. female with medical history significant for coronary artery disease, type 2 diabetes mellitus and COPD, on home O2 at 2 L/min, who presented to the emergency room with onset of dyspnea and intermittent dry congested cough which have been worsening over the last 4 to 5 days.  She had a fever 103 here.  Pulse oximetry was 77% on room air.  She was placed on nonrebreather with pulse oximetry improving to 99%.  She admitted to cough with wheezing.  No chest pain or palpitations.  No nausea or vomiting or abdominal pain.  No dysuria, oliguria or hematuria or flank pain.  No bleeding diathesis.  The patient continues to smoke 4 to 5 cigarettes/day.    Assessment & Plan:   Principal Problem:   Sepsis due to pneumonia [facility]) Active Problems:   Dyslipidemia   GERD without esophagitis   Type 2 diabetes mellitus without complications (HCC)   IBS (irritable bowel syndrome)   COPD (chronic obstructive pulmonary disease) (HCC)  Acute on chronic hypoxic respiratory failure Acute decompensated COPD Unclear trigger.  Possible viral syndrome though COVID, flu, RSV, RVP all negative.  No infiltrate or white blood cell count to suggest bacterial pneumonia. Plan: IV Solu-Medrol IV Lasix 40 mg x 1, reassess daily No fluids Bronchodilators Incentive spirometry, flutter Wean oxygen as tolerated, goal saturation 88-92  Hyperlipidemia PTA statin  Possible sepsis, ruled out SIRS criteria likely driven by decompensated COPD.  No clinical indication of infection.  On empiric azithromycin.  Rocephin discontinued.  Type 2 diabetes mellitus without complication Hold home metformin.  Sliding scale coverage for now.  GERD PPI  DVT prophylaxis: SQ Lovenox Code Status: DNR Family Communication: Daughter at bedside 3/11 Disposition Plan: Status is: Inpatient Remains  inpatient appropriate because: Acute on chronic respiratory failure   Level of care: Progressive  Consultants:  None  Procedures:  None  Antimicrobials: Azithromycin   Subjective: Seen and examined.  Resting in bed.  Reports improvement in respiratory status since admission.  Remains fatigued.  Objective: Vitals:   04/24/23 0900 04/24/23 1200 04/24/23 1300 04/24/23 1457  BP: 116/60 106/69 107/73   Pulse: (!) 102 (!) 107 (!) 104   Resp: (!) 27 (!) 23 (!) 25   Temp:    98.2 F (36.8 C)  TempSrc:    Oral  SpO2: 92% 93% 91%   Weight:      Height:       No intake or output data in the 24 hours ending 04/24/23 1542 Filed Weights   04/23/23 1948 04/24/23 0147  Weight: 71.2 kg 74.8 kg    Examination:  General exam: Appears calm and comfortable  Respiratory system: Diminished breath sounds.  Scattered crackles.  Mild end expiratory wheeze.  Normal work of breathing.  6 L Cardiovascular system: S1-2, RRR, no murmurs, no pedal edema Gastrointestinal system: Soft, NT/ND, normal bowel sounds Central nervous system: Alert and oriented. No focal neurological deficits. Extremities: Symmetric 5 x 5 power. Skin: No rashes, lesions or ulcers Psychiatry: Judgement and insight appear normal. Mood & affect appropriate.     Data Reviewed: I have personally reviewed following labs and imaging studies  CBC: Recent Labs  Lab 04/23/23 2016 04/24/23 0453  WBC 10.4 9.2  NEUTROABS 9.1*  --   HGB 14.0 12.7  HCT 43.2 38.4  MCV 91.3 90.8  PLT 236 208  Basic Metabolic Panel: Recent Labs  Lab 04/23/23 2016 04/24/23 0453  NA 135 135  K 4.0 4.0  CL 101 103  CO2 23 23  GLUCOSE 129* 125*  BUN 13 10  CREATININE 0.76 0.58  CALCIUM 8.6* 8.5*   GFR: Estimated Creatinine Clearance: 73.1 mL/min (by C-G formula based on SCr of 0.58 mg/dL). Liver Function Tests: Recent Labs  Lab 04/23/23 2016  AST 23  ALT 15  ALKPHOS 61  BILITOT 0.6  PROT 6.9  ALBUMIN 3.6   No results for  input(s): "LIPASE", "AMYLASE" in the last 168 hours. No results for input(s): "AMMONIA" in the last 168 hours. Coagulation Profile: Recent Labs  Lab 04/23/23 2016 04/24/23 0453  INR 1.0 1.1   Cardiac Enzymes: No results for input(s): "CKTOTAL", "CKMB", "CKMBINDEX", "TROPONINI" in the last 168 hours. BNP (last 3 results) No results for input(s): "PROBNP" in the last 8760 hours. HbA1C: No results for input(s): "HGBA1C" in the last 72 hours. CBG: Recent Labs  Lab 04/24/23 0537  GLUCAP 130*   Lipid Profile: No results for input(s): "CHOL", "HDL", "LDLCALC", "TRIG", "CHOLHDL", "LDLDIRECT" in the last 72 hours. Thyroid Function Tests: No results for input(s): "TSH", "T4TOTAL", "FREET4", "T3FREE", "THYROIDAB" in the last 72 hours. Anemia Panel: No results for input(s): "VITAMINB12", "FOLATE", "FERRITIN", "TIBC", "IRON", "RETICCTPCT" in the last 72 hours. Sepsis Labs: Recent Labs  Lab 04/23/23 2016 04/23/23 2159 04/24/23 0453  PROCALCITON  --   --  0.27  LATICACIDVEN 2.3* 1.6  --     Recent Results (from the past 240 hours)  Resp panel by RT-PCR (RSV, Flu A&B, Covid) Anterior Nasal Swab     Status: None   Collection Time: 04/23/23  8:16 PM   Specimen: Anterior Nasal Swab  Result Value Ref Range Status   SARS Coronavirus 2 by RT PCR NEGATIVE NEGATIVE Final    Comment: (NOTE) SARS-CoV-2 target nucleic acids are NOT DETECTED.  The SARS-CoV-2 RNA is generally detectable in upper respiratory specimens during the acute phase of infection. The lowest concentration of SARS-CoV-2 viral copies this assay can detect is 138 copies/mL. A negative result does not preclude SARS-Cov-2 infection and should not be used as the sole basis for treatment or other patient management decisions. A negative result may occur with  improper specimen collection/handling, submission of specimen other than nasopharyngeal swab, presence of viral mutation(s) within the areas targeted by this assay, and  inadequate number of viral copies(<138 copies/mL). A negative result must be combined with clinical observations, patient history, and epidemiological information. The expected result is Negative.  Fact Sheet for Patients:  BloggerCourse.com  Fact Sheet for Healthcare Providers:  SeriousBroker.it  This test is no t yet approved or cleared by the [place] FDA and  has been authorized for detection and/or diagnosis of SARS-CoV-2 by FDA under an Emergency Use Authorization (EUA). This EUA will remain  in effect (meaning this test can be used) for the duration of the COVID-19 declaration under Section 564(b)(1) of the Act, 21 U.S.C.section 360bbb-3(b)(1), unless the authorization is terminated  or revoked sooner.       Influenza A by PCR NEGATIVE NEGATIVE Final   Influenza B by PCR NEGATIVE NEGATIVE Final    Comment: (NOTE) The Xpert Xpress SARS-CoV-2/FLU/RSV plus assay is intended as an aid in the diagnosis of influenza from Nasopharyngeal swab specimens and should not be used as a sole basis for treatment. Nasal washings and aspirates are unacceptable for Xpert Xpress SARS-CoV-2/FLU/RSV testing.  Fact Sheet for Patients:  BloggerCourse.com  Fact Sheet for Healthcare Providers: SeriousBroker.it  This test is not yet approved or cleared by the [place] FDA and has been authorized for detection and/or diagnosis of SARS-CoV-2 by FDA under an Emergency Use Authorization (EUA). This EUA will remain in effect (meaning this test can be used) for the duration of the COVID-19 declaration under Section 564(b)(1) of the Act, 21 U.S.C. section 360bbb-3(b)(1), unless the authorization is terminated or revoked.     Resp Syncytial Virus by PCR NEGATIVE NEGATIVE Final    Comment: (NOTE) Fact Sheet for Patients: BloggerCourse.com  Fact Sheet for Healthcare  Providers: SeriousBroker.it  This test is not yet approved or cleared by the [place] FDA and has been authorized for detection and/or diagnosis of SARS-CoV-2 by FDA under an Emergency Use Authorization (EUA). This EUA will remain in effect (meaning this test can be used) for the duration of the COVID-19 declaration under Section 564(b)(1) of the Act, 21 U.S.C. section 360bbb-3(b)(1), unless the authorization is terminated or revoked.  Performed at [facility], [address] [place], [place] [address]   Blood Culture (routine x 2)     Status: None (Preliminary result)   Collection Time: 04/23/23  8:16 PM   Specimen: BLOOD  Result Value Ref Range Status   Specimen Description BLOOD LEFT ANTECUBITAL  Final   Special Requests   Final    BOTTLES DRAWN AEROBIC AND ANAEROBIC Blood Culture results may not be optimal due to an inadequate volume of blood received in culture bottles   Culture   Final    NO GROWTH < 12 HOURS Performed at [facility], [address] [place], [place] [address]    Report Status PENDING  Incomplete  Blood Culture (routine x 2)     Status: None (Preliminary result)   Collection Time: 04/23/23  8:21 PM   Specimen: BLOOD  Result Value Ref Range Status   Specimen Description BLOOD RIGHT ANTECUBITAL  Final   Special Requests   Final    BOTTLES DRAWN AEROBIC AND ANAEROBIC Blood Culture adequate volume   Culture   Final    NO GROWTH < 12 HOURS Performed at [facility], [address] [place], [place] [address]    Report Status PENDING  Incomplete  Respiratory (~20 pathogens) panel by PCR     Status: None   Collection Time: 04/24/23  8:25 AM   Specimen: Nasopharyngeal Swab; Respiratory  Result Value Ref Range Status   Adenovirus NOT DETECTED NOT DETECTED Final   Coronavirus 229E NOT DETECTED NOT DETECTED Final    Comment: (NOTE) The Coronavirus on the Respiratory Panel, DOES NOT test for the  novel  Coronavirus (2019 nCoV)    Coronavirus HKU1 NOT DETECTED NOT DETECTED Final   Coronavirus NL63 NOT DETECTED NOT DETECTED Final   Coronavirus OC43 NOT DETECTED NOT DETECTED Final   Metapneumovirus NOT DETECTED NOT DETECTED Final   Rhinovirus / Enterovirus NOT DETECTED NOT DETECTED Final   Influenza A NOT DETECTED NOT DETECTED Final   Influenza B NOT DETECTED NOT DETECTED Final   Parainfluenza Virus 1 NOT DETECTED NOT DETECTED Final   Parainfluenza Virus 2 NOT DETECTED NOT DETECTED Final   Parainfluenza Virus 3 NOT DETECTED NOT DETECTED Final   Parainfluenza Virus 4 NOT DETECTED NOT DETECTED Final   Respiratory Syncytial Virus NOT DETECTED NOT DETECTED Final   Bordetella pertussis NOT DETECTED NOT DETECTED Final   Bordetella Parapertussis NOT DETECTED NOT DETECTED Final   Chlamydophila pneumoniae NOT DETECTED  NOT DETECTED Final   Mycoplasma pneumoniae NOT DETECTED NOT DETECTED Final    Comment: Performed at [facility], [address], [place], [place] [address]         Radiology Studies: CT HEAD WO CONTRAST ( ) Result Date: 04/24/2023 CLINICAL DATA:  Mental status change. EXAM: CT HEAD WITHOUT CONTRAST TECHNIQUE: Contiguous axial images were obtained from the base of the skull through the vertex without intravenous contrast. RADIATION DOSE REDUCTION: This exam was performed according to the departmental dose-optimization program which includes automated exposure control, adjustment of the mA and/or kV according to patient size and/or use of iterative reconstruction technique. COMPARISON:  None Available. FINDINGS: Brain: No acute intracranial hemorrhage. No CT evidence of acute infarct. Nonspecific hypoattenuation in the periventricular and subcortical white matter favored to reflect chronic microvascular ischemic changes. No edema, mass effect, or midline shift. The basilar cisterns are patent. Ventricles: The ventricles are normal. Vascular: Atherosclerotic calcifications  of the carotid siphons and intracranial vertebral arteries. No hyperdense vessel. Skull: No acute or aggressive finding. Orbits: Orbits are symmetric. Sinuses: The visualized paranasal sinuses are clear. Other: Mastoid air cells are clear. IMPRESSION: 1. No acute intracranial abnormality. 2. Moderate chronic microvascular ischemic changes. Electronically Signed   By: Emily Filbert M.D.   On: 04/24/2023 10:38   DG Chest Port 1 View Result Date: 04/23/2023 CLINICAL DATA:  Possible sepsis EXAM: PORTABLE CHEST 1 VIEW COMPARISON:  03/21/2022 FINDINGS: Emphysema and bronchitic changes. No consolidation, pleural effusion or pneumothorax. Normal cardiac size. Aortic atherosclerosis. IMPRESSION: No active disease. Emphysema and bronchitic changes. Electronically Signed   By: Jasmine Pang M.D.   On: 04/23/2023 23:24        Scheduled Meds:  arformoterol  15 mcg Nebulization BID   aspirin  81 mg Oral Daily   atorvastatin  20 mg Oral Daily   DULoxetine  90 mg Oral Daily   enoxaparin (LOVENOX) injection  40 mg Subcutaneous Q24H   guaiFENesin  600 mg Oral BID   ipratropium-albuterol  3 mL Nebulization QID   linaclotide  72 mcg Oral q morning   loratadine  10 mg Oral Daily   methylPREDNISolone (SOLU-MEDROL) injection  80 mg Intravenous Daily   multivitamin with minerals  1 tablet Oral Daily   nicotine  21 mg Transdermal Daily   oxyCODONE  30 mg Oral BID   Continuous Infusions:  azithromycin Stopped (04/24/23 0148)     LOS: 1 day       Tresa Moore, MD [facility]   If 7PM-7AM, please contact night-coverage  04/24/2023, 3:42 PM

## 2023-04-25 DIAGNOSIS — A419 Sepsis, unspecified organism: Secondary | ICD-10-CM | POA: Diagnosis not present

## 2023-04-25 DIAGNOSIS — J189 Pneumonia, unspecified organism: Secondary | ICD-10-CM | POA: Diagnosis not present

## 2023-04-25 DIAGNOSIS — J449 Chronic obstructive pulmonary disease, unspecified: Secondary | ICD-10-CM | POA: Diagnosis not present

## 2023-04-25 DIAGNOSIS — E785 Hyperlipidemia, unspecified: Secondary | ICD-10-CM | POA: Diagnosis not present

## 2023-04-25 LAB — GLUCOSE, CAPILLARY
Glucose-Capillary: 316 mg/dL — ABNORMAL HIGH (ref 70–99)
Glucose-Capillary: 260 mg/dL — ABNORMAL HIGH (ref 70–99)

## 2023-04-25 MED ORDER — INSULIN ASPART 100 UNIT/ML IJ SOLN
0.0000 [IU] | Freq: Every day | INTRAMUSCULAR | Status: DC
Start: 2023-04-25 — End: 2023-04-27
  Administered 2023-04-25: 3 [IU] via SUBCUTANEOUS
  Filled 2023-04-25 (×2): qty 1

## 2023-04-25 MED ORDER — POLYETHYLENE GLYCOL 3350 17 G PO PACK
17.0000 g | PACK | Freq: Every day | ORAL | Status: DC
  Administered 2023-04-25: 17 g via ORAL
  Filled 2023-04-25: qty 1

## 2023-04-25 MED ORDER — FUROSEMIDE 10 MG/ML IJ SOLN
40.0000 mg | Freq: Once | INTRAMUSCULAR | Status: AC
  Administered 2023-04-25: 40 mg via INTRAVENOUS
  Filled 2023-04-25: qty 4

## 2023-04-25 MED ORDER — SENNOSIDES-DOCUSATE SODIUM 8.6-50 MG PO TABS
2.0000 | ORAL_TABLET | Freq: Two times a day (BID) | ORAL | Status: DC
  Administered 2023-04-25 – 2023-04-27 (×5): 2 via ORAL
  Filled 2023-04-25 (×5): qty 2

## 2023-04-25 MED ORDER — INSULIN ASPART 100 UNIT/ML IJ SOLN
0.0000 [IU] | Freq: Three times a day (TID) | INTRAMUSCULAR | Status: DC
  Administered 2023-04-26: 2 [IU] via SUBCUTANEOUS
  Administered 2023-04-26: 1 [IU] via SUBCUTANEOUS
  Administered 2023-04-26 – 2023-04-27 (×3): 2 [IU] via SUBCUTANEOUS
  Filled 2023-04-25 (×5): qty 1

## 2023-04-25 NOTE — Progress Notes (Signed)
 PROGRESS NOTE    Laurie Berg  XBJ:[id number] DOB: 12/08/1960 DOA: 04/23/2023 PCP: Leanna Sato, MD    Brief Narrative:   63 y.o. female with medical history significant for coronary artery disease, type 2 diabetes mellitus and COPD, on home O2 at 2 L/min, who presented to the emergency room with onset of dyspnea and intermittent dry congested cough which have been worsening over the last 4 to 5 days.  She had a fever 103 here.  Pulse oximetry was 77% on room air.  She was placed on nonrebreather with pulse oximetry improving to 99%.  She admitted to cough with wheezing.  No chest pain or palpitations.  No nausea or vomiting or abdominal pain.  No dysuria, oliguria or hematuria or flank pain.  No bleeding diathesis.  The patient continues to smoke 4 to 5 cigarettes/day.   3/12: Pulmonary and palliative care consult, started bowel regimen no BM for about a week per patient   Assessment & Plan:   Principal Problem:   Sepsis due to pneumonia [facility]) Active Problems:   Dyslipidemia   GERD without esophagitis   Type 2 diabetes mellitus without complications (HCC)   IBS (irritable bowel syndrome)   COPD (chronic obstructive pulmonary disease) (HCC)  Acute on chronic hypoxic respiratory failure Acute decompensated COPD Unclear trigger.  Possible viral syndrome though COVID, flu, RSV, RVP all negative.  No infiltrate or white blood cell count to suggest bacterial pneumonia. Plan: Stop IV Solu-Medrol IV Lasix 40 mg x 1, reassess daily.  Was given another 40 mg of Lasix yesterday No fluids Continue bronchodilators Incentive spirometry, flutter Wean oxygen as tolerated, goal saturation 88-92 Pulmonary consult  Hyperlipidemia Continue statin  Possible sepsis, ruled out SIRS criteria likely driven by decompensated COPD.  No clinical indication of infection.  On empiric azithromycin.  Rocephin discontinued.  Type 2 diabetes mellitus without complication Hold home metformin.  Sliding  scale coverage for now.  GERD PPI  Constipation She reports no BM for about a week.  Will start bowel regimen  DVT prophylaxis: SQ Lovenox Code Status: Full code Family Communication: Daughters at bedside 3/12 and updated Disposition Plan: Status is: Inpatient Remains inpatient appropriate because: Acute on chronic respiratory failure, remains on 4 L oxygen via nasal cannula which is a new requirement.  Pending pulmonary consult   Level of care: Progressive  Consultants:  Pulmonary   Antimicrobials: Azithromycin   Subjective: Seen and examined.  Still short of breath.  Requiring 4 L oxygen by nasal cannula.  She would like to stay full code.  Feels constipated/reports no BM for about a week.  Both daughters at bedside.  Objective: Vitals:   04/25/23 1532 04/25/23 1600 04/25/23 1605 04/25/23 1634  BP:   122/67 116/71  Pulse:  97 97 90  Resp:  15 14 15   Temp: 98.3 F (36.8 C)   98.4 F (36.9 C)  TempSrc: Oral   Oral  SpO2:  99% 94% 93%  Weight:    76.4 kg  Height:       No intake or output data in the 24 hours ending 04/25/23 1655 Filed Weights   04/23/23 1948 04/24/23 0147 04/25/23 1634  Weight: 71.2 kg 74.8 kg 76.4 kg    Examination:  General exam: Appears calm and comfortable  Respiratory system: Diminished breath sounds.  Scattered crackles.  Mild end expiratory wheeze.  Normal work of breathing.  4 L Cardiovascular system: S1-2, RRR, no murmurs, no pedal edema Gastrointestinal system: Soft, benign Central  nervous system: Alert and oriented. No focal neurological deficits. Extremities: Symmetric 5 x 5 power. Skin: No rashes, lesions or ulcers Psychiatry: Judgement and insight appear normal. Mood & affect appropriate.     Data Reviewed: I have personally reviewed following labs and imaging studies  CBC: Recent Labs  Lab 04/23/23 2016 04/24/23 0453  WBC 10.4 9.2  NEUTROABS 9.1*  --   HGB 14.0 12.7  HCT 43.2 38.4  MCV 91.3 90.8  PLT 236 208    Basic Metabolic Panel: Recent Labs  Lab 04/23/23 2016 04/24/23 0453  NA 135 135  K 4.0 4.0  CL 101 103  CO2 23 23  GLUCOSE 129* 125*  BUN 13 10  CREATININE 0.76 0.58  CALCIUM 8.6* 8.5*   GFR: Estimated Creatinine Clearance: 73.9 mL/min (by C-G formula based on SCr of 0.58 mg/dL). Liver Function Tests: Recent Labs  Lab 04/23/23 2016  AST 23  ALT 15  ALKPHOS 61  BILITOT 0.6  PROT 6.9  ALBUMIN 3.6   No results for input(s): "LIPASE", "AMYLASE" in the last 168 hours. No results for input(s): "AMMONIA" in the last 168 hours. Coagulation Profile: Recent Labs  Lab 04/23/23 2016 04/24/23 0453  INR 1.0 1.1   Recent Labs  Lab 04/24/23 0537  GLUCAP 130*    Sepsis Labs: Recent Labs  Lab 04/23/23 2016 04/23/23 2159 04/24/23 0453  PROCALCITON  --   --  0.27  LATICACIDVEN 2.3* 1.6  --     Recent Results (from the past 240 hours)  Resp panel by RT-PCR (RSV, Flu A&B, Covid) Anterior Nasal Swab     Status: None   Collection Time: 04/23/23  8:16 PM   Specimen: Anterior Nasal Swab  Result Value Ref Range Status   SARS Coronavirus 2 by RT PCR NEGATIVE NEGATIVE Final    Comment: (NOTE) SARS-CoV-2 target nucleic acids are NOT DETECTED.  The SARS-CoV-2 RNA is generally detectable in upper respiratory specimens during the acute phase of infection. The lowest concentration of SARS-CoV-2 viral copies this assay can detect is 138 copies/mL. A negative result does not preclude SARS-Cov-2 infection and should not be used as the sole basis for treatment or other patient management decisions. A negative result may occur with  improper specimen collection/handling, submission of specimen other than nasopharyngeal swab, presence of viral mutation(s) within the areas targeted by this assay, and inadequate number of viral copies(<138 copies/mL). A negative result must be combined with clinical observations, patient history, and epidemiological information. The expected result  is Negative.  Fact Sheet for Patients:  BloggerCourse.com  Fact Sheet for Healthcare Providers:  SeriousBroker.it  This test is no t yet approved or cleared by the [place] FDA and  has been authorized for detection and/or diagnosis of SARS-CoV-2 by FDA under an Emergency Use Authorization (EUA). This EUA will remain  in effect (meaning this test can be used) for the duration of the COVID-19 declaration under Section 564(b)(1) of the Act, 21 U.S.C.section 360bbb-3(b)(1), unless the authorization is terminated  or revoked sooner.       Influenza A by PCR NEGATIVE NEGATIVE Final   Influenza B by PCR NEGATIVE NEGATIVE Final    Comment: (NOTE) The Xpert Xpress SARS-CoV-2/FLU/RSV plus assay is intended as an aid in the diagnosis of influenza from Nasopharyngeal swab specimens and should not be used as a sole basis for treatment. Nasal washings and aspirates are unacceptable for Xpert Xpress SARS-CoV-2/FLU/RSV testing.  Fact Sheet for Patients: BloggerCourse.com  Fact Sheet for Healthcare Providers:  SeriousBroker.it  This test is not yet approved or cleared by the [place] and has been authorized for detection and/or diagnosis of SARS-CoV-2 by FDA under an Emergency Use Authorization (EUA). This EUA will remain in effect (meaning this test can be used) for the duration of the COVID-19 declaration under Section 564(b)(1) of the Act, 21 U.S.C. section 360bbb-3(b)(1), unless the authorization is terminated or revoked.     Resp Syncytial Virus by PCR NEGATIVE NEGATIVE Final    Comment: (NOTE) Fact Sheet for Patients: BloggerCourse.com  Fact Sheet for Healthcare Providers: SeriousBroker.it  This test is not yet approved or cleared by the [place] FDA and has been authorized for detection and/or diagnosis of  SARS-CoV-2 by FDA under an Emergency Use Authorization (EUA). This EUA will remain in effect (meaning this test can be used) for the duration of the COVID-19 declaration under Section 564(b)(1) of the Act, 21 U.S.C. section 360bbb-3(b)(1), unless the authorization is terminated or revoked.  Performed at [facility], [address] [place], [place] [address]   Blood Culture (routine x 2)     Status: None (Preliminary result)   Collection Time: 04/23/23  8:16 PM   Specimen: BLOOD  Result Value Ref Range Status   Specimen Description BLOOD LEFT ANTECUBITAL  Final   Special Requests   Final    BOTTLES DRAWN AEROBIC AND ANAEROBIC Blood Culture results may not be optimal due to an inadequate volume of blood received in culture bottles   Culture   Final    NO GROWTH 2 DAYS Performed at [facility], [address] [place], [place] [address]    Report Status PENDING  Incomplete  Blood Culture (routine x 2)     Status: None (Preliminary result)   Collection Time: 04/23/23  8:21 PM   Specimen: BLOOD  Result Value Ref Range Status   Specimen Description BLOOD RIGHT ANTECUBITAL  Final   Special Requests   Final    BOTTLES DRAWN AEROBIC AND ANAEROBIC Blood Culture adequate volume   Culture   Final    NO GROWTH 2 DAYS Performed at [facility], [address] [place], [place] [address]    Report Status PENDING  Incomplete  Respiratory (~20 pathogens) panel by PCR     Status: None   Collection Time: 04/24/23  8:25 AM   Specimen: Nasopharyngeal Swab; Respiratory  Result Value Ref Range Status   Adenovirus NOT DETECTED NOT DETECTED Final   Coronavirus 229E NOT DETECTED NOT DETECTED Final    Comment: (NOTE) The Coronavirus on the Respiratory Panel, DOES NOT test for the novel  Coronavirus (2019 nCoV)    Coronavirus HKU1 NOT DETECTED NOT DETECTED Final   Coronavirus NL63 NOT DETECTED NOT DETECTED Final   Coronavirus OC43 NOT DETECTED NOT DETECTED Final    Metapneumovirus NOT DETECTED NOT DETECTED Final   Rhinovirus / Enterovirus NOT DETECTED NOT DETECTED Final   Influenza A NOT DETECTED NOT DETECTED Final   Influenza B NOT DETECTED NOT DETECTED Final   Parainfluenza Virus 1 NOT DETECTED NOT DETECTED Final   Parainfluenza Virus 2 NOT DETECTED NOT DETECTED Final   Parainfluenza Virus 3 NOT DETECTED NOT DETECTED Final   Parainfluenza Virus 4 NOT DETECTED NOT DETECTED Final   Respiratory Syncytial Virus NOT DETECTED NOT DETECTED Final   Bordetella pertussis NOT DETECTED NOT DETECTED Final   Bordetella Parapertussis NOT DETECTED NOT DETECTED Final   Chlamydophila pneumoniae NOT DETECTED NOT DETECTED Final   Mycoplasma pneumoniae NOT DETECTED  NOT DETECTED Final    Comment: Performed at [facility], [address] [place], [place] [address]         Radiology Studies: CT HEAD WO CONTRAST ( ) Result Date: 04/24/2023 CLINICAL DATA:  Mental status change. EXAM: CT HEAD WITHOUT CONTRAST TECHNIQUE: Contiguous axial images were obtained from the base of the skull through the vertex without intravenous contrast. RADIATION DOSE REDUCTION: This exam was performed according to the departmental dose-optimization program which includes automated exposure control, adjustment of the mA and/or kV according to patient size and/or use of iterative reconstruction technique. COMPARISON:  None Available. FINDINGS: Brain: No acute intracranial hemorrhage. No CT evidence of acute infarct. Nonspecific hypoattenuation in the periventricular and subcortical white matter favored to reflect chronic microvascular ischemic changes. No edema, mass effect, or midline shift. The basilar cisterns are patent. Ventricles: The ventricles are normal. Vascular: Atherosclerotic calcifications of the carotid siphons and intracranial vertebral arteries. No hyperdense vessel. Skull: No acute or aggressive finding. Orbits: Orbits are symmetric. Sinuses: The visualized paranasal sinuses  are clear. Other: Mastoid air cells are clear. IMPRESSION: 1. No acute intracranial abnormality. 2. Moderate chronic microvascular ischemic changes. Electronically Signed   By: Emily Filbert M.D.   On: 04/24/2023 10:38   DG Chest Port 1 View Result Date: 04/23/2023 CLINICAL DATA:  Possible sepsis EXAM: PORTABLE CHEST 1 VIEW COMPARISON:  03/21/2022 FINDINGS: Emphysema and bronchitic changes. No consolidation, pleural effusion or pneumothorax. Normal cardiac size. Aortic atherosclerosis. IMPRESSION: No active disease. Emphysema and bronchitic changes. Electronically Signed   By: Jasmine Pang M.D.   On: 04/23/2023 23:24        Scheduled Meds:  arformoterol  15 mcg Nebulization BID   aspirin  81 mg Oral Daily   atorvastatin  20 mg Oral Daily   DULoxetine  90 mg Oral Daily   enoxaparin (LOVENOX) injection  40 mg Subcutaneous Q24H   guaiFENesin  600 mg Oral BID   ipratropium-albuterol  3 mL Nebulization QID   linaclotide  72 mcg Oral q morning   loratadine  10 mg Oral Daily   multivitamin with minerals  1 tablet Oral Daily   nicotine  21 mg Transdermal Daily   oxyCODONE  30 mg Oral BID   polyethylene glycol  17 g Oral Daily   senna-docusate  2 tablet Oral BID   Continuous Infusions:  azithromycin Stopped (04/25/23 0146)     LOS: 2 days   Time spent 35 minutes    Delfino Lovett, MD [facility]   If 7PM-7AM, please contact night-coverage  04/25/2023, 4:55 PM

## 2023-04-25 NOTE — ED Notes (Signed)
 Pt up to bs commode pt requests new linen new gown and monitor replacement pt appears in nad speaking in full clear sentences family remains seated in recliners in room x3

## 2023-04-25 NOTE — ED Notes (Signed)
 Pt provided undergarments per request pt remains on 4L O2 pt family updated

## 2023-04-25 NOTE — ED Notes (Signed)
 Provided cold drink pt aox4 speaking in full clear sentences family @ bs appears in nad  watching television

## 2023-04-25 NOTE — ED Notes (Addendum)
 Dr Sherryll Burger notified and aware of pt complaining of a headache and new abdominal pain

## 2023-04-25 NOTE — ED Notes (Addendum)
 This RN secured chat Dr Sherryll Burger,  "Good morning Dr Sherryll Burger, pts family were asking if you could review the DNR information with the pt again? They said when the Dr first spoke with the pt she was somewhat altered and they were not sure if she fully understood everything. They were also asking if you could review the plan of care with them. "

## 2023-04-25 NOTE — ED Notes (Signed)
 Transport arrives for transport pt to [facility] ICU pt family @ bs updated pt hr 113 Bp 110/59 108 O2 97% 20rr

## 2023-04-26 DIAGNOSIS — J189 Pneumonia, unspecified organism: Secondary | ICD-10-CM | POA: Diagnosis not present

## 2023-04-26 DIAGNOSIS — A419 Sepsis, unspecified organism: Secondary | ICD-10-CM | POA: Diagnosis not present

## 2023-04-26 DIAGNOSIS — J449 Chronic obstructive pulmonary disease, unspecified: Secondary | ICD-10-CM | POA: Diagnosis not present

## 2023-04-26 DIAGNOSIS — E785 Hyperlipidemia, unspecified: Secondary | ICD-10-CM | POA: Diagnosis not present

## 2023-04-26 LAB — CBC
HCT: 36.7 % (ref 36.0–46.0)
Hemoglobin: 11.9 g/dL — ABNORMAL LOW (ref 12.0–15.0)
MCH: 28.9 pg (ref 26.0–34.0)
MCHC: 32.4 g/dL (ref 30.0–36.0)
MCV: 89.1 fL (ref 80.0–100.0)
Platelets: 226 10*3/uL (ref 150–400)
RBC: 4.12 MIL/uL (ref 3.87–5.11)
RDW: 15.9 % — ABNORMAL HIGH (ref 11.5–15.5)
WBC: 10.5 10*3/uL (ref 4.0–10.5)
nRBC: 0 % (ref 0.0–0.2)

## 2023-04-26 LAB — BASIC METABOLIC PANEL
Anion gap: 9 (ref 5–15)
BUN: 16 mg/dL (ref 8–23)
CO2: 28 mmol/L (ref 22–32)
Calcium: 8.5 mg/dL — ABNORMAL LOW (ref 8.9–10.3)
Chloride: 98 mmol/L (ref 98–111)
Creatinine, Ser: 0.72 mg/dL (ref 0.44–1.00)
GFR, Estimated: >60 mL/min (ref >60)
Glucose, Bld: 89 mg/dL (ref 70–99)
Potassium: 4.3 mmol/L (ref 3.5–5.1)
Sodium: 135 mmol/L (ref 135–145)

## 2023-04-26 LAB — GLUCOSE, CAPILLARY
Glucose-Capillary: 158 mg/dL — ABNORMAL HIGH (ref 70–99)
Glucose-Capillary: 131 mg/dL — ABNORMAL HIGH (ref 70–99)
Glucose-Capillary: 176 mg/dL — ABNORMAL HIGH (ref 70–99)
Glucose-Capillary: 196 mg/dL — ABNORMAL HIGH (ref 70–99)

## 2023-04-26 LAB — MRSA NEXT GEN BY PCR, NASAL: MRSA by PCR Next Gen: NOT DETECTED

## 2023-04-26 LAB — HEMOGLOBIN A1C
Hgb A1c MFr Bld: 6.7 % — ABNORMAL HIGH (ref 4.8–5.6)
Mean Plasma Glucose: 145.59 mg/dL

## 2023-04-26 LAB — C-REACTIVE PROTEIN: CRP: 10.7 mg/dL — ABNORMAL HIGH (ref <1.0)

## 2023-04-26 MED ORDER — POLYETHYLENE GLYCOL 3350 17 G PO PACK
17.0000 g | PACK | Freq: Two times a day (BID) | ORAL | Status: DC
  Administered 2023-04-26 – 2023-04-27 (×3): 17 g via ORAL
  Filled 2023-04-26 (×3): qty 1

## 2023-04-26 MED ORDER — METHYLPREDNISOLONE SODIUM SUCC 40 MG IJ SOLR
40.0000 mg | Freq: Two times a day (BID) | INTRAMUSCULAR | Status: DC
  Administered 2023-04-26 (×2): 40 mg via INTRAVENOUS
  Filled 2023-04-26 (×2): qty 1

## 2023-04-26 MED ORDER — SALINE SPRAY 0.65 % NA SOLN
1.0000 | NASAL | Status: DC | PRN
  Administered 2023-04-26: 1 via NASAL
  Filled 2023-04-26: qty 44

## 2023-04-26 MED ORDER — FUROSEMIDE 10 MG/ML IJ SOLN
40.0000 mg | Freq: Once | INTRAMUSCULAR | Status: AC
  Administered 2023-04-26: 40 mg via INTRAVENOUS
  Filled 2023-04-26: qty 4

## 2023-04-26 MED ORDER — DOXYCYCLINE HYCLATE 100 MG PO TABS
100.0000 mg | ORAL_TABLET | Freq: Two times a day (BID) | ORAL | Status: DC
  Administered 2023-04-26 – 2023-04-27 (×3): 100 mg via ORAL
  Filled 2023-04-26 (×3): qty 1

## 2023-04-26 NOTE — Consult Note (Signed)
 PULMONOLOGY         Date: 04/26/2023,   MRN# [id number] Laurie Berg 1960-06-20     AdmissionWeight: 71.2 kg                 CurrentWeight: 76.3 kg  Referring provider: Dr Sherryll Burger   CHIEF COMPLAINT:   Acute on chronic hypoxemic respiratory failure   HISTORY OF PRESENT ILLNESS   This is a 63 yo F with history of lifelong smoking, COPD with chronic hypoxemia on home Trelegy once daily inhaler, previous bouts of COVID19, CAD, lumbago and cervical radiculopathy, DM2, lung nodules who came in with complaints of wheezing and dyspnea x 4 days with increased O2 requirement. She generally uses 2L/min [place] supplemental O2 at home but noted oxygen was dropping in the mid 70s.  She does continue to smoke and we discussed smoking cessation in the past. She was seen in ER with labored breathing, tachypnea, tachycardia and required 6L/min [place] to reach normoxia.  She had chest imaging performed noted to have bronchitic changes suggestive of COPD exacerbation but absence of florid pneumonia.  She does report onset of pedal edema recently.  She was placed on empiric antibiotics with vanco/cefepime/flagyl and started on LR for IVF. She had RVP which was negative.  She had CT head which was normal besides age related microvascular disease.    PAST MEDICAL HISTORY   Past Medical History:  Diagnosis Date   Cervical radiculopathy    Chronic pain syndrome    Colon polyps    COPD (chronic obstructive pulmonary disease) (HCC)    Coronary artery calcification seen on CT scan    a. 04/2021 High Res CT Chest: Ao atherosclerosis, cor Ca2+; b. 07/2021 MV: EF 69%, no isch/infarct, LAD/LCX Ca2+ noted; c. 02/2022 CT Chest: Cor and Ao atherosclerosis.   COVID-19 virus infection    Diabetes mellitus without complication (HCC)    History of echocardiogram    a. 08/2021 Echo: EF 55-60%, no rwma, nl RV fxn.   Lung nodule    a. 02/2022 CT Chest: RLL 7mm solid subpleural pulm nodule, prev 21mm-->rec thoracic  surgery eval.   Sepsis (HCC)    Tobacco abuse      SURGICAL HISTORY   Past Surgical History:  Procedure Laterality Date   CARPAL TUNNEL RELEASE     COLONOSCOPY WITH PROPOFOL N/A 10/21/2019   Procedure: COLONOSCOPY WITH PROPOFOL;  Surgeon: Pasty Spillers, MD;  Location: [facility] ENDOSCOPY;  Service: Endoscopy;  Laterality: N/A;   DILATION AND CURETTAGE OF UTERUS     TUBAL LIGATION       FAMILY HISTORY   History reviewed. No pertinent family history.   SOCIAL HISTORY   Social History   Tobacco Use   Smoking status: Every Day    Current packs/day: 1.75    Average packs/day: 1.8 packs/day for 48.0 years (84.0 ttl pk-yrs)    Types: Cigarettes   Smokeless tobacco: Never  Vaping Use   Vaping status: Never Used  Substance Use Topics   Alcohol use: Not Currently   Drug use: Never     MEDICATIONS    Home Medication:    Current Medication:  Current Facility-Administered Medications:    acetaminophen (TYLENOL) tablet 650 mg, 650 mg, Oral, Q6H PRN, 650 mg at 04/25/23 8657 **OR** acetaminophen (TYLENOL) suppository 650 mg, 650 mg, Rectal, Q6H PRN, Mansy, Jan A, MD   arformoterol (BROVANA) nebulizer solution 15 mcg, 15 mcg, Nebulization, BID, Sreenath, Sudheer B, MD, 15 mcg at  04/26/23 0736   aspirin chewable tablet 81 mg, 81 mg, Oral, Daily, Mansy, Jan A, MD, 81 mg at 04/25/23 1011   atorvastatin (LIPITOR) tablet 20 mg, 20 mg, Oral, Daily, Mansy, Jan A, MD, 20 mg at 04/24/23 0144   azithromycin (ZITHROMAX) 500 mg in sodium chloride 0.9 % 250 mL IVPB, 500 mg, Intravenous, Q24H, Mansy, Jan A, MD, Last Rate: 250 mL/hr at 04/25/23 2315, 500 mg at 04/25/23 2315   chlorpheniramine-HYDROcodone (TUSSIONEX) 10-8 MG/5ML suspension 5 mL, 5 mL, Oral, Q12H PRN, Mansy, Jan A, MD   cyclobenzaprine (FLEXERIL) tablet 10 mg, 10 mg, Oral, BID PRN, Mansy, Jan A, MD, 10 mg at 04/25/23 4098   DULoxetine (CYMBALTA) DR capsule 90 mg, 90 mg, Oral, Daily, Mansy, Jan A, MD, 90 mg at 04/25/23 1010    enoxaparin (LOVENOX) injection 40 mg, 40 mg, Subcutaneous, Q24H, Mansy, Jan A, MD, 40 mg at 04/25/23 1000   guaiFENesin (MUCINEX) 12 hr tablet 600 mg, 600 mg, Oral, BID, Mansy, Jan A, MD, 600 mg at 04/25/23 2254   insulin aspart (novoLOG) injection 0-5 Units, 0-5 Units, Subcutaneous, QHS, Delfino Lovett, MD, 3 Units at 04/25/23 2311   insulin aspart (novoLOG) injection 0-9 Units, 0-9 Units, Subcutaneous, TID WC, Sherryll Burger, Vipul, MD   ipratropium-albuterol (DUONEB) 0.5-2.5 (3) MG/3ML nebulizer solution 3 mL, 3 mL, Nebulization, QID, Mansy, Jan A, MD, 3 mL at 04/26/23 1191   linaclotide (LINZESS) capsule 72 mcg, 72 mcg, Oral, q morning, Mansy, Jan A, MD, 72 mcg at 04/25/23 1010   loratadine (CLARITIN) tablet 10 mg, 10 mg, Oral, Daily, Mansy, Jan A, MD, 10 mg at 04/25/23 1011   magnesium hydroxide (MILK OF MAGNESIA) suspension 30 mL, 30 mL, Oral, Daily PRN, Mansy, Jan A, MD   multivitamin with minerals tablet 1 tablet, 1 tablet, Oral, Daily, Mansy, Jan A, MD, 1 tablet at 04/25/23 1011   naloxone (NARCAN) nasal spray 4 mg/0.1 mL, 1 spray, Nasal, Once PRN, Mansy, Jan A, MD   nicotine (NICODERM CQ - dosed in mg/24 hours) patch 21 mg, 21 mg, Transdermal, Daily, Mansy, Jan A, MD, 21 mg at 04/25/23 1006   ondansetron (ZOFRAN) tablet 4 mg, 4 mg, Oral, Q6H PRN **OR** ondansetron (ZOFRAN) injection 4 mg, 4 mg, Intravenous, Q6H PRN, Mansy, Jan A, MD   oxyCODONE (OXYCONTIN) 12 hr tablet 30 mg, 30 mg, Oral, BID, Mansy, Jan A, MD, 30 mg at 04/25/23 2254   oxyCODONE-acetaminophen (PERCOCET/ROXICET) 5-325 MG per tablet 1 tablet, 1 tablet, Oral, Q6H PRN, Mansy, Jan A, MD, 1 tablet at 04/24/23 1656   polyethylene glycol (MIRALAX / GLYCOLAX) packet 17 g, 17 g, Oral, BID, Delfino Lovett, MD   senna-docusate (Senokot-S) tablet 2 tablet, 2 tablet, Oral, BID, Delfino Lovett, MD, 2 tablet at 04/25/23 2254   sodium chloride (OCEAN) 0.65 % nasal spray 1 spray, 1 spray, Each Nare, PRN, Delfino Lovett, MD, 1 spray at 04/26/23 0420   traZODone  (DESYREL) tablet 25 mg, 25 mg, Oral, QHS PRN, Mansy, Jan A, MD    ALLERGIES   Pregabalin and Sulfamethoxazole-trimethoprim     REVIEW OF SYSTEMS    Review of Systems:  Gen:  Denies  fever, sweats, chills weigh loss  HEENT: Denies blurred vision, double vision, ear pain, eye pain, hearing loss, nose bleeds, sore throat Cardiac:  No dizziness, chest pain or heaviness, chest tightness,edema Resp:   reports dyspnea chronically  Gi: Denies swallowing difficulty, stomach pain, nausea or vomiting, diarrhea, constipation, bowel incontinence Gu:  Denies bladder incontinence, burning urine  Ext:   Denies Joint pain, stiffness or swelling Skin: Denies  skin rash, easy bruising or bleeding or hives Endoc:  Denies polyuria, polydipsia , polyphagia or weight change Psych:   Denies depression, insomnia or hallucinations   Other:  All other systems negative   VS: BP 135/80 (BP Location: Right Arm)   Pulse 85   Temp 98 F (36.7 C)   Resp 20   Ht 5\' 4"  (1.626 m)   Wt 76.3 kg   SpO2 93%   BMI 28.89 kg/m      PHYSICAL EXAM    GENERAL:NAD, no fevers, chills, no weakness no fatigue HEAD: Normocephalic, atraumatic.  EYES: Pupils equal, round, reactive to light. Extraocular muscles intact. No scleral icterus.  MOUTH: Moist mucosal membrane. Dentition intact. No abscess noted.  EAR, NOSE, THROAT: Clear without exudates. No external lesions.  NECK: Supple. No thyromegaly. No nodules. No JVD.  PULMONARY: decreased breath sounds with mild rhonchi worse at bases bilaterally.  CARDIOVASCULAR: S1 and S2. Regular rate and rhythm. No murmurs, rubs, or gallops. No edema. Pedal pulses 2+ bilaterally.  GASTROINTESTINAL: Soft, nontender, nondistended. No masses. Positive bowel sounds. No hepatosplenomegaly.  MUSCULOSKELETAL: No swelling, clubbing, or edema. Range of motion full in all extremities.  NEUROLOGIC: Cranial nerves II through XII are intact. No gross focal neurological deficits. Sensation  intact. Reflexes intact.  SKIN: No ulceration, lesions, rashes, or cyanosis. Skin warm and dry. Turgor intact.  PSYCHIATRIC: Mood, affect within normal limits. The patient is awake, alert and oriented x 3. Insight, judgment intact.       IMAGING     ASSESSMENT/PLAN   Acute on chronic hypoxemic respiratory failure   - thus far infectious workup is negative   - will narrow antimicrobial regimen to doxycycline BID   - CRP trend    - continue steroids with tapering protocol    - nebulizer therapy per COPD carepath    -CXR above with hazy opacification of RLL but clear costophrenic and cardiophrenic angle.    - patient received 80mg  solumedrol yesterday and is still wheezing bilatearlly , will administer 40 IV BID today and continue tapering   - no need for pulmicort nebulizer therapy     Bibasilar atelectasis     - chest physiotherapy with incentive spirometry and flutter valve   - PT/OT when able     Tobacco dependence    - nicotine replacement has been provided   Allergic rhinosinusitis   - continue [place] nasal spary    Insomnia     Trazadone QHS      Thank you for allowing me to participate in the care of this patient.   Patient/Family are satisfied with care plan and all questions have been answered.    Provider disclosure: Patient with at least one acute or chronic illness or injury that poses a threat to life or bodily function and is being managed actively during this encounter.  All of the below services have been performed independently by signing provider:  review of prior documentation from internal and or external health records.  Review of previous and current lab results.  Interview and comprehensive assessment during patient visit today. Review of current and previous chest radiographs/CT scans. Discussion of management and test interpretation with health care team and patient/family.   This document was prepared using Dragon voice recognition software  and may include unintentional dictation errors.     Vida Rigger, M.D.  Division of Pulmonary & Critical Care Medicine

## 2023-04-26 NOTE — Progress Notes (Signed)
 PROGRESS NOTE    Laurie Berg  ZOX:[id number] DOB: 1960/07/08 DOA: 04/23/2023 PCP: Leanna Sato, MD    Brief Narrative:   63 y.o. female with medical history significant for coronary artery disease, type 2 diabetes mellitus and COPD, on home O2 at 2 L/min, who presented to the emergency room with onset of dyspnea and intermittent dry congested cough which have been worsening over the last 4 to 5 days.  She had a fever 103 here.  Pulse oximetry was 77% on room air.  She was placed on nonrebreather with pulse oximetry improving to 99%.  She admitted to cough with wheezing.  No chest pain or palpitations.  No nausea or vomiting or abdominal pain.  No dysuria, oliguria or hematuria or flank pain.  No bleeding diathesis.  The patient continues to smoke 4 to 5 cigarettes/day.   3/12: Pulmonary and palliative care consult, started bowel regimen no BM for about a week per patient 3/13: PT and OT eval.  Discontinue telemetry   Assessment & Plan:   Principal Problem:   Sepsis due to pneumonia [facility]) Active Problems:   Dyslipidemia   GERD without esophagitis   Type 2 diabetes mellitus without complications (HCC)   IBS (irritable bowel syndrome)   COPD (chronic obstructive pulmonary disease) (HCC)  Acute on chronic hypoxic respiratory failure Acute decompensated COPD Unclear trigger.  Possible viral syndrome though COVID, flu, RSV, RVP all negative.  No infiltrate or white blood cell count to suggest bacterial pneumonia. Plan: Continue IV Solu-Medrol 40 mg twice daily Added doxycycline by PCCM IV Lasix 40 mg x 1 No fluids.  Strict I's and O's Continue bronchodilators Incentive spirometry, flutter Wean oxygen as tolerated, goal saturation 88-92.  Currently on 4 L oxygen via nasal cannula Pulmonary input appreciated  Hyperlipidemia Continue statin  Possible sepsis, ruled out SIRS criteria likely driven by decompensated COPD.  No clinical indication of infection.  On empiric  azithromycin.  Rocephin discontinued.  Type 2 diabetes mellitus without complication Hold home metformin.  Sliding scale coverage for now.  GERD PPI  Constipation She reports no BM for about a week.  Continue bowel regimen  DVT prophylaxis: SQ Lovenox Code Status: Full code Family Communication: Daughters at bedside and updated Disposition Plan: Status is: Inpatient Remains inpatient appropriate because: Acute on chronic respiratory failure, remains on 4 L oxygen via nasal cannula which is a new requirement.     Level of care: Med-Surg  Consultants:  Pulmonary   Antimicrobials: Doxycycline   Subjective:  Remains short of breath.  Was able to sleep after long time per patient and seems relaxed.  Both daughters at bedside.  Patient is still on 4 L oxygen via nasal cannula  Objective: Vitals:   04/26/23 0432 04/26/23 0608 04/26/23 0849 04/26/23 1221  BP: 135/80  108/85 (!) 157/92  Pulse: 85  94 94  Resp: 20  16 16   Temp: 98 F (36.7 C)  97.7 F (36.5 C) 98 F (36.7 C)  TempSrc:      SpO2: 93%  91% (!) 87%  Weight:  76.3 kg    Height:        Intake/Output Summary (Last 24 hours) at 04/26/2023 1542 Last data filed at 04/26/2023 1125 Gross per 24 hour  Intake 240 ml  Output --  Net 240 ml   Filed Weights   04/24/23 0147 04/25/23 1634 04/26/23 0608  Weight: 74.8 kg 76.4 kg 76.3 kg    Examination:  General exam: Appears calm and  comfortable  Respiratory system: Diminished breath sounds.  Scattered crackles.  Mild end expiratory wheeze.  Normal work of breathing.  4 L Cardiovascular system: S1-2, RRR, no murmurs, no pedal edema Gastrointestinal system: Soft, benign Central nervous system: Alert and oriented. No focal neurological deficits. Extremities: Symmetric 5 x 5 power. Skin: No rashes, lesions or ulcers Psychiatry: Judgement and insight appear normal. Mood & affect appropriate.     Data Reviewed: I have personally reviewed following labs and imaging  studies  CBC: Recent Labs  Lab 04/23/23 2016 04/24/23 0453 04/26/23 0441  WBC 10.4 9.2 10.5  NEUTROABS 9.1*  --   --   HGB 14.0 12.7 11.9*  HCT 43.2 38.4 36.7  MCV 91.3 90.8 89.1  PLT 236 208 226   Basic Metabolic Panel: Recent Labs  Lab 04/23/23 2016 04/24/23 0453 04/26/23 0441  NA 135 135 135  K 4.0 4.0 4.3  CL 101 103 98  CO2 23 23 28   GLUCOSE 129* 125* 89  BUN 13 10 16   CREATININE 0.76 0.58 0.72  CALCIUM 8.6* 8.5* 8.5*   GFR: Estimated Creatinine Clearance: 73.8 mL/min (by C-G formula based on SCr of 0.72 mg/dL). Liver Function Tests: Recent Labs  Lab 04/23/23 2016  AST 23  ALT 15  ALKPHOS 61  BILITOT 0.6  PROT 6.9  ALBUMIN 3.6   No results for input(s): "LIPASE", "AMYLASE" in the last 168 hours. No results for input(s): "AMMONIA" in the last 168 hours. Coagulation Profile: Recent Labs  Lab 04/23/23 2016 04/24/23 0453  INR 1.0 1.1   Recent Labs  Lab 04/24/23 0537 04/25/23 2035 04/25/23 2252 04/26/23 0851 04/26/23 1221  GLUCAP 130* 316* 260* 158* 131*    Sepsis Labs: Recent Labs  Lab 04/23/23 2016 04/23/23 2159 04/24/23 0453  PROCALCITON  --   --  0.27  LATICACIDVEN 2.3* 1.6  --     Recent Results (from the past 240 hours)  Resp panel by RT-PCR (RSV, Flu A&B, Covid) Anterior Nasal Swab     Status: None   Collection Time: 04/23/23  8:16 PM   Specimen: Anterior Nasal Swab  Result Value Ref Range Status   SARS Coronavirus 2 by RT PCR NEGATIVE NEGATIVE Final    Comment: (NOTE) SARS-CoV-2 target nucleic acids are NOT DETECTED.  The SARS-CoV-2 RNA is generally detectable in upper respiratory specimens during the acute phase of infection. The lowest concentration of SARS-CoV-2 viral copies this assay can detect is 138 copies/mL. A negative result does not preclude SARS-Cov-2 infection and should not be used as the sole basis for treatment or other patient management decisions. A negative result may occur with  improper specimen  collection/handling, submission of specimen other than nasopharyngeal swab, presence of viral mutation(s) within the areas targeted by this assay, and inadequate number of viral copies(<138 copies/mL). A negative result must be combined with clinical observations, patient history, and epidemiological information. The expected result is Negative.  Fact Sheet for Patients:  BloggerCourse.com  Fact Sheet for Healthcare Providers:  SeriousBroker.it  This test is no t yet approved or cleared by the [place] FDA and  has been authorized for detection and/or diagnosis of SARS-CoV-2 by FDA under an Emergency Use Authorization (EUA). This EUA will remain  in effect (meaning this test can be used) for the duration of the COVID-19 declaration under Section 564(b)(1) of the Act, 21 U.S.C.section 360bbb-3(b)(1), unless the authorization is terminated  or revoked sooner.       Influenza A by PCR NEGATIVE NEGATIVE  Final   Influenza B by PCR NEGATIVE NEGATIVE Final    Comment: (NOTE) The Xpert Xpress SARS-CoV-2/FLU/RSV plus assay is intended as an aid in the diagnosis of influenza from Nasopharyngeal swab specimens and should not be used as a sole basis for treatment. Nasal washings and aspirates are unacceptable for Xpert Xpress SARS-CoV-2/FLU/RSV testing.  Fact Sheet for Patients: BloggerCourse.com  Fact Sheet for Healthcare Providers: SeriousBroker.it  This test is not yet approved or cleared by the [place] FDA and has been authorized for detection and/or diagnosis of SARS-CoV-2 by FDA under an Emergency Use Authorization (EUA). This EUA will remain in effect (meaning this test can be used) for the duration of the COVID-19 declaration under Section 564(b)(1) of the Act, 21 U.S.C. section 360bbb-3(b)(1), unless the authorization is terminated or revoked.     Resp Syncytial  Virus by PCR NEGATIVE NEGATIVE Final    Comment: (NOTE) Fact Sheet for Patients: BloggerCourse.com  Fact Sheet for Healthcare Providers: SeriousBroker.it  This test is not yet approved or cleared by the [place] FDA and has been authorized for detection and/or diagnosis of SARS-CoV-2 by FDA under an Emergency Use Authorization (EUA). This EUA will remain in effect (meaning this test can be used) for the duration of the COVID-19 declaration under Section 564(b)(1) of the Act, 21 U.S.C. section 360bbb-3(b)(1), unless the authorization is terminated or revoked.  Performed at [facility], [address] [place], [place] [address]   Blood Culture (routine x 2)     Status: None (Preliminary result)   Collection Time: 04/23/23  8:16 PM   Specimen: BLOOD  Result Value Ref Range Status   Specimen Description BLOOD LEFT ANTECUBITAL  Final   Special Requests   Final    BOTTLES DRAWN AEROBIC AND ANAEROBIC Blood Culture results may not be optimal due to an inadequate volume of blood received in culture bottles   Culture   Final    NO GROWTH 3 DAYS Performed at [facility], [address] [place], [place] [address]    Report Status PENDING  Incomplete  Blood Culture (routine x 2)     Status: None (Preliminary result)   Collection Time: 04/23/23  8:21 PM   Specimen: BLOOD  Result Value Ref Range Status   Specimen Description BLOOD RIGHT ANTECUBITAL  Final   Special Requests   Final    BOTTLES DRAWN AEROBIC AND ANAEROBIC Blood Culture adequate volume   Culture   Final    NO GROWTH 3 DAYS Performed at [facility], [address] [place], [place] [address]    Report Status PENDING  Incomplete  Respiratory (~20 pathogens) panel by PCR     Status: None   Collection Time: 04/24/23  8:25 AM   Specimen: Nasopharyngeal Swab; Respiratory  Result Value Ref Range Status   Adenovirus NOT DETECTED NOT DETECTED  Final   Coronavirus 229E NOT DETECTED NOT DETECTED Final    Comment: (NOTE) The Coronavirus on the Respiratory Panel, DOES NOT test for the novel  Coronavirus (2019 nCoV)    Coronavirus HKU1 NOT DETECTED NOT DETECTED Final   Coronavirus NL63 NOT DETECTED NOT DETECTED Final   Coronavirus OC43 NOT DETECTED NOT DETECTED Final   Metapneumovirus NOT DETECTED NOT DETECTED Final   Rhinovirus / Enterovirus NOT DETECTED NOT DETECTED Final   Influenza A NOT DETECTED NOT DETECTED Final   Influenza B NOT DETECTED NOT DETECTED Final   Parainfluenza Virus 1 NOT DETECTED NOT DETECTED Final   Parainfluenza Virus  2 NOT DETECTED NOT DETECTED Final   Parainfluenza Virus 3 NOT DETECTED NOT DETECTED Final   Parainfluenza Virus 4 NOT DETECTED NOT DETECTED Final   Respiratory Syncytial Virus NOT DETECTED NOT DETECTED Final   Bordetella pertussis NOT DETECTED NOT DETECTED Final   Bordetella Parapertussis NOT DETECTED NOT DETECTED Final   Chlamydophila pneumoniae NOT DETECTED NOT DETECTED Final   Mycoplasma pneumoniae NOT DETECTED NOT DETECTED Final    Comment: Performed at [facility], [address], [place], [place] [address]  MRSA Next Gen by PCR, Nasal     Status: None   Collection Time: 04/26/23  9:00 AM   Specimen: Nasal Mucosa; Nasal Swab  Result Value Ref Range Status   MRSA by PCR Next Gen NOT DETECTED NOT DETECTED Final    Comment: (NOTE) The GeneXpert MRSA Assay (FDA approved for NASAL specimens only), is one component of a comprehensive MRSA colonization surveillance program. It is not intended to diagnose MRSA infection nor to guide or monitor treatment for MRSA infections. Test performance is not FDA approved in patients less than 44 years old. Performed at [facility], [address], [place], [place] [address]          Radiology Studies: No results found.       Scheduled Meds:  arformoterol  15 mcg Nebulization BID   aspirin  81 mg Oral Daily    atorvastatin  20 mg Oral Daily   doxycycline  100 mg Oral Q12H   DULoxetine  90 mg Oral Daily   enoxaparin (LOVENOX) injection  40 mg Subcutaneous Q24H   guaiFENesin  600 mg Oral BID   insulin aspart  0-5 Units Subcutaneous QHS   insulin aspart  0-9 Units Subcutaneous TID WC   ipratropium-albuterol  3 mL Nebulization QID   linaclotide  72 mcg Oral q morning   loratadine  10 mg Oral Daily   methylPREDNISolone (SOLU-MEDROL) injection  40 mg Intravenous Q12H   multivitamin with minerals  1 tablet Oral Daily   nicotine  21 mg Transdermal Daily   oxyCODONE  30 mg Oral BID   polyethylene glycol  17 g Oral BID   senna-docusate  2 tablet Oral BID   Continuous Infusions:     LOS: 3 days   Time spent 35 minutes    Delfino Lovett, MD [facility]   If 7PM-7AM, please contact night-coverage  04/26/2023, 3:42 PM

## 2023-04-26 NOTE — Care Management Important Message (Signed)
 Important Message  Patient Details  Name: Laurie Berg MRN: [id number] Date of Birth: 21-Feb-1960   Important Message Given:  Yes - Medicare IM     Karmin Kasprzak W, CMA 04/26/2023, 10:26 AM

## 2023-04-26 NOTE — Plan of Care (Signed)

## 2023-04-26 NOTE — Plan of Care (Signed)
   Problem: Respiratory: Goal: Ability to maintain adequate ventilation will improve Outcome: Progressing

## 2023-04-26 NOTE — Evaluation (Addendum)
 Occupational Therapy Evaluation Patient Details Name: Laurie Berg MRN: [id number] DOB: Apr 14, 1960 Today's Date: 04/26/2023   History of Present Illness   63 y.o. female  who presented to the ED with onset of dyspnea and intermittent dry congested cough. Admitted for management of acute on chronic hypoxic respiratory failure and acute decompensated COPD. PMH of CAD, DM2, tobacco abuse and COPD, on home O2 at 2 L/min     Clinical Impressions Pt was seen for OT evaluation this date. PTA, pt was living in a one level home with her daughter with 6 STE and bil HR. She reports IND with all tasks, driving and community mobile on 2L 02.  Pt presents to acute OT demonstrating no functional decline or impairment in ADL performance or mobility/transfers other than low sp02 readings with little activity. Pt IND with all mobility and ADL tasks during eval. Pt was edu on pacing, ECS, PLB, and use of DME and AE/AD to prevent overexertion and maximize her safety and IND. BUE strength intact. On 4L at rest 88%, dropped to 80% with minimal mobility to bathroom and back requiring increase to 5L to improve to 88% within a few minutes. Would benefit from mobility consult to prevent functional decline/weakness and maximize return to baseline 02 needs if able. OT to sign off in house with no recommended OT follow up upon DC.      If plan is discharge home, recommend the following:         Functional Status Assessment   Patient has not had a recent decline in their functional status     Equipment Recommendations   Tub/shower seat     Recommendations for Other Services         Precautions/Restrictions   Precautions Precautions: None Restrictions Weight Bearing Restrictions Per Provider Order: No     Mobility Bed Mobility Overal bed mobility: Independent                  Transfers Overall transfer level: Modified independent                        Balance  Overall balance assessment: Modified Independent                                         ADL either performed or assessed with clinical judgement   ADL Overall ADL's : Modified independent                                       General ADL Comments: MOD with LB dressing and toilet transfer     Vision         Perception         Praxis         Pertinent Vitals/Pain Pain Assessment Pain Assessment: No/denies pain     Extremity/Trunk Assessment Upper Extremity Assessment Upper Extremity Assessment: Overall WFL for tasks assessed   Lower Extremity Assessment Lower Extremity Assessment: Generalized weakness       Communication Communication Communication: No apparent difficulties   Cognition Arousal: Alert Behavior During Therapy: WFL for tasks assessed/performed Cognition: No apparent impairments  Following commands: Intact       Cueing  General Comments   Cueing Techniques: Verbal cues  4L at rest 88%, dropped to 80% with ~5-7 feet mobility, required increase to 5L to improve to 88% in short period of time-nail polish on nails difficult to know if readings accurate   Exercises Other Exercises Other Exercises: Edu on role of OT in acute setting Other Exercises: Edu on PLB, pacing, and other ECS including use of AE/AD/DME to minimze overexertion, increasing 02 during showering and other activities requiring increased WOB.   Shoulder Instructions      Home Living Family/patient expects to be discharged to:: Private residence Living Arrangements: Children (1 daughter lives with her and the other lives nearby) Available Help at Discharge: Family;Available PRN/intermittently Type of Home: Mobile home Home Access: Stairs to enter Entrance Stairs-Number of Steps: 6 Entrance Stairs-Rails: Can reach both;Left;Right Home Layout: One level     Bathroom Shower/Tub: Company secretary: Standard     Home Equipment: None   Additional Comments: oxygen concentrator and portable tank      Prior Functioning/Environment Prior Level of Function : Independent/Modified Independent;Driving             Mobility Comments: IND ADLs Comments: IND with ADL/IADLs, grocery shopping pushing shopping cart, etc.    OT Problem List: Decreased activity tolerance   OT Treatment/Interventions:        OT Goals(Current goals can be found in the care plan section)       OT Frequency:       Co-evaluation              AM-PAC OT "6 Clicks" Daily Activity     Outcome Measure Help from another person eating meals?: None Help from another person taking care of personal grooming?: None Help from another person toileting, which includes using toliet, bedpan, or urinal?: None Help from another person bathing (including washing, rinsing, drying)?: None Help from another person to put on and taking off regular upper body clothing?: None Help from another person to put on and taking off regular lower body clothing?: None 6 Click Score: 24   End of Session Equipment Utilized During Treatment: Oxygen Nurse Communication: Mobility status  Activity Tolerance: Patient limited by fatigue;Patient tolerated treatment well Patient left: in bed;with call bell/phone within reach;with nursing/sitter in room;with family/visitor present  OT Visit Diagnosis: Other abnormalities of gait and mobility (R26.89)                Time: 4098-1191 OT Time Calculation (min): 17 min Charges:  OT General Charges $OT Visit: 1 Visit OT Evaluation $OT Eval Low Complexity: 1 Low Laurie Berg, OTR/L 04/26/23, 9:20 AM  Laurie Berg 04/26/2023, 9:17 AM

## 2023-04-26 NOTE — Evaluation (Signed)
 Physical Therapy Evaluation Patient Details Name: Laurie Berg MRN: [id number] DOB: 1960/02/16 Today's Date: 04/26/2023  History of Present Illness  63 y.o. female  who presented to the ED with onset of dyspnea and intermittent dry congested cough. Admitted for management of acute on chronic hypoxic respiratory failure and acute decompensated COPD. PMH of CAD, DM2, tobacco abuse and COPD, on home O2 at 2 L/min  Clinical Impression  Pt is a pleasant 63 year old female who was admitted for COPD. Pt performs bed mobility with independence, transfers with mod I, and ambulation with supervision and no AD. Pt demonstrates deficits with endurance/mobility. O2 sats decrease quickly with exertion. Would benefit from skilled PT to address above deficits and promote optimal return to PLOF. Pt will continue to receive skilled PT services while admitted and will defer to TOC/care team for updates regarding disposition planning.       If plan is discharge home, recommend the following: A little help with walking and/or transfers;Help with stairs or ramp for entrance   Can travel by private vehicle        Equipment Recommendations  (TBD)  Recommendations for Other Services       Functional Status Assessment Patient has had a recent decline in their functional status and demonstrates the ability to make significant improvements in function in a reasonable and predictable amount of time.     Precautions / Restrictions Precautions Precautions: None Recall of Precautions/Restrictions: Intact Restrictions Weight Bearing Restrictions Per Provider Order: No      Mobility  Bed Mobility Overal bed mobility: Independent                  Transfers Overall transfer level: Modified independent Equipment used: None               General transfer comment: safe technique with upright posture    Ambulation/Gait Ambulation/Gait assistance: Supervision Gait Distance (Feet): 20  Feet Assistive device: None Gait Pattern/deviations: Step-through pattern       General Gait Details: ambulated in room with increased SOB symptoms noted. O2 sats decreased to 80% on 4L of O2.  Stairs            Wheelchair Mobility     Tilt Bed    Modified Rankin (Stroke Patients Only)       Balance Overall balance assessment: Modified Independent                                           Pertinent Vitals/Pain Pain Assessment Pain Assessment: No/denies pain    Home Living Family/patient expects to be discharged to:: Private residence Living Arrangements: Children Available Help at Discharge: Family;Available PRN/intermittently Type of Home: Mobile home Home Access: Stairs to enter Entrance Stairs-Rails: Can reach both;Left;Right Entrance Stairs-Number of Steps: 6   Home Layout: One level Home Equipment: None Additional Comments: oxygen concentrator and portable tank    Prior Function Prior Level of Function : Independent/Modified Independent;Driving             Mobility Comments: IND ADLs Comments: IND with ADL/IADLs, grocery shopping pushing shopping cart, etc.     Extremity/Trunk Assessment   Upper Extremity Assessment Upper Extremity Assessment: Overall WFL for tasks assessed    Lower Extremity Assessment Lower Extremity Assessment: Generalized weakness (B LE grossly 4/5)       Communication   Communication Communication: No apparent difficulties  Cognition Arousal: Alert Behavior During Therapy: WFL for tasks assessed/performed   PT - Cognitive impairments: No apparent impairments                       PT - Cognition Comments: pleasant and agreeable to session Following commands: Intact       Cueing Cueing Techniques: Verbal cues     General Comments      Exercises     Assessment/Plan    PT Assessment Patient needs continued PT services  PT Problem List Cardiopulmonary status limiting  activity       PT Treatment Interventions DME instruction;Balance training;Stair training;Therapeutic exercise    PT Goals (Current goals can be found in the Care Plan section)  Acute Rehab PT Goals Patient Stated Goal: to breathe easier PT Goal Formulation: With patient Time For Goal Achievement: 05/10/23 Potential to Achieve Goals: Good    Frequency Min 1X/week     Co-evaluation               AM-PAC PT "6 Clicks" Mobility  Outcome Measure Help needed turning from your back to your side while in a flat bed without using bedrails?: None Help needed moving from lying on your back to sitting on the side of a flat bed without using bedrails?: None Help needed moving to and from a bed to a chair (including a wheelchair)?: None Help needed standing up from a chair using your arms (e.g., wheelchair or bedside chair)?: None Help needed to walk in hospital room?: A Little Help needed climbing 3-5 steps with a railing? : A Lot 6 Click Score: 21    End of Session Equipment Utilized During Treatment: Oxygen Activity Tolerance: Patient tolerated treatment well Patient left: in bed Nurse Communication: Mobility status PT Visit Diagnosis: Muscle weakness (generalized) (M62.81);Difficulty in walking, not elsewhere classified (R26.2)    Time: 4098-1191 PT Time Calculation (min) (ACUTE ONLY): 12 min   Charges:   PT Evaluation $PT Eval Low Complexity: 1 Low   PT General Charges $$ ACUTE PT VISIT: 1 Visit         Elizabeth Palau, PT, DPT, GCS [phone]   Omid Deardorff 04/26/2023, 3:15 PM

## 2023-04-27 ENCOUNTER — Inpatient Hospital Stay

## 2023-04-27 ENCOUNTER — Other Ambulatory Visit: Payer: Self-pay

## 2023-04-27 DIAGNOSIS — K589 Irritable bowel syndrome without diarrhea: Secondary | ICD-10-CM

## 2023-04-27 LAB — CBC
HCT: 36.7 % (ref 36.0–46.0)
Hemoglobin: 12.2 g/dL (ref 12.0–15.0)
MCH: 29.9 pg (ref 26.0–34.0)
MCHC: 33.2 g/dL (ref 30.0–36.0)
MCV: 90 fL (ref 80.0–100.0)
Platelets: 227 10*3/uL (ref 150–400)
RBC: 4.08 MIL/uL (ref 3.87–5.11)
RDW: 15.7 % — ABNORMAL HIGH (ref 11.5–15.5)
WBC: 6.4 10*3/uL (ref 4.0–10.5)
nRBC: 0 % (ref 0.0–0.2)

## 2023-04-27 LAB — BASIC METABOLIC PANEL
Anion gap: 10 (ref 5–15)
BUN: 18 mg/dL (ref 8–23)
CO2: 32 mmol/L (ref 22–32)
Calcium: 8.6 mg/dL — ABNORMAL LOW (ref 8.9–10.3)
Chloride: 95 mmol/L — ABNORMAL LOW (ref 98–111)
Creatinine, Ser: 0.72 mg/dL (ref 0.44–1.00)
GFR, Estimated: >60 mL/min (ref >60)
Glucose, Bld: 222 mg/dL — ABNORMAL HIGH (ref 70–99)
Potassium: 4.2 mmol/L (ref 3.5–5.1)
Sodium: 137 mmol/L (ref 135–145)

## 2023-04-27 LAB — GLUCOSE, CAPILLARY
Glucose-Capillary: 179 mg/dL — ABNORMAL HIGH (ref 70–99)
Glucose-Capillary: 173 mg/dL — ABNORMAL HIGH (ref 70–99)

## 2023-04-27 LAB — C-REACTIVE PROTEIN: CRP: 5.8 mg/dL — ABNORMAL HIGH (ref <1.0)

## 2023-04-27 MED ORDER — PREDNISONE 10 MG PO TABS
ORAL_TABLET | ORAL | 0 refills | Status: DC
  Filled 2023-04-27: qty 21, 6d supply, fill #0

## 2023-04-27 MED ORDER — NICOTINE 21 MG/24HR TD PT24
21.0000 mg | MEDICATED_PATCH | TRANSDERMAL | 0 refills | Status: DC
  Filled 2023-04-27: qty 14, 14d supply, fill #0

## 2023-04-27 MED ORDER — METFORMIN HCL ER 500 MG PO TB24
1000.0000 mg | ORAL_TABLET | Freq: Two times a day (BID) | ORAL | 2 refills | Status: AC
  Filled 2023-04-27 (×2): qty 120, 30d supply, fill #0

## 2023-04-27 MED ORDER — POLYETHYLENE GLYCOL 3350 17 G PO PACK: 17.0000 g | PACK | Freq: Two times a day (BID) | ORAL | 0 refills | Status: DC

## 2023-04-27 MED ORDER — GUAIFENESIN ER 600 MG PO TB12: 600.0000 mg | ORAL_TABLET | Freq: Two times a day (BID) | ORAL | 0 refills | Status: AC

## 2023-04-27 MED ORDER — SENNOSIDES-DOCUSATE SODIUM 8.6-50 MG PO TABS: 2.0000 | ORAL_TABLET | Freq: Every evening | ORAL | 0 refills | Status: AC | PRN

## 2023-04-27 MED ORDER — DOXYCYCLINE HYCLATE 100 MG PO TABS
100.0000 mg | ORAL_TABLET | Freq: Two times a day (BID) | ORAL | 0 refills | Status: DC
  Filled 2023-04-27: qty 10, 5d supply, fill #0

## 2023-04-27 MED ORDER — METHYLPREDNISOLONE SODIUM SUCC 125 MG IJ SOLR
60.0000 mg | Freq: Every day | INTRAMUSCULAR | Status: DC
  Administered 2023-04-27: 60 mg via INTRAVENOUS
  Filled 2023-04-27: qty 2

## 2023-04-27 MED ORDER — IPRATROPIUM-ALBUTEROL 0.5-2.5 (3) MG/3ML IN SOLN
3.0000 mL | Freq: Three times a day (TID) | RESPIRATORY_TRACT | Status: DC
  Filled 2023-04-27: qty 3

## 2023-04-27 NOTE — Plan of Care (Signed)

## 2023-04-27 NOTE — TOC Transition Note (Signed)
 Transition of Care [facility]) - Discharge Note   Patient Details  Name: Laurie Berg MRN: [id number] Date of Birth: [birth year]  [facility]) CM/SW Contact:  Truddie Hidden, RN Phone Number: 04/27/2023, 12:45 PM   Clinical Narrative:    Spoke with patient and her daughter regarding discharge plans. Patient stated she does not want HH. She hasa aide that is able to assist her. Patient also stated there is no one that can bring her home oxygen to discharge home with. She stated she lives less than 20 minutes away and will be ok without it until she gets home. Patient daughter is at bedside and will transport patient home but is unable to get home oxygen. Patient daughter does not feel she is medically ready to discharge and is requesting to speak with MD.   MD notified of patient and daughter request to speak with him.          Patient Goals and CMS Choice            Discharge Placement                       Discharge Plan and Services Additional resources added to the After Visit Summary for                                       Social Drivers of Health (SDOH) Interventions SDOH Screenings   Tobacco Use: High Risk (04/23/2023)     Readmission Risk Interventions     No data to display

## 2023-04-27 NOTE — Progress Notes (Signed)
 Attempted to ambulate patient, patient states she does not feel up to it at this time. Will try again later

## 2023-04-27 NOTE — Plan of Care (Signed)
 Problem: Fluid Volume: Goal: Hemodynamic stability will improve 04/27/2023 1316 by Serita Grit, RN Outcome: Completed/Met 04/27/2023 1020 by Serita Grit, RN Outcome: Progressing   Problem: Clinical Measurements: Goal: Diagnostic test results will improve 04/27/2023 1316 by Serita Grit, RN Outcome: Completed/Met 04/27/2023 1020 by Serita Grit, RN Outcome: Progressing Goal: Signs and symptoms of infection will decrease 04/27/2023 1316 by Serita Grit, RN Outcome: Completed/Met 04/27/2023 1020 by Serita Grit, RN Outcome: Progressing   Problem: Respiratory: Goal: Ability to maintain adequate ventilation will improve 04/27/2023 1316 by Serita Grit, RN Outcome: Completed/Met 04/27/2023 1020 by Serita Grit, RN Outcome: Progressing   Problem: Education: Goal: Knowledge of General Education information will improve Description: Including pain rating scale, medication(s)/side effects and non-pharmacologic comfort measures 04/27/2023 1316 by Serita Grit, RN Outcome: Completed/Met 04/27/2023 1020 by Serita Grit, RN Outcome: Progressing   Problem: Health Behavior/Discharge Planning: Goal: Ability to manage health-related needs will improve 04/27/2023 1316 by Serita Grit, RN Outcome: Completed/Met 04/27/2023 1020 by Serita Grit, RN Outcome: Progressing   Problem: Clinical Measurements: Goal: Ability to maintain clinical measurements within normal limits will improve 04/27/2023 1316 by Serita Grit, RN Outcome: Completed/Met 04/27/2023 1020 by Serita Grit, RN Outcome: Progressing Goal: Will remain free from infection 04/27/2023 1316 by Serita Grit, RN Outcome: Completed/Met 04/27/2023 1020 by Serita Grit, RN Outcome: Progressing Goal: Diagnostic test results will improve 04/27/2023 1316 by Serita Grit, RN Outcome: Completed/Met 04/27/2023 1020 by Serita Grit, RN Outcome:  Progressing Goal: Respiratory complications will improve 04/27/2023 1316 by Serita Grit, RN Outcome: Completed/Met 04/27/2023 1020 by Serita Grit, RN Outcome: Progressing Goal: Cardiovascular complication will be avoided 04/27/2023 1316 by Serita Grit, RN Outcome: Completed/Met 04/27/2023 1020 by Serita Grit, RN Outcome: Progressing   Problem: Activity: Goal: Risk for activity intolerance will decrease 04/27/2023 1316 by Serita Grit, RN Outcome: Completed/Met 04/27/2023 1020 by Serita Grit, RN Outcome: Progressing   Problem: Nutrition: Goal: Adequate nutrition will be maintained 04/27/2023 1316 by Serita Grit, RN Outcome: Completed/Met 04/27/2023 1020 by Serita Grit, RN Outcome: Progressing   Problem: Coping: Goal: Level of anxiety will decrease 04/27/2023 1316 by Serita Grit, RN Outcome: Completed/Met 04/27/2023 1020 by Serita Grit, RN Outcome: Progressing   Problem: Elimination: Goal: Will not experience complications related to bowel motility 04/27/2023 1316 by Serita Grit, RN Outcome: Completed/Met 04/27/2023 1020 by Serita Grit, RN Outcome: Progressing Goal: Will not experience complications related to urinary retention 04/27/2023 1316 by Serita Grit, RN Outcome: Completed/Met 04/27/2023 1020 by Serita Grit, RN Outcome: Progressing   Problem: Pain Managment: Goal: General experience of comfort will improve and/or be controlled 04/27/2023 1316 by Serita Grit, RN Outcome: Completed/Met 04/27/2023 1020 by Serita Grit, RN Outcome: Progressing   Problem: Safety: Goal: Ability to remain free from injury will improve 04/27/2023 1316 by Serita Grit, RN Outcome: Completed/Met 04/27/2023 1020 by Serita Grit, RN Outcome: Progressing   Problem: Skin Integrity: Goal: Risk for impaired skin integrity will decrease 04/27/2023 1316 by Serita Grit, RN Outcome:  Completed/Met 04/27/2023 1020 by Serita Grit, RN Outcome: Progressing   Problem: Education: Goal: Ability to describe self-care measures that may prevent or decrease complications (Diabetes Survival Skills Education) will improve 04/27/2023 1316 by Serita Grit, RN Outcome: Completed/Met 04/27/2023 1020 by Serita Grit, RN Outcome: Progressing Goal: Individualized Educational Video(s) 04/27/2023 1316 by  Serita Grit, RN Outcome: Completed/Met 04/27/2023 1020 by Serita Grit, RN Outcome: Progressing   Problem: Coping: Goal: Ability to adjust to condition or change in health will improve 04/27/2023 1316 by Serita Grit, RN Outcome: Completed/Met 04/27/2023 1020 by Serita Grit, RN Outcome: Progressing   Problem: Fluid Volume: Goal: Ability to maintain a balanced intake and output will improve 04/27/2023 1316 by Serita Grit, RN Outcome: Completed/Met 04/27/2023 1020 by Serita Grit, RN Outcome: Progressing   Problem: Health Behavior/Discharge Planning: Goal: Ability to identify and utilize available resources and services will improve 04/27/2023 1316 by Serita Grit, RN Outcome: Completed/Met 04/27/2023 1020 by Serita Grit, RN Outcome: Progressing Goal: Ability to manage health-related needs will improve 04/27/2023 1316 by Serita Grit, RN Outcome: Completed/Met 04/27/2023 1020 by Serita Grit, RN Outcome: Progressing   Problem: Metabolic: Goal: Ability to maintain appropriate glucose levels will improve 04/27/2023 1316 by Serita Grit, RN Outcome: Completed/Met 04/27/2023 1020 by Serita Grit, RN Outcome: Progressing   Problem: Nutritional: Goal: Maintenance of adequate nutrition will improve 04/27/2023 1316 by Serita Grit, RN Outcome: Completed/Met 04/27/2023 1020 by Serita Grit, RN Outcome: Progressing Goal: Progress toward achieving an optimal weight will improve 04/27/2023 1316 by  Serita Grit, RN Outcome: Completed/Met 04/27/2023 1020 by Serita Grit, RN Outcome: Progressing   Problem: Skin Integrity: Goal: Risk for impaired skin integrity will decrease 04/27/2023 1316 by Serita Grit, RN Outcome: Completed/Met 04/27/2023 1020 by Serita Grit, RN Outcome: Progressing   Problem: Tissue Perfusion: Goal: Adequacy of tissue perfusion will improve 04/27/2023 1316 by Serita Grit, RN Outcome: Completed/Met 04/27/2023 1020 by Serita Grit, RN Outcome: Progressing

## 2023-04-27 NOTE — Plan of Care (Signed)
  Problem: Fluid Volume: Goal: Hemodynamic stability will improve Outcome: Progressing   Problem: Clinical Measurements: Goal: Diagnostic test results will improve Outcome: Progressing Goal: Signs and symptoms of infection will decrease Outcome: Progressing   Problem: Education: Goal: Knowledge of General Education information will improve Description: Including pain rating scale, medication(s)/side effects and non-pharmacologic comfort measures Outcome: Progressing   Problem: Clinical Measurements: Goal: Ability to maintain clinical measurements within normal limits will improve Outcome: Progressing Goal: Will remain free from infection Outcome: Progressing Goal: Diagnostic test results will improve Outcome: Progressing Goal: Respiratory complications will improve Outcome: Progressing Goal: Cardiovascular complication will be avoided Outcome: Progressing   Problem: Activity: Goal: Risk for activity intolerance will decrease Outcome: Progressing   Problem: Nutrition: Goal: Adequate nutrition will be maintained Outcome: Not Progressing   Problem: Elimination: Goal: Will not experience complications related to bowel motility Outcome: Progressing Goal: Will not experience complications related to urinary retention Outcome: Progressing   Problem: Pain Managment: Goal: General experience of comfort will improve and/or be controlled Outcome: Progressing   Problem: Skin Integrity: Goal: Risk for impaired skin integrity will decrease Outcome: Progressing   Problem: Education: Goal: Ability to describe self-care measures that may prevent or decrease complications (Diabetes Survival Skills Education) will improve Outcome: Not Progressing   Problem: Health Behavior/Discharge Planning: Goal: Ability to identify and utilize available resources and services will improve Outcome: Not Progressing   Problem: Nutritional: Goal: Maintenance of adequate nutrition will  improve Outcome: Progressing Goal: Progress toward achieving an optimal weight will improve Outcome: Progressing   Problem: Skin Integrity: Goal: Risk for impaired skin integrity will decrease Outcome: Progressing   Problem: Tissue Perfusion: Goal: Adequacy of tissue perfusion will improve Outcome: Progressing

## 2023-04-27 NOTE — Progress Notes (Signed)
 AVS given and reviewed with patient and daughters. PIV removed. All questions answered. Waiting for loaner oxygen to arrive to room

## 2023-04-27 NOTE — Progress Notes (Signed)
 PULMONOLOGY         Date: 04/27/2023,   MRN# [id number] Laurie Berg 02-Aug-1960     AdmissionWeight: 71.2 kg                 CurrentWeight: 75.8 kg  Referring provider: Dr Sherryll Burger   CHIEF COMPLAINT:   Acute on chronic hypoxemic respiratory failure   HISTORY OF PRESENT ILLNESS   This is a 63 yo F with history of lifelong smoking, COPD with chronic hypoxemia on home Trelegy once daily inhaler, previous bouts of COVID19, CAD, lumbago and cervical radiculopathy, DM2, lung nodules who came in with complaints of wheezing and dyspnea x 4 days with increased O2 requirement. She generally uses 2L/min [place] supplemental O2 at home but noted oxygen was dropping in the mid 70s.  She does continue to smoke and we discussed smoking cessation in the past. She was seen in ER with labored breathing, tachypnea, tachycardia and required 6L/min [place] to reach normoxia.  She had chest imaging performed noted to have bronchitic changes suggestive of COPD exacerbation but absence of florid pneumonia.  She does report onset of pedal edema recently.  She was placed on empiric antibiotics with vanco/cefepime/flagyl and started on LR for IVF. She had RVP which was negative.  She had CT head which was normal besides age related microvascular disease.   04/27/23- patient has improved, still has wheezing.  She is using nebulizer therapy.  CXR ordered for today.  She did have PT yesterday and did well. CRP is elevated. Will keep IV solumedrol but can wean down to 60mg  once daily from 40 bid. She slept better.  Plan to wean down O2 and start dc planning. Labs have been reviewed and are better.  Family was at bedside during my evaluation.    PAST MEDICAL HISTORY   Past Medical History:  Diagnosis Date   Cervical radiculopathy    Chronic pain syndrome    Colon polyps    COPD (chronic obstructive pulmonary disease) (HCC)    Coronary artery calcification seen on CT scan    a. 04/2021 High Res CT Chest: Ao  atherosclerosis, cor Ca2+; b. 07/2021 MV: EF 69%, no isch/infarct, LAD/LCX Ca2+ noted; c. 02/2022 CT Chest: Cor and Ao atherosclerosis.   COVID-19 virus infection    Diabetes mellitus without complication (HCC)    History of echocardiogram    a. 08/2021 Echo: EF 55-60%, no rwma, nl RV fxn.   Lung nodule    a. 02/2022 CT Chest: RLL 7mm solid subpleural pulm nodule, prev 57mm-->rec thoracic surgery eval.   Sepsis (HCC)    Tobacco abuse      SURGICAL HISTORY   Past Surgical History:  Procedure Laterality Date   CARPAL TUNNEL RELEASE     COLONOSCOPY WITH PROPOFOL N/A 10/21/2019   Procedure: COLONOSCOPY WITH PROPOFOL;  Surgeon: Pasty Spillers, MD;  Location: [facility] ENDOSCOPY;  Service: Endoscopy;  Laterality: N/A;   DILATION AND CURETTAGE OF UTERUS     TUBAL LIGATION       FAMILY HISTORY   History reviewed. No pertinent family history.   SOCIAL HISTORY   Social History   Tobacco Use   Smoking status: Every Day    Current packs/day: 1.75    Average packs/day: 1.8 packs/day for 48.0 years (84.0 ttl pk-yrs)    Types: Cigarettes   Smokeless tobacco: Never  Vaping Use   Vaping status: Never Used  Substance Use Topics   Alcohol use: Not Currently  Drug use: Never     MEDICATIONS    Home Medication:    Current Medication:  Current Facility-Administered Medications:    acetaminophen (TYLENOL) tablet 650 mg, 650 mg, Oral, Q6H PRN, 650 mg at 04/25/23 1610 **OR** acetaminophen (TYLENOL) suppository 650 mg, 650 mg, Rectal, Q6H PRN, Mansy, Jan A, MD   arformoterol (BROVANA) nebulizer solution 15 mcg, 15 mcg, Nebulization, BID, Sreenath, Sudheer B, MD, 15 mcg at 04/27/23 9604   aspirin chewable tablet 81 mg, 81 mg, Oral, Daily, Mansy, Jan A, MD, 81 mg at 04/26/23 0853   atorvastatin (LIPITOR) tablet 20 mg, 20 mg, Oral, Daily, Mansy, Jan A, MD, 20 mg at 04/26/23 5409   chlorpheniramine-HYDROcodone (TUSSIONEX) 10-8 MG/5ML suspension 5 mL, 5 mL, Oral, Q12H PRN, Mansy, Jan A, MD    cyclobenzaprine (FLEXERIL) tablet 10 mg, 10 mg, Oral, BID PRN, Mansy, Jan A, MD, 10 mg at 04/25/23 8119   doxycycline (VIBRA-TABS) tablet 100 mg, 100 mg, Oral, Q12H, Joannie Medine, MD, 100 mg at 04/26/23 2122   DULoxetine (CYMBALTA) DR capsule 90 mg, 90 mg, Oral, Daily, Mansy, Jan A, MD, 90 mg at 04/26/23 0853   enoxaparin (LOVENOX) injection 40 mg, 40 mg, Subcutaneous, Q24H, Mansy, Jan A, MD, 40 mg at 04/26/23 0853   guaiFENesin (MUCINEX) 12 hr tablet 600 mg, 600 mg, Oral, BID, Mansy, Jan A, MD, 600 mg at 04/26/23 2122   insulin aspart (novoLOG) injection 0-5 Units, 0-5 Units, Subcutaneous, QHS, Delfino Lovett, MD, 3 Units at 04/25/23 2311   insulin aspart (novoLOG) injection 0-9 Units, 0-9 Units, Subcutaneous, TID WC, Delfino Lovett, MD, 2 Units at 04/26/23 1609   ipratropium-albuterol (DUONEB) 0.5-2.5 (3) MG/3ML nebulizer solution 3 mL, 3 mL, Nebulization, QID, Mansy, Jan A, MD, 3 mL at 04/27/23 1478   linaclotide (LINZESS) capsule 72 mcg, 72 mcg, Oral, q morning, Mansy, Jan A, MD, 72 mcg at 04/26/23 0854   loratadine (CLARITIN) tablet 10 mg, 10 mg, Oral, Daily, Mansy, Jan A, MD, 10 mg at 04/26/23 2956   magnesium hydroxide (MILK OF MAGNESIA) suspension 30 mL, 30 mL, Oral, Daily PRN, Mansy, Jan A, MD   methylPREDNISolone sodium succinate (SOLU-MEDROL) 40 mg/mL injection 40 mg, 40 mg, Intravenous, Q12H, Vida Rigger, MD, 40 mg at 04/26/23 2121   multivitamin with minerals tablet 1 tablet, 1 tablet, Oral, Daily, Mansy, Jan A, MD, 1 tablet at 04/26/23 0853   naloxone (NARCAN) nasal spray 4 mg/0.1 mL, 1 spray, Nasal, Once PRN, Mansy, Jan A, MD   nicotine (NICODERM CQ - dosed in mg/24 hours) patch 21 mg, 21 mg, Transdermal, Daily, Mansy, Jan A, MD, 21 mg at 04/26/23 0854   ondansetron (ZOFRAN) tablet 4 mg, 4 mg, Oral, Q6H PRN **OR** ondansetron (ZOFRAN) injection 4 mg, 4 mg, Intravenous, Q6H PRN, Mansy, Jan A, MD   oxyCODONE (OXYCONTIN) 12 hr tablet 30 mg, 30 mg, Oral, BID, Mansy, Jan A, MD, 30 mg at  04/26/23 2121   oxyCODONE-acetaminophen (PERCOCET/ROXICET) 5-325 MG per tablet 1 tablet, 1 tablet, Oral, Q6H PRN, Mansy, Jan A, MD, 1 tablet at 04/24/23 1656   polyethylene glycol (MIRALAX / GLYCOLAX) packet 17 g, 17 g, Oral, BID, Delfino Lovett, MD, 17 g at 04/26/23 2122   senna-docusate (Senokot-S) tablet 2 tablet, 2 tablet, Oral, BID, Delfino Lovett, MD, 2 tablet at 04/26/23 2122   sodium chloride (OCEAN) 0.65 % nasal spray 1 spray, 1 spray, Each Nare, PRN, Delfino Lovett, MD, 1 spray at 04/26/23 0420   traZODone (DESYREL) tablet 25 mg, 25 mg, Oral,  QHS PRN, Mansy, Vernetta Honey, MD    ALLERGIES   Pregabalin and Sulfamethoxazole-trimethoprim     REVIEW OF SYSTEMS    Review of Systems:  Gen:  Denies  fever, sweats, chills weigh loss  HEENT: Denies blurred vision, double vision, ear pain, eye pain, hearing loss, nose bleeds, sore throat Cardiac:  No dizziness, chest pain or heaviness, chest tightness,edema Resp:   reports dyspnea chronically  Gi: Denies swallowing difficulty, stomach pain, nausea or vomiting, diarrhea, constipation, bowel incontinence Gu:  Denies bladder incontinence, burning urine Ext:   Denies Joint pain, stiffness or swelling Skin: Denies  skin rash, easy bruising or bleeding or hives Endoc:  Denies polyuria, polydipsia , polyphagia or weight change Psych:   Denies depression, insomnia or hallucinations   Other:  All other systems negative   VS: BP (!) 155/92 (BP Location: Left Arm)   Pulse 77   Temp 98.2 F (36.8 C) (Oral)   Resp 20   Ht 5\' 4"  (1.626 m)   Wt 75.8 kg   SpO2 96%   BMI 28.70 kg/m      PHYSICAL EXAM    GENERAL:NAD, no fevers, chills, no weakness no fatigue HEAD: Normocephalic, atraumatic.  EYES: Pupils equal, round, reactive to light. Extraocular muscles intact. No scleral icterus.  MOUTH: Moist mucosal membrane. Dentition intact. No abscess noted.  EAR, NOSE, THROAT: Clear without exudates. No external lesions.  NECK: Supple. No thyromegaly.  No nodules. No JVD.  PULMONARY: decreased breath sounds with mild rhonchi worse at bases bilaterally.  CARDIOVASCULAR: S1 and S2. Regular rate and rhythm. No murmurs, rubs, or gallops. No edema. Pedal pulses 2+ bilaterally.  GASTROINTESTINAL: Soft, nontender, nondistended. No masses. Positive bowel sounds. No hepatosplenomegaly.  MUSCULOSKELETAL: No swelling, clubbing, or edema. Range of motion full in all extremities.  NEUROLOGIC: Cranial nerves II through XII are intact. No gross focal neurological deficits. Sensation intact. Reflexes intact.  SKIN: No ulceration, lesions, rashes, or cyanosis. Skin warm and dry. Turgor intact.  PSYCHIATRIC: Mood, affect within normal limits. The patient is awake, alert and oriented x 3. Insight, judgment intact.       IMAGING     ASSESSMENT/PLAN   Acute on chronic hypoxemic respiratory failure   - thus far infectious workup is negative   - will narrow antimicrobial regimen to doxycycline BID   - CRP >10    - continue steroids with tapering protocol , reduced from 40 BID to 60 once daily     - nebulizer therapy per COPD carepath    -CXR above with hazy opacification of RLL but clear costophrenic and cardiophrenic angle- have ordered repeat for today    - no need for pulmicort nebulizer therapy     Bibasilar atelectasis     - chest physiotherapy with incentive spirometry and flutter valve   - PT/OT when able     Tobacco dependence    - nicotine replacement has been provided   Allergic rhinosinusitis   - continue [place] nasal spary    Insomnia     Trazadone QHS      Thank you for allowing me to participate in the care of this patient.   Patient/Family are satisfied with care plan and all questions have been answered.    Provider disclosure: Patient with at least one acute or chronic illness or injury that poses a threat to life or bodily function and is being managed actively during this encounter.  All of the below services have  been performed independently by  signing provider:  review of prior documentation from internal and or external health records.  Review of previous and current lab results.  Interview and comprehensive assessment during patient visit today. Review of current and previous chest radiographs/CT scans. Discussion of management and test interpretation with health care team and patient/family.   This document was prepared using Dragon voice recognition software and may include unintentional dictation errors.     Vida Rigger, M.D.  Division of Pulmonary & Critical Care Medicine

## 2023-04-28 LAB — CULTURE, BLOOD (ROUTINE X 2)
Culture: NO GROWTH
Culture: NO GROWTH
Special Requests: ADEQUATE

## 2023-04-28 NOTE — Discharge Summary (Signed)
 Physician Discharge Summary   Patient: Laurie Berg MRN: [id number] DOB: 04/18/1960  Admit date:     04/23/2023  Discharge date: 04/27/2023  Discharge Physician: Delfino Lovett   PCP: Leanna Sato, MD   Recommendations at discharge:   Follow-up with outpatient providers as requested  Discharge Diagnoses: Principal Problem:   Sepsis due to pneumonia [facility]) Active Problems:   Dyslipidemia   GERD without esophagitis   Type 2 diabetes mellitus without complications (HCC)   IBS (irritable bowel syndrome)   COPD (chronic obstructive pulmonary disease) [facility])  Hospital Course: Assessment and Plan:  63 y.o. female with medical history significant for coronary artery disease, type 2 diabetes mellitus and COPD, on home O2 at 2 L/min, who presented to the emergency room with onset of dyspnea and intermittent dry congested cough which have been worsening over the last 4 to 5 days.  She had a fever 103 here.  Pulse oximetry was 77% on room air.  She was placed on nonrebreather with pulse oximetry improving to 99%.  She admitted to cough with wheezing.  No chest pain or palpitations.  No nausea or vomiting or abdominal pain.  No dysuria, oliguria or hematuria or flank pain.  No bleeding diathesis.  The patient continues to smoke 4 to 5 cigarettes/day.    3/12: Pulmonary and palliative care consult, started bowel regimen no BM for about a week per patient 3/13: PT and OT eval.  Discontinue telemetry   Acute on chronic hypoxic respiratory failure Acute decompensated COPD Unclear trigger.  Possible viral syndrome though COVID, flu, RSV, RVP all negative.  No infiltrate or white blood cell count to suggest bacterial pneumonia. Improved with steroids, antibiotic and some diuresis. Good response to inhalers and nebulizer breathing treatment. Encouraged use of incentive spirometry, flutter valve Seen by pulmonary.  She is followed by Dr. Meredeth Ide as an outpatient She will need 2 to 3 L oxygen via nasal  cannula at discharge. As per patient's daughters who are at bedside patient has been noncompliant with her oxygen at home and continues to smoke despite multiple counseling session and education.  Patient also told me very clearly that she is not going to stop smoking   Hyperlipidemia Continue statin   Possible sepsis, ruled out SIRS criteria likely driven by decompensated COPD.  No clinical indication of infection.    Type 2 diabetes mellitus without complication Sliding scale coverage while in the hospital   GERD PPI   Constipation Likely due to chronic narcotics, started bowel regimen and recommend to continue at discharge  She is at very high risk for readmission.  She is noncompliant with her oxygen and continues to smoke at home.       Consultants: Pulmonary  Disposition: Home Diet recommendation:  Discharge Diet Orders (From admission, onward)     Start     Ordered   04/27/23 0000  Diet - low sodium heart healthy        04/27/23 1215           Carb modified diet DISCHARGE MEDICATION: Allergies as of 04/27/2023       Reactions   Pregabalin Shortness Of Breath, Swelling   "Felt like I couldn't breathe and my tongue was swelling up"   Sulfamethoxazole-trimethoprim Itching        Medication List     TAKE these medications    albuterol 108 (90 Base) MCG/ACT inhaler Commonly known as: VENTOLIN HFA Inhale 1 puff into the lungs as needed.  aspirin 81 MG chewable tablet Chew 1 tablet (81 mg total) by mouth daily.   atorvastatin 20 MG tablet Commonly known as: LIPITOR Take 1 tablet (20 mg total) by mouth daily.   BC HEADACHE POWDER PO Take 1 packet by mouth as needed (pain).   Blood Glucose Monitoring Suppl Devi 1 each by Does not apply route in the morning, at noon, and at bedtime. May substitute to any manufacturer covered by patient's insurance.   cetirizine 10 MG tablet Commonly known as: ZYRTEC Take 10 mg by mouth daily.   cyclobenzaprine  10 MG tablet Commonly known as: FLEXERIL Take 10 mg by mouth 2 (two) times daily as needed for muscle spasms.   doxycycline 100 MG tablet Commonly known as: VIBRA-TABS Take 1 tablet (100 mg total) by mouth every 12 (twelve) hours for 5 days.   DULoxetine 30 MG capsule Commonly known as: CYMBALTA Take 90 mg by mouth daily.   guaiFENesin 600 MG 12 hr tablet Commonly known as: MUCINEX Take 1 tablet (600 mg total) by mouth 2 (two) times daily for 21 days.   ipratropium-albuterol 0.5-2.5 (3) MG/3ML Soln Commonly known as: DUONEB Take 3 mLs by nebulization every 6 (six) hours as needed.   Linzess 72 MCG capsule Generic drug: linaclotide Take 72 mcg by mouth every morning.   metFORMIN 500 MG 24 hr tablet Commonly known as: GLUCOPHAGE-XR Take 2 tablets (1,000 mg total) by mouth 2 (two) times daily with a meal.   multivitamin with minerals Tabs tablet Take 1 tablet by mouth daily.   naloxone 4 MG/0.1ML Liqd nasal spray kit Commonly known as: NARCAN One spray in either nostril once for known/suspected opioid overdose. May repeat every 2-3 minutes in alternating nostril til EMS arrives   Nicotine Step 1 21 MG/24HR patch Generic drug: nicotine Place 1 patch (21 mg total) onto the skin daily for 14 days. What changed: when to take this   omeprazole 40 MG capsule Commonly known as: PRILOSEC Take 40 mg by mouth daily.   oxyCODONE-acetaminophen 5-325 MG tablet Commonly known as: PERCOCET/ROXICET Take 1 tablet by mouth every 6 (six) hours as needed.   OxyCONTIN 30 MG 12 hr tablet Generic drug: oxyCODONE Take 1 tablet by mouth 2 (two) times daily.   polyethylene glycol 17 g packet Commonly known as: MIRALAX / GLYCOLAX Take 17 g by mouth 2 (two) times daily.   predniSONE 10 MG tablet Commonly known as: DELTASONE Take 6 tablets (60 mg total) by mouth daily for 1 day, THEN 5 tablets (50 mg total) daily for 1 day, THEN 4 tablets (40 mg total) daily for 1 day, THEN 3 tablets (30 mg  total) daily for 1 day, THEN 2 tablets (20 mg total) daily for 1 day, THEN 1 tablet (10 mg total) daily for 1 day. Start taking on: April 27, 2023 What changed:  medication strength See the new instructions.   senna-docusate 8.6-50 MG tablet Commonly known as: Senokot-S Take 2 tablets by mouth at bedtime as needed for mild constipation.   Tradjenta 5 MG Tabs tablet Generic drug: linagliptin Take 5 mg by mouth daily.   Trelegy Ellipta 100-62.5-25 MCG/ACT Aepb Generic drug: Fluticasone-Umeclidin-Vilant Take 1 puff by mouth daily.        Follow-up Information     Leanna Sato, MD. Schedule an appointment as soon as possible for a visit in 1 week(s).   Specialty: Family Medicine Why: [facility] Discharge F/UP Follow up Appointment: Please have patient book appointment office was  not open. Contact information: [address] [place] [address] [phone]         Mertie Moores, MD. Schedule an appointment as soon as possible for a visit in 2 week(s).   Specialty: Specialist Why: [facility] Discharge F/UP Contact information: [address] [place] [address] [phone]                Discharge Exam: Filed Weights   04/25/23 1634 04/26/23 0608 04/27/23 0517  Weight: 76.4 kg 76.3 kg 75.8 kg   General exam: Appears calm and comfortable  Respiratory system: Clear to auscultation bilaterally Cardiovascular system: S1-2, RRR, no murmurs, no pedal edema Gastrointestinal system: Soft, benign Central nervous system: Alert and oriented. No focal neurological deficits. Extremities: Symmetric 5 x 5 power. Skin: No rashes, lesions or ulcers Psychiatry: Judgement and insight appear normal. Mood & affect appropriate.   Condition at discharge: fair  The results of significant diagnostics from this hospitalization (including imaging, microbiology, ancillary and laboratory) are listed below for reference.   Imaging Studies: DG Chest Port  1 View Result Date: 04/27/2023 CLINICAL DATA:  Bilateral pulmonary infiltrates. EXAM: PORTABLE CHEST 1 VIEW COMPARISON:  04/23/2023 FINDINGS: Increased patchy densities in the bilateral upper lobes. Probable atelectasis at the left lung base. Heart size is within normal limits and stable. Atherosclerotic calcifications at the aortic arch. Negative for a pneumothorax. IMPRESSION: Subtle patchy densities in the bilateral upper lobes. Findings concerning for an infectious etiology. Electronically Signed   By: Richarda Overlie M.D.   On: 04/27/2023 12:14   CT HEAD WO CONTRAST ( ) Result Date: 04/24/2023 CLINICAL DATA:  Mental status change. EXAM: CT HEAD WITHOUT CONTRAST TECHNIQUE: Contiguous axial images were obtained from the base of the skull through the vertex without intravenous contrast. RADIATION DOSE REDUCTION: This exam was performed according to the departmental dose-optimization program which includes automated exposure control, adjustment of the mA and/or kV according to patient size and/or use of iterative reconstruction technique. COMPARISON:  None Available. FINDINGS: Brain: No acute intracranial hemorrhage. No CT evidence of acute infarct. Nonspecific hypoattenuation in the periventricular and subcortical white matter favored to reflect chronic microvascular ischemic changes. No edema, mass effect, or midline shift. The basilar cisterns are patent. Ventricles: The ventricles are normal. Vascular: Atherosclerotic calcifications of the carotid siphons and intracranial vertebral arteries. No hyperdense vessel. Skull: No acute or aggressive finding. Orbits: Orbits are symmetric. Sinuses: The visualized paranasal sinuses are clear. Other: Mastoid air cells are clear. IMPRESSION: 1. No acute intracranial abnormality. 2. Moderate chronic microvascular ischemic changes. Electronically Signed   By: Emily Filbert M.D.   On: 04/24/2023 10:38   DG Chest Port 1 View Result Date: 04/23/2023 CLINICAL DATA:  Possible  sepsis EXAM: PORTABLE CHEST 1 VIEW COMPARISON:  03/21/2022 FINDINGS: Emphysema and bronchitic changes. No consolidation, pleural effusion or pneumothorax. Normal cardiac size. Aortic atherosclerosis. IMPRESSION: No active disease. Emphysema and bronchitic changes. Electronically Signed   By: Jasmine Pang M.D.   On: 04/23/2023 23:24    Microbiology: Results for orders placed or performed during the hospital encounter of 04/23/23  Resp panel by RT-PCR (RSV, Flu A&B, Covid) Anterior Nasal Swab     Status: None   Collection Time: 04/23/23  8:16 PM   Specimen: Anterior Nasal Swab  Result Value Ref Range Status   SARS Coronavirus 2 by RT PCR NEGATIVE NEGATIVE Final    Comment: (NOTE) SARS-CoV-2 target nucleic acids are NOT DETECTED.  The SARS-CoV-2 RNA is generally detectable in upper  respiratory specimens during the acute phase of infection. The lowest concentration of SARS-CoV-2 viral copies this assay can detect is 138 copies/mL. A negative result does not preclude SARS-Cov-2 infection and should not be used as the sole basis for treatment or other patient management decisions. A negative result may occur with  improper specimen collection/handling, submission of specimen other than nasopharyngeal swab, presence of viral mutation(s) within the areas targeted by this assay, and inadequate number of viral copies(<138 copies/mL). A negative result must be combined with clinical observations, patient history, and epidemiological information. The expected result is Negative.  Fact Sheet for Patients:  BloggerCourse.com  Fact Sheet for Healthcare Providers:  SeriousBroker.it  This test is no t yet approved or cleared by the [place] FDA and  has been authorized for detection and/or diagnosis of SARS-CoV-2 by FDA under an Emergency Use Authorization (EUA). This EUA will remain  in effect (meaning this test can be used) for the duration  of the COVID-19 declaration under Section 564(b)(1) of the Act, 21 U.S.C.section 360bbb-3(b)(1), unless the authorization is terminated  or revoked sooner.       Influenza A by PCR NEGATIVE NEGATIVE Final   Influenza B by PCR NEGATIVE NEGATIVE Final    Comment: (NOTE) The Xpert Xpress SARS-CoV-2/FLU/RSV plus assay is intended as an aid in the diagnosis of influenza from Nasopharyngeal swab specimens and should not be used as a sole basis for treatment. Nasal washings and aspirates are unacceptable for Xpert Xpress SARS-CoV-2/FLU/RSV testing.  Fact Sheet for Patients: BloggerCourse.com  Fact Sheet for Healthcare Providers: SeriousBroker.it  This test is not yet approved or cleared by the [place] FDA and has been authorized for detection and/or diagnosis of SARS-CoV-2 by FDA under an Emergency Use Authorization (EUA). This EUA will remain in effect (meaning this test can be used) for the duration of the COVID-19 declaration under Section 564(b)(1) of the Act, 21 U.S.C. section 360bbb-3(b)(1), unless the authorization is terminated or revoked.     Resp Syncytial Virus by PCR NEGATIVE NEGATIVE Final    Comment: (NOTE) Fact Sheet for Patients: BloggerCourse.com  Fact Sheet for Healthcare Providers: SeriousBroker.it  This test is not yet approved or cleared by the [place] FDA and has been authorized for detection and/or diagnosis of SARS-CoV-2 by FDA under an Emergency Use Authorization (EUA). This EUA will remain in effect (meaning this test can be used) for the duration of the COVID-19 declaration under Section 564(b)(1) of the Act, 21 U.S.C. section 360bbb-3(b)(1), unless the authorization is terminated or revoked.  Performed at [facility], [address] [place], [place] [address]   Blood Culture (routine x 2)     Status: None   Collection Time:  04/23/23  8:16 PM   Specimen: BLOOD  Result Value Ref Range Status   Specimen Description BLOOD LEFT ANTECUBITAL  Final   Special Requests   Final    BOTTLES DRAWN AEROBIC AND ANAEROBIC Blood Culture results may not be optimal due to an inadequate volume of blood received in culture bottles   Culture   Final    NO GROWTH 5 DAYS Performed at [facility], [address], [place], [place] [address]    Report Status 04/28/2023 FINAL  Final  Blood Culture (routine x 2)     Status: None   Collection Time: 04/23/23  8:21 PM   Specimen: BLOOD  Result Value Ref Range Status   Specimen Description BLOOD RIGHT ANTECUBITAL  Final   Special Requests  Final    BOTTLES DRAWN AEROBIC AND ANAEROBIC Blood Culture adequate volume   Culture   Final    NO GROWTH 5 DAYS Performed at [facility], [address] [place], [place] [address]    Report Status 04/28/2023 FINAL  Final  Respiratory (~20 pathogens) panel by PCR     Status: None   Collection Time: 04/24/23  8:25 AM   Specimen: Nasopharyngeal Swab; Respiratory  Result Value Ref Range Status   Adenovirus NOT DETECTED NOT DETECTED Final   Coronavirus 229E NOT DETECTED NOT DETECTED Final    Comment: (NOTE) The Coronavirus on the Respiratory Panel, DOES NOT test for the novel  Coronavirus (2019 nCoV)    Coronavirus HKU1 NOT DETECTED NOT DETECTED Final   Coronavirus NL63 NOT DETECTED NOT DETECTED Final   Coronavirus OC43 NOT DETECTED NOT DETECTED Final   Metapneumovirus NOT DETECTED NOT DETECTED Final   Rhinovirus / Enterovirus NOT DETECTED NOT DETECTED Final   Influenza A NOT DETECTED NOT DETECTED Final   Influenza B NOT DETECTED NOT DETECTED Final   Parainfluenza Virus 1 NOT DETECTED NOT DETECTED Final   Parainfluenza Virus 2 NOT DETECTED NOT DETECTED Final   Parainfluenza Virus 3 NOT DETECTED NOT DETECTED Final   Parainfluenza Virus 4 NOT DETECTED NOT DETECTED Final   Respiratory Syncytial Virus NOT DETECTED NOT  DETECTED Final   Bordetella pertussis NOT DETECTED NOT DETECTED Final   Bordetella Parapertussis NOT DETECTED NOT DETECTED Final   Chlamydophila pneumoniae NOT DETECTED NOT DETECTED Final   Mycoplasma pneumoniae NOT DETECTED NOT DETECTED Final    Comment: Performed at [facility], [address], [place], [place] [address]  MRSA Next Gen by PCR, Nasal     Status: None   Collection Time: 04/26/23  9:00 AM   Specimen: Nasal Mucosa; Nasal Swab  Result Value Ref Range Status   MRSA by PCR Next Gen NOT DETECTED NOT DETECTED Final    Comment: (NOTE) The GeneXpert MRSA Assay (FDA approved for NASAL specimens only), is one component of a comprehensive MRSA colonization surveillance program. It is not intended to diagnose MRSA infection nor to guide or monitor treatment for MRSA infections. Test performance is not FDA approved in patients less than 45 years old. Performed at [facility], [address] [place], [place] [address]     Labs: CBC: Recent Labs  Lab 04/23/23 2016 04/24/23 0453 04/26/23 0441 04/27/23 0508  WBC 10.4 9.2 10.5 6.4  NEUTROABS 9.1*  --   --   --   HGB 14.0 12.7 11.9* 12.2  HCT 43.2 38.4 36.7 36.7  MCV 91.3 90.8 89.1 90.0  PLT 236 208 226 227   Basic Metabolic Panel: Recent Labs  Lab 04/23/23 2016 04/24/23 0453 04/26/23 0441 04/27/23 0508  NA 135 135 135 137  K 4.0 4.0 4.3 4.2  CL 101 103 98 95*  CO2 23 23 28  32  GLUCOSE 129* 125* 89 222*  BUN 13 10 16 18   CREATININE 0.76 0.58 0.72 0.72  CALCIUM 8.6* 8.5* 8.5* 8.6*   Liver Function Tests: Recent Labs  Lab 04/23/23 2016  AST 23  ALT 15  ALKPHOS 61  BILITOT 0.6  PROT 6.9  ALBUMIN 3.6   CBG: Recent Labs  Lab 04/26/23 1221 04/26/23 1555 04/26/23 2106 04/27/23 0831 04/27/23 1215  GLUCAP 131* 176* 196* 179* 173*    Discharge time spent: greater than 30 minutes.  Signed: Delfino Lovett, MD [facility] 04/28/2023

## 2023-04-30 ENCOUNTER — Emergency Department

## 2023-04-30 ENCOUNTER — Inpatient Hospital Stay
Admission: EM | Admit: 2023-04-30 | Discharge: 2023-05-05 | DRG: 193 | Disposition: A | Discharge status: Home / Self Care | Attending: Internal Medicine | Admitting: Internal Medicine

## 2023-04-30 DIAGNOSIS — J9601 Acute respiratory failure with hypoxia: Secondary | ICD-10-CM | POA: Diagnosis present

## 2023-04-30 DIAGNOSIS — J101 Influenza due to other identified influenza virus with other respiratory manifestations: Secondary | ICD-10-CM | POA: Diagnosis not present

## 2023-04-30 DIAGNOSIS — J309 Allergic rhinitis, unspecified: Secondary | ICD-10-CM | POA: Diagnosis present

## 2023-04-30 DIAGNOSIS — J9621 Acute and chronic respiratory failure with hypoxia: Secondary | ICD-10-CM | POA: Diagnosis present

## 2023-04-30 DIAGNOSIS — K219 Gastro-esophageal reflux disease without esophagitis: Secondary | ICD-10-CM | POA: Diagnosis present

## 2023-04-30 DIAGNOSIS — J441 Chronic obstructive pulmonary disease with (acute) exacerbation: Secondary | ICD-10-CM | POA: Diagnosis not present

## 2023-04-30 DIAGNOSIS — Z7951 Long term (current) use of inhaled steroids: Secondary | ICD-10-CM

## 2023-04-30 DIAGNOSIS — Z7984 Long term (current) use of oral hypoglycemic drugs: Secondary | ICD-10-CM

## 2023-04-30 DIAGNOSIS — F17200 Nicotine dependence, unspecified, uncomplicated: Secondary | ICD-10-CM | POA: Diagnosis present

## 2023-04-30 DIAGNOSIS — Z6828 Body mass index (BMI) 28.0-28.9, adult: Secondary | ICD-10-CM

## 2023-04-30 DIAGNOSIS — E1165 Type 2 diabetes mellitus with hyperglycemia: Secondary | ICD-10-CM | POA: Diagnosis not present

## 2023-04-30 DIAGNOSIS — E663 Overweight: Secondary | ICD-10-CM | POA: Insufficient documentation

## 2023-04-30 DIAGNOSIS — R918 Other nonspecific abnormal finding of lung field: Secondary | ICD-10-CM | POA: Diagnosis present

## 2023-04-30 DIAGNOSIS — E785 Hyperlipidemia, unspecified: Secondary | ICD-10-CM | POA: Diagnosis present

## 2023-04-30 DIAGNOSIS — F17210 Nicotine dependence, cigarettes, uncomplicated: Secondary | ICD-10-CM | POA: Diagnosis present

## 2023-04-30 DIAGNOSIS — G4700 Insomnia, unspecified: Secondary | ICD-10-CM | POA: Diagnosis present

## 2023-04-30 DIAGNOSIS — E119 Type 2 diabetes mellitus without complications: Secondary | ICD-10-CM

## 2023-04-30 DIAGNOSIS — Z860100 Personal history of colon polyps, unspecified: Secondary | ICD-10-CM

## 2023-04-30 DIAGNOSIS — Z716 Tobacco abuse counseling: Secondary | ICD-10-CM

## 2023-04-30 DIAGNOSIS — Z882 Allergy status to sulfonamides status: Secondary | ICD-10-CM

## 2023-04-30 DIAGNOSIS — Z79899 Other long term (current) drug therapy: Secondary | ICD-10-CM

## 2023-04-30 DIAGNOSIS — J9811 Atelectasis: Secondary | ICD-10-CM | POA: Diagnosis present

## 2023-04-30 DIAGNOSIS — Z9981 Dependence on supplemental oxygen: Secondary | ICD-10-CM

## 2023-04-30 DIAGNOSIS — G894 Chronic pain syndrome: Secondary | ICD-10-CM | POA: Diagnosis present

## 2023-04-30 DIAGNOSIS — Z7982 Long term (current) use of aspirin: Secondary | ICD-10-CM

## 2023-04-30 DIAGNOSIS — J439 Emphysema, unspecified: Secondary | ICD-10-CM | POA: Diagnosis present

## 2023-04-30 DIAGNOSIS — I2510 Atherosclerotic heart disease of native coronary artery without angina pectoris: Secondary | ICD-10-CM | POA: Diagnosis present

## 2023-04-30 DIAGNOSIS — Z8616 Personal history of COVID-19: Secondary | ICD-10-CM

## 2023-04-30 DIAGNOSIS — Z888 Allergy status to other drugs, medicaments and biological substances status: Secondary | ICD-10-CM

## 2023-04-30 LAB — CBC WITH DIFFERENTIAL/PLATELET
Abs Immature Granulocytes: 0.12 10*3/uL — ABNORMAL HIGH (ref 0.00–0.07)
Basophils Absolute: 0 10*3/uL (ref 0.0–0.1)
Basophils Relative: 0 %
Eosinophils Absolute: 0.1 10*3/uL (ref 0.0–0.5)
Eosinophils Relative: 0 %
HCT: 42.9 % (ref 36.0–46.0)
Hemoglobin: 13.9 g/dL (ref 12.0–15.0)
Immature Granulocytes: 1 %
Lymphocytes Relative: 25 %
Lymphs Abs: 3.3 10*3/uL (ref 0.7–4.0)
MCH: 28.6 pg (ref 26.0–34.0)
MCHC: 32.4 g/dL (ref 30.0–36.0)
MCV: 88.3 fL (ref 80.0–100.0)
Monocytes Absolute: 0.9 10*3/uL (ref 0.1–1.0)
Monocytes Relative: 7 %
Neutro Abs: 8.9 10*3/uL — ABNORMAL HIGH (ref 1.7–7.7)
Neutrophils Relative %: 67 %
Platelets: 374 10*3/uL (ref 150–400)
RBC: 4.86 MIL/uL (ref 3.87–5.11)
RDW: 15.6 % — ABNORMAL HIGH (ref 11.5–15.5)
WBC: 13.2 10*3/uL — ABNORMAL HIGH (ref 4.0–10.5)
nRBC: 0 % (ref 0.0–0.2)

## 2023-04-30 LAB — RESP PANEL BY RT-PCR (RSV, FLU A&B, COVID)  RVPGX2
Influenza A by PCR: POSITIVE — AB
Influenza B by PCR: NEGATIVE
Resp Syncytial Virus by PCR: NEGATIVE
SARS Coronavirus 2 by RT PCR: NEGATIVE

## 2023-04-30 LAB — CBC
HCT: 39.7 % (ref 36.0–46.0)
Hemoglobin: 13.3 g/dL (ref 12.0–15.0)
MCH: 29.6 pg (ref 26.0–34.0)
MCHC: 33.5 g/dL (ref 30.0–36.0)
MCV: 88.4 fL (ref 80.0–100.0)
Platelets: 332 10*3/uL (ref 150–400)
RBC: 4.49 MIL/uL (ref 3.87–5.11)
RDW: 15.5 % (ref 11.5–15.5)
WBC: 10.4 10*3/uL (ref 4.0–10.5)
nRBC: 0 % (ref 0.0–0.2)

## 2023-04-30 LAB — BASIC METABOLIC PANEL
Anion gap: 11 (ref 5–15)
BUN: 22 mg/dL (ref 8–23)
CO2: 29 mmol/L (ref 22–32)
Calcium: 9 mg/dL (ref 8.9–10.3)
Chloride: 98 mmol/L (ref 98–111)
Creatinine, Ser: 0.7 mg/dL (ref 0.44–1.00)
GFR, Estimated: >60 mL/min (ref >60)
Glucose, Bld: 66 mg/dL — ABNORMAL LOW (ref 70–99)
Potassium: 3.5 mmol/L (ref 3.5–5.1)
Sodium: 138 mmol/L (ref 135–145)

## 2023-04-30 LAB — BRAIN NATRIURETIC PEPTIDE: B Natriuretic Peptide: 16 pg/mL (ref 0.0–100.0)

## 2023-04-30 LAB — CREATININE, SERUM
Creatinine, Ser: 0.88 mg/dL (ref 0.44–1.00)
GFR, Estimated: >60 mL/min (ref >60)

## 2023-04-30 LAB — CBG MONITORING, ED: Glucose-Capillary: 278 mg/dL — ABNORMAL HIGH (ref 70–99)

## 2023-04-30 MED ORDER — BUDESONIDE 0.25 MG/2ML IN SUSP
0.2500 mg | Freq: Two times a day (BID) | RESPIRATORY_TRACT | Status: DC
  Administered 2023-04-30 – 2023-05-01 (×2): 0.25 mg via RESPIRATORY_TRACT
  Filled 2023-04-30 (×3): qty 2

## 2023-04-30 MED ORDER — ACETAMINOPHEN 325 MG PO TABS
650.0000 mg | ORAL_TABLET | Freq: Four times a day (QID) | ORAL | Status: DC | PRN
  Administered 2023-05-01 – 2023-05-03 (×2): 650 mg via ORAL
  Filled 2023-04-30 (×2): qty 2

## 2023-04-30 MED ORDER — DULOXETINE HCL 30 MG PO CPEP
90.0000 mg | ORAL_CAPSULE | Freq: Every day | ORAL | Status: DC
  Administered 2023-05-01 – 2023-05-05 (×5): 90 mg via ORAL
  Filled 2023-04-30: qty 1
  Filled 2023-04-30 (×4): qty 3

## 2023-04-30 MED ORDER — OSELTAMIVIR PHOSPHATE 75 MG PO CAPS
75.0000 mg | ORAL_CAPSULE | Freq: Two times a day (BID) | ORAL | Status: AC
  Administered 2023-04-30 – 2023-05-05 (×10): 75 mg via ORAL
  Filled 2023-04-30 (×11): qty 1

## 2023-04-30 MED ORDER — IPRATROPIUM-ALBUTEROL 0.5-2.5 (3) MG/3ML IN SOLN
3.0000 mL | Freq: Once | RESPIRATORY_TRACT | Status: AC
  Administered 2023-04-30: 3 mL via RESPIRATORY_TRACT
  Filled 2023-04-30: qty 3

## 2023-04-30 MED ORDER — OXYCODONE-ACETAMINOPHEN 5-325 MG PO TABS
1.0000 | ORAL_TABLET | Freq: Four times a day (QID) | ORAL | Status: DC | PRN
  Administered 2023-05-01 – 2023-05-05 (×6): 1 via ORAL
  Filled 2023-04-30 (×6): qty 1

## 2023-04-30 MED ORDER — LORATADINE 10 MG PO TABS
10.0000 mg | ORAL_TABLET | Freq: Every day | ORAL | Status: DC
  Administered 2023-05-01 – 2023-05-05 (×5): 10 mg via ORAL
  Filled 2023-04-30 (×5): qty 1

## 2023-04-30 MED ORDER — METFORMIN HCL ER 500 MG PO TB24
1000.0000 mg | ORAL_TABLET | Freq: Two times a day (BID) | ORAL | Status: DC
  Administered 2023-05-01 – 2023-05-05 (×9): 1000 mg via ORAL
  Filled 2023-04-30 (×12): qty 2

## 2023-04-30 MED ORDER — POLYETHYLENE GLYCOL 3350 17 G PO PACK
17.0000 g | PACK | Freq: Two times a day (BID) | ORAL | Status: DC
  Administered 2023-05-01 – 2023-05-05 (×5): 17 g via ORAL
  Filled 2023-04-30 (×9): qty 1

## 2023-04-30 MED ORDER — ACETAMINOPHEN 650 MG RE SUPP
650.0000 mg | Freq: Four times a day (QID) | RECTAL | Status: DC | PRN
Start: 2023-04-30 — End: 2023-05-05

## 2023-04-30 MED ORDER — ASPIRIN 81 MG PO CHEW
81.0000 mg | CHEWABLE_TABLET | Freq: Every day | ORAL | Status: DC
  Administered 2023-04-30 – 2023-05-05 (×6): 81 mg via ORAL
  Filled 2023-04-30 (×6): qty 1

## 2023-04-30 MED ORDER — SENNOSIDES-DOCUSATE SODIUM 8.6-50 MG PO TABS: 2.0000 | ORAL_TABLET | Freq: Every evening | ORAL | Status: DC | PRN

## 2023-04-30 MED ORDER — ONDANSETRON HCL 4 MG PO TABS: 4.0000 mg | ORAL_TABLET | Freq: Four times a day (QID) | ORAL | Status: DC | PRN

## 2023-04-30 MED ORDER — CYCLOBENZAPRINE HCL 10 MG PO TABS
10.0000 mg | ORAL_TABLET | Freq: Two times a day (BID) | ORAL | Status: DC | PRN
  Administered 2023-05-01 – 2023-05-03 (×2): 10 mg via ORAL
  Filled 2023-04-30 (×3): qty 1

## 2023-04-30 MED ORDER — INSULIN ASPART 100 UNIT/ML IJ SOLN
0.0000 [IU] | Freq: Every day | INTRAMUSCULAR | Status: DC
Start: 2023-04-30 — End: 2023-05-05
  Administered 2023-04-30: 3 [IU] via SUBCUTANEOUS
  Administered 2023-05-01 – 2023-05-04 (×2): 2 [IU] via SUBCUTANEOUS
  Filled 2023-04-30 (×3): qty 1

## 2023-04-30 MED ORDER — LINACLOTIDE 72 MCG PO CAPS
72.0000 ug | ORAL_CAPSULE | Freq: Every morning | ORAL | Status: DC
Start: 2023-05-01 — End: 2023-05-05
  Administered 2023-05-02 – 2023-05-05 (×4): 72 ug via ORAL
  Filled 2023-04-30 (×5): qty 1

## 2023-04-30 MED ORDER — OXYCODONE HCL ER 15 MG PO T12A
30.0000 mg | EXTENDED_RELEASE_TABLET | Freq: Two times a day (BID) | ORAL | Status: DC
  Administered 2023-04-30 – 2023-05-05 (×10): 30 mg via ORAL
  Filled 2023-04-30 (×10): qty 2

## 2023-04-30 MED ORDER — METHYLPREDNISOLONE SODIUM SUCC 125 MG IJ SOLR
125.0000 mg | Freq: Once | INTRAMUSCULAR | Status: AC
  Administered 2023-04-30: 125 mg via INTRAVENOUS
  Filled 2023-04-30: qty 2

## 2023-04-30 MED ORDER — ENOXAPARIN SODIUM 40 MG/0.4ML IJ SOSY
40.0000 mg | PREFILLED_SYRINGE | INTRAMUSCULAR | Status: DC
  Administered 2023-04-30 – 2023-05-04 (×5): 40 mg via SUBCUTANEOUS
  Filled 2023-04-30 (×6): qty 0.4

## 2023-04-30 MED ORDER — PANTOPRAZOLE SODIUM 40 MG PO TBEC
40.0000 mg | DELAYED_RELEASE_TABLET | Freq: Every day | ORAL | Status: DC
  Administered 2023-05-01 – 2023-05-05 (×5): 40 mg via ORAL
  Filled 2023-04-30 (×5): qty 1

## 2023-04-30 MED ORDER — LINAGLIPTIN 5 MG PO TABS
5.0000 mg | ORAL_TABLET | Freq: Every day | ORAL | Status: DC
  Administered 2023-05-01 – 2023-05-05 (×5): 5 mg via ORAL
  Filled 2023-04-30 (×5): qty 1

## 2023-04-30 MED ORDER — ONDANSETRON HCL 4 MG/2ML IJ SOLN: 4.0000 mg | Freq: Four times a day (QID) | INTRAMUSCULAR | Status: DC | PRN

## 2023-04-30 MED ORDER — DOXYCYCLINE HYCLATE 100 MG PO TABS
100.0000 mg | ORAL_TABLET | Freq: Two times a day (BID) | ORAL | Status: AC
  Administered 2023-04-30 – 2023-05-02 (×4): 100 mg via ORAL
  Filled 2023-04-30 (×4): qty 1

## 2023-04-30 MED ORDER — METHYLPREDNISOLONE SODIUM SUCC 40 MG IJ SOLR
40.0000 mg | Freq: Two times a day (BID) | INTRAMUSCULAR | Status: DC
  Administered 2023-04-30 – 2023-05-02 (×4): 40 mg via INTRAVENOUS
  Filled 2023-04-30 (×5): qty 1

## 2023-04-30 MED ORDER — NICOTINE 21 MG/24HR TD PT24
21.0000 mg | MEDICATED_PATCH | TRANSDERMAL | Status: DC
  Administered 2023-05-01 – 2023-05-02 (×2): 21 mg via TRANSDERMAL
  Filled 2023-04-30 (×4): qty 1

## 2023-04-30 MED ORDER — INSULIN ASPART 100 UNIT/ML IJ SOLN
0.0000 [IU] | Freq: Three times a day (TID) | INTRAMUSCULAR | Status: DC
  Administered 2023-05-01 – 2023-05-04 (×5): 1 [IU] via SUBCUTANEOUS
  Filled 2023-04-30 (×5): qty 1

## 2023-04-30 MED ORDER — IPRATROPIUM-ALBUTEROL 0.5-2.5 (3) MG/3ML IN SOLN
3.0000 mL | Freq: Four times a day (QID) | RESPIRATORY_TRACT | Status: DC
  Administered 2023-04-30 – 2023-05-05 (×16): 3 mL via RESPIRATORY_TRACT
  Filled 2023-04-30 (×18): qty 3

## 2023-04-30 NOTE — ED Notes (Signed)
 CCMD called and pt placed on cardiac monitoring.

## 2023-04-30 NOTE — ED Provider Triage Note (Signed)
 Emergency Medicine Provider Triage Evaluation Note  Laurie Berg , a 63 y.o. female  was evaluated in triage.  Pt complains of SOB, patient was recently discharged from the hospital on home O2. Patient doesn't feel like she has gotten any better since discharge.  Review of Systems  Positive: sob Negative: pain  Physical Exam  There were no vitals taken for this visit. Gen:   Awake, no distress   Resp:  Normal effort  MSK:   Moves extremities without difficulty  Other:    Medical Decision Making  Medically screening exam initiated at 2:35 PM.  Appropriate orders placed.  Laurie Berg was informed that the remainder of the evaluation will be completed by another provider, this initial triage assessment does not replace that evaluation, and the importance of remaining in the ED until their evaluation is complete.     Cameron Ali, PA-C 04/30/23 1438

## 2023-04-30 NOTE — H&P (Addendum)
 History and Physical    Laurie Berg:[id number] DOB: 28-Dec-1960 DOA: 04/30/2023  PCP: Leanna Sato, MD   Patient coming from: Home  Chief Complaint: Worsening shortness of breath.  HPI: Laurie Berg is a 63 y.o. female with medical history significant for CAD, type 2 diabetes, and COPD with chronic hypoxemia recently discharged after hospitalization for acute on chronic hypoxemic respiratory failure with acute decompensated COPD on 3/14.  At that time, she was treated for her COPD exacerbation and was thought to have a possible viral syndrome, however COVID, flu, RSV and RVP testing were all negative.  Since discharge, she has had worsening shortness of breath and cough and is now noted to be influenza A positive.  She denies any current fevers or chills or chest pain.  She is noted to have a dry cough with no sputum production.  She states that she has stopped smoking since her recent admission.   ED Course: Vital signs stable and patient on 4 L nasal cannula without any acute respiratory distress or tachypnea noted.  Positive for influenza A.  Chest x-ray with emphysema no other acute findings.  Leukocytosis of 13,200 noted and glucose 66.  Daughter at bedside.  Review of Systems: Reviewed as noted above, otherwise negative.  Past Medical History:  Diagnosis Date   Cervical radiculopathy    Chronic pain syndrome    Colon polyps    COPD (chronic obstructive pulmonary disease) (HCC)    Coronary artery calcification seen on CT scan    a. 04/2021 High Res CT Chest: Ao atherosclerosis, cor Ca2+; b. 07/2021 MV: EF 69%, no isch/infarct, LAD/LCX Ca2+ noted; c. 02/2022 CT Chest: Cor and Ao atherosclerosis.   COVID-19 virus infection    Diabetes mellitus without complication (HCC)    History of echocardiogram    a. 08/2021 Echo: EF 55-60%, no rwma, nl RV fxn.   Lung nodule    a. 02/2022 CT Chest: RLL 7mm solid subpleural pulm nodule, prev 68mm-->rec thoracic surgery eval.   Sepsis (HCC)     Tobacco abuse     Past Surgical History:  Procedure Laterality Date   CARPAL TUNNEL RELEASE     COLONOSCOPY WITH PROPOFOL N/A 10/21/2019   Procedure: COLONOSCOPY WITH PROPOFOL;  Surgeon: Pasty Spillers, MD;  Location: [facility] ENDOSCOPY;  Service: Endoscopy;  Laterality: N/A;   DILATION AND CURETTAGE OF UTERUS     TUBAL LIGATION       reports that she has been smoking cigarettes. She has a 84 pack-year smoking history. She has never used smokeless tobacco. She reports that she does not currently use alcohol. She reports that she does not use drugs.  Allergies  Allergen Reactions   Pregabalin Shortness Of Breath and Swelling    "Felt like I couldn't breathe and my tongue was swelling up"   Sulfamethoxazole-Trimethoprim Itching    No family history on file.  Prior to Admission medications   Medication Sig Start Date End Date Taking? Authorizing Provider  albuterol (VENTOLIN HFA) 108 (90 Base) MCG/ACT inhaler Inhale 1 puff into the lungs as needed. 09/11/19   [provider]  aspirin 81 MG chewable tablet Chew 1 tablet (81 mg total) by mouth daily. 03/22/22   Darlin Priestly, MD  Aspirin-Salicylamide-Caffeine [facility] HEADACHE POWDER PO) Take 1 packet by mouth as needed (pain).    [provider]  atorvastatin (LIPITOR) 20 MG tablet Take 1 tablet (20 mg total) by mouth daily. 04/26/22   Debbe Odea, MD  Blood  Glucose Monitoring Suppl DEVI 1 each by Does not apply route in the morning, at noon, and at bedtime. May substitute to any manufacturer covered by patient's insurance. 03/21/22   Darlin Priestly, MD  cetirizine (ZYRTEC) 10 MG tablet Take 10 mg by mouth daily. 09/25/19   [provider]  cyclobenzaprine (FLEXERIL) 10 MG tablet Take 10 mg by mouth 2 (two) times daily as needed for muscle spasms. 09/19/21   [provider]  doxycycline (VIBRA-TABS) 100 MG tablet Take 1 tablet (100 mg total) by mouth every 12 (twelve) hours for 5 days. 04/27/23 05/02/23  Delfino Lovett, MD  DULoxetine (CYMBALTA) 30 MG capsule Take 90 mg by mouth daily.    [provider]  guaiFENesin (MUCINEX) 600 MG 12 hr tablet Take 1 tablet (600 mg total) by mouth 2 (two) times daily for 21 days. 04/27/23 05/18/23  Delfino Lovett, MD  ipratropium-albuterol (DUONEB) 0.5-2.5 (3) MG/3ML SOLN Take 3 mLs by nebulization every 6 (six) hours as needed. 03/21/22   Darlin Priestly, MD  LINZESS 72 MCG capsule Take 72 mcg by mouth every morning. 03/22/21   [provider]  metFORMIN (GLUCOPHAGE-XR) 500 MG 24 hr tablet Take 2 tablets (1,000 mg total) by mouth 2 (two) times daily with a meal. 04/27/23 07/26/23  Delfino Lovett, MD  Multiple Vitamin (MULTIVITAMIN WITH MINERALS) TABS tablet Take 1 tablet by mouth daily. 03/22/22   Darlin Priestly, MD  naloxone [facility]) nasal spray 4 mg/0.1 mL One spray in either nostril once for known/suspected opioid overdose. May repeat every 2-3 minutes in alternating nostril til EMS arrives 12/13/21   [provider]  nicotine (NICODERM CQ - DOSED IN MG/24 HOURS) 21 mg/24hr patch Place 1 patch (21 mg total) onto the skin daily for 14 days. 04/27/23 05/11/23  Delfino Lovett, MD  omeprazole (PRILOSEC) 40 MG capsule Take 40 mg by mouth daily. 03/27/22   [provider]  oxyCODONE-acetaminophen (PERCOCET/ROXICET) 5-325 MG tablet Take 1 tablet by mouth every 6 (six) hours as needed.    [provider]  OXYCONTIN 30 MG 12 hr tablet Take 1 tablet by mouth 2 (two) times daily. 06/26/21   [provider]  polyethylene glycol (MIRALAX / GLYCOLAX) 17 g packet Take 17 g by mouth 2 (two) times daily. 04/27/23   Delfino Lovett, MD  predniSONE (DELTASONE) 10 MG tablet Take 6 tablets (60 mg total) by mouth daily for 1 day, THEN 5 tablets (50 mg total) daily for 1 day, THEN 4 tablets (40 mg total) daily for 1 day, THEN 3 tablets (30 mg total) daily for 1 day, THEN 2 tablets (20 mg total) daily for 1 day, THEN 1 tablet (10 mg total) daily for 1 day. 04/27/23 05/03/23  Delfino Lovett, MD  senna-docusate (SENOKOT-S) 8.6-50 MG tablet Take 2 tablets by mouth at bedtime as needed for mild constipation. 04/27/23 07/26/23  Delfino Lovett, MD  TRADJENTA 5 MG TABS tablet Take 5 mg by mouth daily.    [provider]  Dwyane Luo 100-62.5-25 MCG/ACT AEPB Take 1 puff by mouth daily. 07/04/21   [provider]    Physical Exam: Vitals:   04/30/23 1436 04/30/23 1437 04/30/23 1604 04/30/23 1730  BP: 110/77  116/83 (!) 142/91  Pulse: 85  91 81  Resp: (!) 21  19 16   Temp: 98 F (36.7 C)     TempSrc: Oral     SpO2: (!) 88%  96% 97%  Weight:  75.8 kg    Height:  5\' 4"  (1.626 m)      Constitutional: NAD, calm, comfortable Vitals:   04/30/23 1436 04/30/23 1437 04/30/23 1604 04/30/23 1730  BP: 110/77  116/83 (!) 142/91  Pulse: 85  91 81  Resp: (!) 21  19 16   Temp: 98 F (36.7 C)     TempSrc: Oral     SpO2: (!) 88%  96% 97%  Weight:  75.8 kg    Height:  5\' 4"  (1.626 m)     Eyes: lids and conjunctivae normal Neck: normal, supple Respiratory: Minimal wheezing bilaterally.  Normal respiratory effort. No accessory muscle use.  4 L nasal cannula. Cardiovascular: Regular rate and rhythm, no murmurs. Abdomen: no tenderness, no distention. Bowel sounds positive.  Musculoskeletal:  No edema. Skin: no rashes, lesions, ulcers.  Psychiatric: Flat affect  Labs on Admission: I have personally reviewed following labs and imaging studies  CBC: Recent Labs  Lab 04/23/23 2016 04/24/23 0453 04/26/23 0441 04/27/23 0508 04/30/23 1438  WBC 10.4 9.2 10.5 6.4 13.2*  NEUTROABS 9.1*  --   --   --  8.9*  HGB 14.0 12.7 11.9* 12.2 13.9  HCT 43.2 38.4 36.7 36.7 42.9  MCV 91.3 90.8 89.1 90.0 88.3  PLT 236 208 226 227 374   Basic Metabolic Panel: Recent Labs  Lab 04/23/23 2016 04/24/23 0453 04/26/23 0441 04/27/23 0508 04/30/23 1438  NA 135 135 135 137 138  K 4.0 4.0 4.3 4.2 3.5  CL 101 103 98 95* 98  CO2 23 23 28  32 29  GLUCOSE 129* 125* 89 222* 66*  BUN  13 10 16 18 22   CREATININE 0.76 0.58 0.72 0.72 0.70  CALCIUM 8.6* 8.5* 8.5* 8.6* 9.0   GFR: Estimated Creatinine Clearance: 73.6 mL/min (by C-G formula based on SCr of 0.7 mg/dL). Liver Function Tests: Recent Labs  Lab 04/23/23 2016  AST 23  ALT 15  ALKPHOS 61  BILITOT 0.6  PROT 6.9  ALBUMIN 3.6   No results for input(s): "LIPASE", "AMYLASE" in the last 168 hours. No results for input(s): "AMMONIA" in the last 168 hours. Coagulation Profile: Recent Labs  Lab 04/23/23 2016 04/24/23 0453  INR 1.0 1.1   Cardiac Enzymes: No results for input(s): "CKTOTAL", "CKMB", "CKMBINDEX", "TROPONINI" in the last 168 hours. BNP (last 3 results) No results for input(s): "PROBNP" in the last 8760 hours. HbA1C: No results for input(s): "HGBA1C" in the last 72 hours. CBG: Recent Labs  Lab 04/26/23 1221 04/26/23 1555 04/26/23 2106 04/27/23 0831 04/27/23 1215  GLUCAP 131* 176* 196* 179* 173*   Lipid Profile: No results for input(s): "CHOL", "HDL", "LDLCALC", "TRIG", "CHOLHDL", "LDLDIRECT" in the last 72 hours. Thyroid Function Tests: No results for input(s): "TSH", "T4TOTAL", "FREET4", "T3FREE", "THYROIDAB" in the last 72 hours. Anemia Panel: No results for input(s): "VITAMINB12", "FOLATE", "FERRITIN", "TIBC", "IRON", "RETICCTPCT" in the last 72 hours. Urine analysis:    Component Value Date/Time   COLORURINE YELLOW (A) 04/23/2023 2228   APPEARANCEUR CLEAR (A) 04/23/2023 2228   LABSPEC 1.016 04/23/2023 2228   PHURINE 5.0 04/23/2023 2228   GLUCOSEU NEGATIVE 04/23/2023 2228   HGBUR MODERATE (A) 04/23/2023 2228   BILIRUBINUR NEGATIVE 04/23/2023 2228   KETONESUR NEGATIVE 04/23/2023 2228   PROTEINUR 100 (A) 04/23/2023 2228   NITRITE NEGATIVE 04/23/2023 2228   LEUKOCYTESUR NEGATIVE 04/23/2023 2228    Radiological Exams on Admission: DG Chest 2 View Result Date: 04/30/2023 CLINICAL DATA:  Short of breath, COPD EXAM: CHEST - 2 VIEW COMPARISON:  04/27/2023 FINDINGS: Frontal and  lateral views of the chest demonstrate a stable cardiac silhouette. Stable hyperinflation and background interstitial scarring consistent with emphysema. The patchy consolidation seen on prior study has near completely resolved in the interim, with no airspace disease, effusion, or pneumothorax identified on today's exam. No acute bony abnormalities. IMPRESSION: 1. Emphysema, with interval resolution of the patchy upper lobe predominant airspace disease seen previously. Electronically Signed   By: Sharlet Salina M.D.   On: 04/30/2023 16:22    EKG: Independently reviewed.  NSR 85 bpm.  Assessment/Plan Principal Problem:   COPD with acute exacerbation (HCC) Active Problems:   Dyslipidemia   Tobacco use disorder   Coronary artery disease   GERD without esophagitis   Type 2 diabetes mellitus without complications (HCC)    Acute COPD exacerbation secondary to influenza A infection in the setting of chronic hypoxemia -Baseline oxygen requirement around 3 L nasal cannula and currently on 4 L without any increased work of breathing or tachypnea -Continue Solu-Medrol IV twice daily as well as Pulmicort twice daily -Tamiflu and droplet precautions -DuoNebs every 6 hours and additionally for shortness of breath or wheezing -Leukocytosis likely related to this presentation, continue to monitor  -Chest x-ray with no concerning findings -Finish 5 day course of doxycycline, 2 more days left (from prior admission) -Anticipate quick discharge in next 1-2 days and will need close follow-up with her pulmonologist  Dyslipidemia -Continue statin  Type 2 diabetes with noted mild hypoglycemia -Continue home metformin and Tradjenta -Carb modified diet and SSI  GERD -PPI  History of constipation -Likely in the setting of chronic narcotics, continue bowel regimen  History of chronic pain -Continue home medications  History of tobacco abuse -States she recently has stopped smoking in the last 1 week  since recent hospitalization   DVT prophylaxis: Lovenox Code Status: Full Family Communication: 2 daughters at bedside 3/17 Disposition Plan: Admit for COPD exacerbation Consults called: None Admission status: Observation, telemetry  Severity of Illness: The appropriate patient status for this patient is OBSERVATION. Observation status is judged to be reasonable and necessary in order to provide the required intensity of service to ensure the patient's safety. The patient's presenting symptoms, physical exam findings, and initial radiographic and laboratory data in the context of their medical condition is felt to place them at decreased risk for further clinical deterioration. Furthermore, it is anticipated that the patient will be medically stable for discharge from the hospital within 2 midnights of admission.    Riane Rung D Sherryll Burger DO [facility]  If 7PM-7AM, please contact night-coverage [email or web address]  04/30/2023, 6:29 PM

## 2023-04-30 NOTE — ED Notes (Signed)
 Report given to [facility] via phone call. Pt transitioning into the surge pod in the ED. Pt updated on transition of care.

## 2023-04-30 NOTE — ED Notes (Signed)
 This RN received report from [facility] and performed bedside care handoff. This RN introduced self to pt. Call light in reach, bed wheels locked, side rail raised, pt updated on plan of care. Rounding completed. Family bedside. Patient Access speaking with family.

## 2023-04-30 NOTE — ED Provider Notes (Signed)
 [facility] Provider Note    Event Date/Time   First MD Initiated Contact with Patient 04/30/23 1608     (approximate)   History   Shortness of Breath   HPI  Baylynn A Rayborn is a 63 y.o. female with a history of CAD, type 2 diabetes, COPD on 2 L home O2 who presents with persistent shortness of breath since leaving the hospital 3 days ago, present at rest but worse with exertion and associated with low oxygen levels.  The patient is on 4 L of O2 chronically.  She states that over the last several days her oxygen level on 4 L has often been in the 80s.  When she walks around or exerts herself minimally it will drop as low as the 60s and then take a long time to come back up.  She reports persistent cough productive of yellowish sputum.  She denies any fever.  She has no chest pain or leg swelling.  She states that she has not smoked since she left the hospital.  I reviewed the past medical records.  The patient was just admitted to the hospitalist service for a COPD exacerbation and sepsis due to pneumonia, discharged on 3/14.  The patient reports that she talked to the nurse from her pulmonologist today and was instructed to come back to the hospital due to her persistent hypoxia.   Physical Exam   Triage Vital Signs: ED Triage Vitals  Encounter Vitals Group     BP 04/30/23 1436 110/77     Systolic BP Percentile --      Diastolic BP Percentile --      Pulse Rate 04/30/23 1436 85     Resp 04/30/23 1436 (!) 21     Temp 04/30/23 1436 98 F (36.7 C)     Temp Source 04/30/23 1436 Oral     SpO2 04/30/23 1436 (!) 88 %     Weight 04/30/23 1437 167 lb 1.7 oz (75.8 kg)     Height 04/30/23 1437 5\' 4"  (1.626 m)     Head Circumference --      Peak Flow --      Pain Score 04/30/23 1436 0     Pain Loc --      Pain Education --      Exclude from Growth Chart --     Most recent vital signs: Vitals:   04/30/23 2000 04/30/23 2210  BP: 128/85 136/89  Pulse: 93  (!) 101  Resp: 17 18  Temp:  98.3 F (36.8 C)  SpO2: 93% 91%     General: Alert, chronically ill-appearing, no distress.  CV:  Good peripheral perfusion.  Resp:  Increased respiratory effort.  Diffuse wheezing bilaterally. Abd:  No distention.  Other:  No peripheral edema.   ED Results / Procedures / Treatments   Labs (all labs ordered are listed, but only abnormal results are displayed) Labs Reviewed  RESP PANEL BY RT-PCR (RSV, FLU A&B, COVID)  RVPGX2 - Abnormal; Notable for the following components:      Result Value   Influenza A by PCR POSITIVE (*)    All other components within normal limits  BASIC METABOLIC PANEL - Abnormal; Notable for the following components:   Glucose, Bld 66 (*)    All other components within normal limits  CBC WITH DIFFERENTIAL/PLATELET - Abnormal; Notable for the following components:   WBC 13.2 (*)    RDW 15.6 (*)    Neutro Abs 8.9 (*)  Abs Immature Granulocytes 0.12 (*)    All other components within normal limits  CBG MONITORING, ED - Abnormal; Notable for the following components:   Glucose-Capillary 278 (*)    All other components within normal limits  BRAIN NATRIURETIC PEPTIDE  CBC  CREATININE, SERUM  MAGNESIUM  BASIC METABOLIC PANEL  CBC     EKG  ED ECG REPORT I, Dionne Bucy, the attending physician, personally viewed and interpreted this ECG.  Date: 04/30/2023 EKG Time: 1440 Rate: 85 Rhythm: normal sinus rhythm QRS Axis: normal Intervals: normal ST/T Wave abnormalities: Nonspecific T wave abnormality Narrative Interpretation: no evidence of acute ischemia    RADIOLOGY  Chest x-ray: I independently viewed and interpreted the images; there is no focal consolidation or edema   PROCEDURES:  Critical Care performed: No  Procedures   MEDICATIONS ORDERED IN ED: Medications  aspirin chewable tablet 81 mg (81 mg Oral Given 04/30/23 2100)  oxyCODONE-acetaminophen (PERCOCET/ROXICET) 5-325 MG per tablet 1  tablet (has no administration in time range)  oxyCODONE (OXYCONTIN) 12 hr tablet 30 mg (has no administration in time range)  DULoxetine (CYMBALTA) DR capsule 90 mg (has no administration in time range)  nicotine (NICODERM CQ - dosed in mg/24 hours) patch 21 mg (21 mg Transdermal Not Given 04/30/23 2101)  metFORMIN (GLUCOPHAGE-XR) 24 hr tablet 1,000 mg (has no administration in time range)  linagliptin (TRADJENTA) tablet 5 mg (has no administration in time range)  linaclotide (LINZESS) capsule 72 mcg (has no administration in time range)  pantoprazole (PROTONIX) EC tablet 40 mg (has no administration in time range)  senna-docusate (Senokot-S) tablet 2 tablet (has no administration in time range)  polyethylene glycol (MIRALAX / GLYCOLAX) packet 17 g (17 g Oral Not Given 04/30/23 2204)  cyclobenzaprine (FLEXERIL) tablet 10 mg (has no administration in time range)  loratadine (CLARITIN) tablet 10 mg (has no administration in time range)  enoxaparin (LOVENOX) injection 40 mg (40 mg Subcutaneous Given 04/30/23 2105)  acetaminophen (TYLENOL) tablet 650 mg (has no administration in time range)    Or  acetaminophen (TYLENOL) suppository 650 mg (has no administration in time range)  ondansetron (ZOFRAN) tablet 4 mg (has no administration in time range)    Or  ondansetron (ZOFRAN) injection 4 mg (has no administration in time range)  methylPREDNISolone sodium succinate (SOLU-MEDROL) 40 mg/mL injection 40 mg (has no administration in time range)  budesonide (PULMICORT) nebulizer solution 0.25 mg (0.25 mg Nebulization Given 04/30/23 2101)  ipratropium-albuterol (DUONEB) 0.5-2.5 (3) MG/3ML nebulizer solution 3 mL (3 mLs Nebulization Given 04/30/23 2102)  oseltamivir (TAMIFLU) capsule 75 mg (75 mg Oral Given 04/30/23 2212)  insulin aspart (novoLOG) injection 0-5 Units (3 Units Subcutaneous Given 04/30/23 2212)  insulin aspart (novoLOG) injection 0-6 Units (has no administration in time range)  doxycycline  (VIBRA-TABS) tablet 100 mg (has no administration in time range)  ipratropium-albuterol (DUONEB) 0.5-2.5 (3) MG/3ML nebulizer solution 3 mL (3 mLs Nebulization Given 04/30/23 1728)  ipratropium-albuterol (DUONEB) 0.5-2.5 (3) MG/3ML nebulizer solution 3 mL (3 mLs Nebulization Given 04/30/23 1727)  methylPREDNISolone sodium succinate (SOLU-MEDROL) 125 mg/2 mL injection 125 mg (125 mg Intravenous Given 04/30/23 1727)     IMPRESSION / MDM / ASSESSMENT AND PLAN / ED COURSE  I reviewed the triage vital signs and the nursing notes.  63 year old female with PMH as noted above presents with persistent shortness of breath, hypoxia, and increased oxygen requirement over the last 3 days since being discharged from the hospital for COPD exacerbation.  On exam her O2 saturation  currently is in the mid 90s on her baseline 4 L although she reports desaturation even with minimal exertion.  Other vital signs are normal.  Lung exam reveals diffuse wheezing bilaterally.  Differential diagnosis includes, but is not limited to, COPD exacerbation, worsening of chronic COPD/respiratory failure, acute bronchitis, new or worsening pneumonia, less likely cardiac etiology.  CBC shows mild leukocytosis.  BMP is unremarkable.  BNP is negative.  I ordered a respiratory panel, chest x-ray, steroid, DuoNebs, and we will reassess.  Patient's presentation is most consistent with acute presentation with potential threat to life or bodily function.  The patient is on the cardiac monitor to evaluate for evidence of arrhythmia and/or significant heart rate changes.   ----------------------------------------- 6:30 PM on 04/30/2023 -----------------------------------------  Chest x-ray shows resolved opacity and no new findings.  Respiratory panel is positive for influenza A.  Overall I suspect that the patient is having a COPD exacerbation likely worsened by influenza which is new since last week.  Given her recurrent hypoxia at home  and worsening symptoms she will need admission for further management.  I consulted Dr. Sherryll Burger from the hospitalist service; based on our discussion he agrees to evaluate the patient for admission.  FINAL CLINICAL IMPRESSION(S) / ED DIAGNOSES   Final diagnoses:  COPD exacerbation (HCC)  Influenza A     Rx / DC Orders   ED Discharge Orders     None        Note:  This document was prepared using Dragon voice recognition software and may include unintentional dictation errors.    Dionne Bucy, MD 04/30/23 2230

## 2023-04-30 NOTE — ED Triage Notes (Signed)
 Pt here via [facility] from home with SOB. Pt was recently admitted for COPD and came home Friday. Pt did not feel better when discharged, continues with same complaints. Pt was discharged on 4L, new for her. Pt states she is SOB all the time but worse with exertion.   92% 4L 90 149/87 93-cbg

## 2023-05-01 ENCOUNTER — Other Ambulatory Visit: Payer: Self-pay

## 2023-05-01 ENCOUNTER — Encounter: Payer: Self-pay | Admitting: Internal Medicine

## 2023-05-01 DIAGNOSIS — I2510 Atherosclerotic heart disease of native coronary artery without angina pectoris: Secondary | ICD-10-CM

## 2023-05-01 DIAGNOSIS — Z882 Allergy status to sulfonamides status: Secondary | ICD-10-CM | POA: Diagnosis not present

## 2023-05-01 DIAGNOSIS — Z794 Long term (current) use of insulin: Secondary | ICD-10-CM

## 2023-05-01 DIAGNOSIS — J101 Influenza due to other identified influenza virus with other respiratory manifestations: Secondary | ICD-10-CM

## 2023-05-01 DIAGNOSIS — E663 Overweight: Secondary | ICD-10-CM | POA: Insufficient documentation

## 2023-05-01 DIAGNOSIS — J441 Chronic obstructive pulmonary disease with (acute) exacerbation: Secondary | ICD-10-CM | POA: Diagnosis present

## 2023-05-01 DIAGNOSIS — Z7982 Long term (current) use of aspirin: Secondary | ICD-10-CM | POA: Diagnosis not present

## 2023-05-01 DIAGNOSIS — E1165 Type 2 diabetes mellitus with hyperglycemia: Secondary | ICD-10-CM | POA: Diagnosis not present

## 2023-05-01 DIAGNOSIS — K219 Gastro-esophageal reflux disease without esophagitis: Secondary | ICD-10-CM

## 2023-05-01 DIAGNOSIS — Z7984 Long term (current) use of oral hypoglycemic drugs: Secondary | ICD-10-CM | POA: Diagnosis not present

## 2023-05-01 DIAGNOSIS — G894 Chronic pain syndrome: Secondary | ICD-10-CM | POA: Diagnosis present

## 2023-05-01 DIAGNOSIS — E785 Hyperlipidemia, unspecified: Secondary | ICD-10-CM | POA: Diagnosis present

## 2023-05-01 DIAGNOSIS — J9601 Acute respiratory failure with hypoxia: Secondary | ICD-10-CM | POA: Diagnosis present

## 2023-05-01 DIAGNOSIS — Z860100 Personal history of colon polyps, unspecified: Secondary | ICD-10-CM | POA: Diagnosis not present

## 2023-05-01 DIAGNOSIS — J439 Emphysema, unspecified: Secondary | ICD-10-CM | POA: Diagnosis present

## 2023-05-01 DIAGNOSIS — R918 Other nonspecific abnormal finding of lung field: Secondary | ICD-10-CM | POA: Diagnosis present

## 2023-05-01 DIAGNOSIS — E119 Type 2 diabetes mellitus without complications: Secondary | ICD-10-CM

## 2023-05-01 DIAGNOSIS — J9811 Atelectasis: Secondary | ICD-10-CM | POA: Diagnosis present

## 2023-05-01 DIAGNOSIS — F17210 Nicotine dependence, cigarettes, uncomplicated: Secondary | ICD-10-CM | POA: Diagnosis present

## 2023-05-01 DIAGNOSIS — Z888 Allergy status to other drugs, medicaments and biological substances status: Secondary | ICD-10-CM | POA: Diagnosis not present

## 2023-05-01 DIAGNOSIS — G4700 Insomnia, unspecified: Secondary | ICD-10-CM | POA: Diagnosis present

## 2023-05-01 DIAGNOSIS — J309 Allergic rhinitis, unspecified: Secondary | ICD-10-CM | POA: Diagnosis present

## 2023-05-01 DIAGNOSIS — Z9981 Dependence on supplemental oxygen: Secondary | ICD-10-CM | POA: Diagnosis not present

## 2023-05-01 DIAGNOSIS — J9621 Acute and chronic respiratory failure with hypoxia: Secondary | ICD-10-CM | POA: Diagnosis present

## 2023-05-01 DIAGNOSIS — Z8616 Personal history of COVID-19: Secondary | ICD-10-CM | POA: Diagnosis not present

## 2023-05-01 DIAGNOSIS — F17200 Nicotine dependence, unspecified, uncomplicated: Secondary | ICD-10-CM

## 2023-05-01 DIAGNOSIS — Z6828 Body mass index (BMI) 28.0-28.9, adult: Secondary | ICD-10-CM | POA: Diagnosis not present

## 2023-05-01 DIAGNOSIS — Z7951 Long term (current) use of inhaled steroids: Secondary | ICD-10-CM | POA: Diagnosis not present

## 2023-05-01 LAB — GLUCOSE, CAPILLARY
Glucose-Capillary: 111 mg/dL — ABNORMAL HIGH (ref 70–99)
Glucose-Capillary: 174 mg/dL — ABNORMAL HIGH (ref 70–99)
Glucose-Capillary: 245 mg/dL — ABNORMAL HIGH (ref 70–99)

## 2023-05-01 LAB — CBC
HCT: 40.1 % (ref 36.0–46.0)
Hemoglobin: 13.2 g/dL (ref 12.0–15.0)
MCH: 29.3 pg (ref 26.0–34.0)
MCHC: 32.9 g/dL (ref 30.0–36.0)
MCV: 88.9 fL (ref 80.0–100.0)
Platelets: 356 10*3/uL (ref 150–400)
RBC: 4.51 MIL/uL (ref 3.87–5.11)
RDW: 15.5 % (ref 11.5–15.5)
WBC: 9.9 10*3/uL (ref 4.0–10.5)
nRBC: 0 % (ref 0.0–0.2)

## 2023-05-01 LAB — CBG MONITORING, ED: Glucose-Capillary: 150 mg/dL — ABNORMAL HIGH (ref 70–99)

## 2023-05-01 LAB — BASIC METABOLIC PANEL
Anion gap: 11 (ref 5–15)
BUN: 18 mg/dL (ref 8–23)
CO2: 26 mmol/L (ref 22–32)
Calcium: 9.3 mg/dL (ref 8.9–10.3)
Chloride: 97 mmol/L — ABNORMAL LOW (ref 98–111)
Creatinine, Ser: 0.79 mg/dL (ref 0.44–1.00)
GFR, Estimated: >60 mL/min (ref >60)
Glucose, Bld: 232 mg/dL — ABNORMAL HIGH (ref 70–99)
Potassium: 4 mmol/L (ref 3.5–5.1)
Sodium: 134 mmol/L — ABNORMAL LOW (ref 135–145)

## 2023-05-01 LAB — MAGNESIUM: Magnesium: 1.9 mg/dL (ref 1.7–2.4)

## 2023-05-01 NOTE — Assessment & Plan Note (Signed)
 Nicotine patch

## 2023-05-01 NOTE — Assessment & Plan Note (Signed)
 On Protonix

## 2023-05-01 NOTE — Assessment & Plan Note (Signed)
 On sliding scale insulin, hemoglobin A1c 6.7.

## 2023-05-01 NOTE — Assessment & Plan Note (Signed)
BMI 28.68

## 2023-05-01 NOTE — Evaluation (Signed)
 Physical Therapy Evaluation Patient Details Name: Laurie Berg MRN: [id number] DOB: 1960/10/02 Today's Date: 05/01/2023  History of Present Illness  63 y.o. female  who presented to the ED with worsening SOB and cough, recently admitted 3/14 for acute on chronic hypoxic respiratory failure and acute decompensated COPD, workup now shows fluA. PMH of CAD, DM2, tobacco abuse and COPD, on home O2 at 2 L/min, cervical radiculopathy, chronic pain syndrome.   Clinical Impression  Pt A&Ox4, denied pain. Stated at baseline she is independent, has a daughter that lives with her that assists with IADLs (grocery shopping, errands) if desired. The patient demonstrated modI bed mobility. Sit <> stand many times during session, with and without RW including from standard commode, supervision-modI. She ambulated several bouts of increasing distances on 4L, and progressed to ambulating without RW (introduced for exercise tolerance, not necessary). No LOB, supervision for lines/leads. Pt lowest reading 88% on 4L, RN and MD notified. Overall the patient demonstrated near return to baseline mobility, no acute PT needs indicated, PT to sign off.         If plan is discharge home, recommend the following: Assist for transportation;Help with stairs or ramp for entrance   Can travel by private vehicle        Equipment Recommendations None recommended by PT  Recommendations for Other Services       Functional Status Assessment Patient has not had a recent decline in their functional status     Precautions / Restrictions Precautions Recall of Precautions/Restrictions: Intact Precaution/Restrictions Comments: watch O2 Restrictions Weight Bearing Restrictions Per Provider Order: No      Mobility  Bed Mobility Overal bed mobility: Modified Independent                  Transfers Overall transfer level: Modified independent Equipment used: None, Rolling walker (2 wheels)                     Ambulation/Gait Ambulation/Gait assistance: Supervision Gait Distance (Feet):  (23ft, 23ft, 29ft) Assistive device: Rolling walker (2 wheels), None         General Gait Details: progressed to ambulating bout with RW, able to toilet without assistance (supervision for lines/leads/O2)  Stairs            Wheelchair Mobility     Tilt Bed    Modified Rankin (Stroke Patients Only)       Balance Overall balance assessment: Mild deficits observed, not formally tested   Sitting balance-Leahy Scale: Good       Standing balance-Leahy Scale: Good                               Pertinent Vitals/Pain Pain Assessment Pain Assessment: No/denies pain    Home Living Family/patient expects to be discharged to:: Private residence Living Arrangements: Children Available Help at Discharge: Family;Available PRN/intermittently Type of Home: Mobile home Home Access: Stairs to enter Entrance Stairs-Rails: Can reach both;Left;Right Entrance Stairs-Number of Steps: 6   Home Layout: One level Home Equipment: None Additional Comments: oxygen concentrator and portable tank    Prior Function Prior Level of Function : Independent/Modified Independent;Driving             Mobility Comments: IND ADLs Comments: IND with ADL/IADLs, grocery shopping pushing shopping cart, etc. daughter does help with groceries and errands as needed     Extremity/Trunk Assessment   Upper Extremity Assessment Upper Extremity Assessment: Overall  WFL for tasks assessed    Lower Extremity Assessment Lower Extremity Assessment: Overall WFL for tasks assessed       Communication        Cognition Arousal: Alert Behavior During Therapy: WFL for tasks assessed/performed   PT - Cognitive impairments: No apparent impairments                                 Cueing       General Comments      Exercises     Assessment/Plan    PT Assessment Patient does not  need any further PT services  PT Problem List Cardiopulmonary status limiting activity       PT Treatment Interventions      PT Goals (Current goals can be found in the Care Plan section)       Frequency       Co-evaluation               AM-PAC PT "6 Clicks" Mobility  Outcome Measure Help needed turning from your back to your side while in a flat bed without using bedrails?: None Help needed moving from lying on your back to sitting on the side of a flat bed without using bedrails?: None Help needed moving to and from a bed to a chair (including a wheelchair)?: None Help needed standing up from a chair using your arms (e.g., wheelchair or bedside chair)?: None Help needed to walk in hospital room?: None Help needed climbing 3-5 steps with a railing? : A Little 6 Click Score: 23    End of Session Equipment Utilized During Treatment: Oxygen (4L) Activity Tolerance: Patient tolerated treatment well Patient left: in bed Nurse Communication: Mobility status (O2 status) PT Visit Diagnosis: Difficulty in walking, not elsewhere classified (R26.2)    Time: 1478-2956 PT Time Calculation (min) (ACUTE ONLY): 25 min   Charges:   PT Evaluation $PT Eval Low Complexity: 1 Low PT Treatments $Therapeutic Activity: 8-22 mins PT General Charges $$ ACUTE PT VISIT: 1 Visit       Olga Coaster PT, DPT 11:17 AM,05/01/23

## 2023-05-01 NOTE — Progress Notes (Signed)
 Progress Note   Patient: Laurie Berg VHQ:[id number] DOB: May 17, 1960 DOA: 04/30/2023     0 DOS: the patient was seen and examined on 05/01/2023   Brief hospital course: 63 y.o. female with medical history significant for CAD, type 2 diabetes, and COPD with chronic hypoxemia recently discharged after hospitalization for acute on chronic hypoxemic respiratory failure with acute decompensated COPD on 3/14.  At that time, she was treated for her COPD exacerbation and was thought to have a possible viral syndrome, however COVID, flu, RSV and RVP testing were all negative.  Since discharge, she has had worsening shortness of breath and cough and is now noted to be influenza A positive.  She denies any current fevers or chills or chest pain.  She is noted to have a dry cough with no sputum production.  She states that she has stopped smoking since her recent admission.   ED Course: Vital signs stable and patient on 4 L nasal cannula without any acute respiratory distress or tachypnea noted.  Positive for influenza A.  Chest x-ray with emphysema no other acute findings.  Leukocytosis of 13,200 noted and glucose 66.  Daughter at bedside.  3/18.  Patient did not desaturate with being on 4 L with physical therapy today down to 88%.  Patient does not feel back to her baseline yet.  Assessment and Plan: * Acute on chronic respiratory failure with hypoxia (HCC) Pulse ox dropped down to 88% on 4 L with ambulation with physical therapy.  Was on 3 L upon discharge recently.  Was wearing oxygen as needed before then.  Influenza A On Tamiflu  COPD with acute exacerbation (HCC) On Solu-Medrol and nebulizer treatments and inhalers..  Overweight (BMI 25.0-29.9) BMI 28.68.  Type 2 diabetes mellitus without complications (HCC) On sliding scale insulin, hemoglobin A1c 6.7.  GERD without esophagitis On Protonix  Coronary artery disease On aspirin  Tobacco use disorder Nicotine patch         Subjective: Patient came in with hypoxia and some shortness of breath.  Found to have influenza A positive.  Last time this was negative.  Physical Exam: Vitals:   05/01/23 0452 05/01/23 0600 05/01/23 0800 05/01/23 1200  BP: 116/81 134/81 (!) 125/98 138/74  Pulse: 88 77    Resp: 12 15 17 20   Temp:   97.9 F (36.6 C)   TempSrc:   Oral   SpO2: 94% 94% 97%   Weight:      Height:       Physical Exam HENT:     Head: Normocephalic.     Mouth/Throat:     Pharynx: No oropharyngeal exudate.  Eyes:     General: Lids are normal.     Conjunctiva/sclera: Conjunctivae normal.  Cardiovascular:     Rate and Rhythm: Normal rate and regular rhythm.     Heart sounds: Normal heart sounds, S1 normal and S2 normal.  Pulmonary:     Breath sounds: Examination of the right-middle field reveals decreased breath sounds and wheezing. Examination of the left-middle field reveals decreased breath sounds and wheezing. Examination of the right-lower field reveals decreased breath sounds and wheezing. Examination of the left-lower field reveals decreased breath sounds and wheezing. Decreased breath sounds and wheezing present. No rhonchi or rales.  Abdominal:     Palpations: Abdomen is soft.     Tenderness: There is no abdominal tenderness.  Musculoskeletal:     Right lower leg: No swelling.     Left lower leg: No swelling.  Skin:    General: Skin is warm.     Findings: No rash.  Neurological:     Mental Status: She is alert and oriented to person, place, and time.     Data Reviewed: Hemoglobin A1c 6.7, sodium 134, creatinine 0.7, white blood cell count 9.9, hemoglobin 13.2, platelet count 353  Family Communication: Daughter at bedside  Disposition: Status is: Observation Continue Solu-Medrol with bronchospasm today.  Borderline saturations with 4 L.  Last time sent home with 3 L.  Prior to that was wearing as needed.  Planned Discharge Destination: Home with Home Health    Time spent: 28  minutes  Author: Alford Highland, MD 05/01/2023 12:51 PM  For on call review [email or web address].

## 2023-05-01 NOTE — Assessment & Plan Note (Signed)
 On aspirin

## 2023-05-01 NOTE — Assessment & Plan Note (Addendum)
 On Solu-Medrol and nebulizer treatments and inhalers.Marland Kitchen

## 2023-05-01 NOTE — Assessment & Plan Note (Signed)
 On Tamiflu

## 2023-05-01 NOTE — Assessment & Plan Note (Signed)
 Pulse ox dropped down to 88% on 4 L with ambulation with physical therapy.  Was on 3 L upon discharge recently.  Was wearing oxygen as needed before then.

## 2023-05-01 NOTE — Hospital Course (Signed)
 63 y.o. female with medical history significant for CAD, type 2 diabetes, and COPD with chronic hypoxemia recently discharged after hospitalization for acute on chronic hypoxemic respiratory failure with acute decompensated COPD on 3/14.  At that time, she was treated for her COPD exacerbation and was thought to have a possible viral syndrome, however COVID, flu, RSV and RVP testing were all negative.  Since discharge, she has had worsening shortness of breath and cough and is now noted to be influenza A positive.  She denies any current fevers or chills or chest pain.  She is noted to have a dry cough with no sputum production.  She states that she has stopped smoking since her recent admission.   ED Course: Vital signs stable and patient on 4 L nasal cannula without any acute respiratory distress or tachypnea noted.  Positive for influenza A.  Chest x-ray with emphysema no other acute findings.  Leukocytosis of 13,200 noted and glucose 66.  Daughter at bedside.  3/18.  Patient did not desaturate with being on 4 L with physical therapy today down to 88%.  Patient does not feel back to her baseline yet.

## 2023-05-01 NOTE — ED Notes (Signed)
 Pt helped up to bedside commode.

## 2023-05-01 NOTE — Progress Notes (Signed)
 PULMONOLOGY         Date: 05/01/2023,   MRN# [id number] Laurie Berg 14-Oct-1960     AdmissionWeight: 75.8 kg                 CurrentWeight: 75.8 kg  Referring provider: Dr Renae Gloss   CHIEF COMPLAINT:   Acute on chronic hypoxemic respiratory failure   HISTORY OF PRESENT ILLNESS   This is a 63 yo F with history of lifelong smoking, COPD with chronic hypoxemia on home Trelegy once daily inhaler, previous bouts of COVID19, CAD, lumbago and cervical radiculopathy, DM2, lung nodules who came in with complaints of wheezing and dyspnea x 4 days with increased O2 requirement. She generally uses 2L/min [place] supplemental O2 at home but noted oxygen was dropping in the mid 70s.  She does continue to smoke and we discussed smoking cessation in the past. She was seen in ER with labored breathing, tachypnea, tachycardia and required 6L/min [place] to reach normoxia.  She had chest imaging performed noted to have bronchitic changes suggestive of COPD exacerbation but absence of florid pneumonia.  She does report onset of pedal edema recently.  She was placed on empiric antibiotics with vanco/cefepime/flagyl and started on LR for IVF. She had RVP which was negative.  She had CT head which was normal besides age related microvascular disease.   Patient was discharged 04/27/23 with acute COPD exacerbation.  She now has acute exacerbation again with Influenza A infection acutely.  PCCM consultation for additional evaluation and management.    PAST MEDICAL HISTORY   Past Medical History:  Diagnosis Date   Cervical radiculopathy    Chronic pain syndrome    Colon polyps    COPD (chronic obstructive pulmonary disease) (HCC)    Coronary artery calcification seen on CT scan    a. 04/2021 High Res CT Chest: Ao atherosclerosis, cor Ca2+; b. 07/2021 MV: EF 69%, no isch/infarct, LAD/LCX Ca2+ noted; c. 02/2022 CT Chest: Cor and Ao atherosclerosis.   COVID-19 virus infection    Diabetes mellitus without  complication (HCC)    History of echocardiogram    a. 08/2021 Echo: EF 55-60%, no rwma, nl RV fxn.   Lung nodule    a. 02/2022 CT Chest: RLL 7mm solid subpleural pulm nodule, prev 18mm-->rec thoracic surgery eval.   Sepsis (HCC)    Tobacco abuse      SURGICAL HISTORY   Past Surgical History:  Procedure Laterality Date   CARPAL TUNNEL RELEASE     COLONOSCOPY WITH PROPOFOL N/A 10/21/2019   Procedure: COLONOSCOPY WITH PROPOFOL;  Surgeon: Pasty Spillers, MD;  Location: [facility] ENDOSCOPY;  Service: Endoscopy;  Laterality: N/A;   DILATION AND CURETTAGE OF UTERUS     TUBAL LIGATION       FAMILY HISTORY   History reviewed. No pertinent family history.   SOCIAL HISTORY   Social History   Tobacco Use   Smoking status: Every Day    Current packs/day: 1.75    Average packs/day: 1.8 packs/day for 48.0 years (84.0 ttl pk-yrs)    Types: Cigarettes   Smokeless tobacco: Never  Vaping Use   Vaping status: Never Used  Substance Use Topics   Alcohol use: Not Currently   Drug use: Never     MEDICATIONS    Home Medication:    Current Medication:  Current Facility-Administered Medications:    acetaminophen (TYLENOL) tablet 650 mg, 650 mg, Oral, Q6H PRN **OR** acetaminophen (TYLENOL) suppository 650 mg, 650 mg, Rectal,  Q6H PRN, Sherryll Burger, Pratik D, DO   aspirin chewable tablet 81 mg, 81 mg, Oral, Daily, Maurilio Lovely D, DO, 81 mg at 05/01/23 0957   budesonide (PULMICORT) nebulizer solution 0.25 mg, 0.25 mg, Nebulization, BID, Sherryll Burger, Pratik D, DO, 0.25 mg at 05/01/23 1027   cyclobenzaprine (FLEXERIL) tablet 10 mg, 10 mg, Oral, BID PRN, Sherryll Burger, Pratik D, DO, 10 mg at 05/01/23 0040   doxycycline (VIBRA-TABS) tablet 100 mg, 100 mg, Oral, Q12H, Sherryll Burger, Pratik D, DO, 100 mg at 05/01/23 0957   DULoxetine (CYMBALTA) DR capsule 90 mg, 90 mg, Oral, Daily, Maurilio Lovely D, DO, 90 mg at 05/01/23 0959   enoxaparin (LOVENOX) injection 40 mg, 40 mg, Subcutaneous, Q24H, Shah, Pratik D, DO, 40 mg at 04/30/23  2105   insulin aspart (novoLOG) injection 0-5 Units, 0-5 Units, Subcutaneous, QHS, Shah, Pratik D, DO, 3 Units at 04/30/23 2212   insulin aspart (novoLOG) injection 0-6 Units, 0-6 Units, Subcutaneous, TID WC, Shah, Pratik D, DO   ipratropium-albuterol (DUONEB) 0.5-2.5 (3) MG/3ML nebulizer solution 3 mL, 3 mL, Nebulization, Q6H, Shah, Pratik D, DO, 3 mL at 05/01/23 1417   linaclotide (LINZESS) capsule 72 mcg, 72 mcg, Oral, q morning, Shah, Pratik D, DO   linagliptin (TRADJENTA) tablet 5 mg, 5 mg, Oral, Daily, Sherryll Burger, Pratik D, DO, 5 mg at 05/01/23 1001   loratadine (CLARITIN) tablet 10 mg, 10 mg, Oral, Daily, Sherryll Burger, Pratik D, DO, 10 mg at 05/01/23 1000   metFORMIN (GLUCOPHAGE-XR) 24 hr tablet 1,000 mg, 1,000 mg, Oral, BID WC, Shah, Pratik D, DO, 1,000 mg at 05/01/23 2536   methylPREDNISolone sodium succinate (SOLU-MEDROL) 40 mg/mL injection 40 mg, 40 mg, Intravenous, Q12H, Shah, Pratik D, DO, 40 mg at 04/30/23 2340   nicotine (NICODERM CQ - dosed in mg/24 hours) patch 21 mg, 21 mg, Transdermal, Q24H, Shah, Pratik D, DO   ondansetron (ZOFRAN) tablet 4 mg, 4 mg, Oral, Q6H PRN **OR** ondansetron (ZOFRAN) injection 4 mg, 4 mg, Intravenous, Q6H PRN, Sherryll Burger, Pratik D, DO   oseltamivir (TAMIFLU) capsule 75 mg, 75 mg, Oral, BID, Sherryll Burger, Pratik D, DO, 75 mg at 05/01/23 6440   oxyCODONE (OXYCONTIN) 12 hr tablet 30 mg, 30 mg, Oral, BID, Sherryll Burger, Pratik D, DO, 30 mg at 05/01/23 1002   oxyCODONE-acetaminophen (PERCOCET/ROXICET) 5-325 MG per tablet 1 tablet, 1 tablet, Oral, Q6H PRN, Sherryll Burger, Pratik D, DO   pantoprazole (PROTONIX) EC tablet 40 mg, 40 mg, Oral, Daily, Sherryll Burger, Pratik D, DO, 40 mg at 05/01/23 0957   polyethylene glycol (MIRALAX / GLYCOLAX) packet 17 g, 17 g, Oral, BID, Sherryll Burger, Pratik D, DO   senna-docusate (Senokot-S) tablet 2 tablet, 2 tablet, Oral, QHS PRN, Sherryll Burger, Pratik D, DO    ALLERGIES   Pregabalin and Sulfamethoxazole-trimethoprim     REVIEW OF SYSTEMS    Review of Systems:  Gen:  Denies  fever,  sweats, chills weigh loss  HEENT: Denies blurred vision, double vision, ear pain, eye pain, hearing loss, nose bleeds, sore throat Cardiac:  No dizziness, chest pain or heaviness, chest tightness,edema Resp:   reports dyspnea chronically  Gi: Denies swallowing difficulty, stomach pain, nausea or vomiting, diarrhea, constipation, bowel incontinence Gu:  Denies bladder incontinence, burning urine Ext:   Denies Joint pain, stiffness or swelling Skin: Denies  skin rash, easy bruising or bleeding or hives Endoc:  Denies polyuria, polydipsia , polyphagia or weight change Psych:   Denies depression, insomnia or hallucinations   Other:  All other systems negative   VS: BP 135/82   Pulse  77   Temp 97.9 F (36.6 C) (Oral)   Resp 15   Ht 5\' 4"  (1.626 m)   Wt 75.8 kg   SpO2 95%   BMI 28.68 kg/m      PHYSICAL EXAM    GENERAL:NAD, no fevers, chills, no weakness no fatigue HEAD: Normocephalic, atraumatic.  EYES: Pupils equal, round, reactive to light. Extraocular muscles intact. No scleral icterus.  MOUTH: Moist mucosal membrane. Dentition intact. No abscess noted.  EAR, NOSE, THROAT: Clear without exudates. No external lesions.  NECK: Supple. No thyromegaly. No nodules. No JVD.  PULMONARY: decreased breath sounds with mild rhonchi worse at bases bilaterally.  CARDIOVASCULAR: S1 and S2. Regular rate and rhythm. No murmurs, rubs, or gallops. No edema. Pedal pulses 2+ bilaterally.  GASTROINTESTINAL: Soft, nontender, nondistended. No masses. Positive bowel sounds. No hepatosplenomegaly.  MUSCULOSKELETAL: No swelling, clubbing, or edema. Range of motion full in all extremities.  NEUROLOGIC: Cranial nerves II through XII are intact. No gross focal neurological deficits. Sensation intact. Reflexes intact.  SKIN: No ulceration, lesions, rashes, or cyanosis. Skin warm and dry. Turgor intact.  PSYCHIATRIC: Mood, affect within normal limits. The patient is awake, alert and oriented x 3. Insight,  judgment intact.       IMAGING       ASSESSMENT/PLAN   Acute on chronic hypoxemic respiratory failure   - thus far infectious workup is + for influenza only, patient is on tamiflu   - will narrow antimicrobial regimen to doxycycline BID   - CRP trend ordered    - continue steroids with tapering protocol      - nebulizer therapy per COPD carepath    -CXR above with hazy opacification of RLL but clear costophrenic and cardiophrenic angle- have ordered repeat for today    - no need for pulmicort nebulizer therapy   -agree with doxy bid   Bibasilar atelectasis     - chest physiotherapy with incentive spirometry and flutter valve   - PT/OT when able     Tobacco dependence    - nicotine replacement has been provided   Allergic rhinosinusitis   - continue [place] nasal spary    Insomnia     Trazadone QHS      Thank you for allowing me to participate in the care of this patient.   Patient/Family are satisfied with care plan and all questions have been answered.    Provider disclosure: Patient with at least one acute or chronic illness or injury that poses a threat to life or bodily function and is being managed actively during this encounter.  All of the below services have been performed independently by signing provider:  review of prior documentation from internal and or external health records.  Review of previous and current lab results.  Interview and comprehensive assessment during patient visit today. Review of current and previous chest radiographs/CT scans. Discussion of management and test interpretation with health care team and patient/family.   This document was prepared using Dragon voice recognition software and may include unintentional dictation errors.     Vida Rigger, M.D.  Division of Pulmonary & Critical Care Medicine

## 2023-05-02 DIAGNOSIS — J9621 Acute and chronic respiratory failure with hypoxia: Secondary | ICD-10-CM | POA: Diagnosis not present

## 2023-05-02 LAB — GLUCOSE, CAPILLARY
Glucose-Capillary: 136 mg/dL — ABNORMAL HIGH (ref 70–99)
Glucose-Capillary: 82 mg/dL (ref 70–99)
Glucose-Capillary: 121 mg/dL — ABNORMAL HIGH (ref 70–99)
Glucose-Capillary: 177 mg/dL — ABNORMAL HIGH (ref 70–99)

## 2023-05-02 LAB — C-REACTIVE PROTEIN: CRP: 0.9 mg/dL (ref <1.0)

## 2023-05-02 MED ORDER — ATORVASTATIN CALCIUM 20 MG PO TABS
20.0000 mg | ORAL_TABLET | Freq: Every day | ORAL | Status: DC
  Administered 2023-05-02 – 2023-05-05 (×4): 20 mg via ORAL
  Filled 2023-05-02 (×4): qty 1

## 2023-05-02 MED ORDER — METHYLPREDNISOLONE SODIUM SUCC 40 MG IJ SOLR
40.0000 mg | INTRAMUSCULAR | Status: AC
Start: 2023-05-03 — End: 2023-05-03
  Administered 2023-05-03: 40 mg via INTRAVENOUS
  Filled 2023-05-02: qty 1

## 2023-05-02 MED ORDER — ALBUTEROL SULFATE (2.5 MG/3ML) 0.083% IN NEBU
2.5000 mg | INHALATION_SOLUTION | RESPIRATORY_TRACT | Status: DC | PRN
  Administered 2023-05-04: 2.5 mg via RESPIRATORY_TRACT
  Filled 2023-05-02: qty 3

## 2023-05-02 NOTE — Plan of Care (Addendum)
 Patient is alert and oriented X 4. He dropped his oxygen saturation to 86 % with out oxygen at rest and came back to 92% at the rate of 3.5 liter oxygen at rest. Encouraged pt to use incentive spirometry as well as flutter valve. Plan of care ongoing.  Problem: Education: Goal: Ability to describe self-care measures that may prevent or decrease complications (Diabetes Survival Skills Education) will improve Outcome: Progressing Goal: Individualized Educational Video(s) Outcome: Progressing   Problem: Coping: Goal: Ability to adjust to condition or change in health will improve Outcome: Progressing   Problem: Fluid Volume: Goal: Ability to maintain a balanced intake and output will improve Outcome: Progressing   Problem: Health Behavior/Discharge Planning: Goal: Ability to identify and utilize available resources and services will improve Outcome: Progressing Goal: Ability to manage health-related needs will improve Outcome: Progressing   Problem: Metabolic: Goal: Ability to maintain appropriate glucose levels will improve Outcome: Progressing   Problem: Skin Integrity: Goal: Risk for impaired skin integrity will decrease Outcome: Progressing   Problem: Tissue Perfusion: Goal: Adequacy of tissue perfusion will improve Outcome: Progressing   Problem: Education: Goal: Knowledge of General Education information will improve Description: Including pain rating scale, medication(s)/side effects and non-pharmacologic comfort measures Outcome: Progressing

## 2023-05-02 NOTE — Progress Notes (Signed)
 PROGRESS NOTE    Laurie Berg  BJY:[id number] DOB: 05/29/1960 DOA: 04/30/2023 PCP: Leanna Sato, MD    Brief Narrative:   63 y.o. female with medical history significant for CAD, type 2 diabetes, and COPD with chronic hypoxemia recently discharged after hospitalization for acute on chronic hypoxemic respiratory failure with acute decompensated COPD on 3/14.  At that time, she was treated for her COPD exacerbation and was thought to have a possible viral syndrome, however COVID, flu, RSV and RVP testing were all negative.  Since discharge, she has had worsening shortness of breath and cough and is now noted to be influenza A positive.  She denies any current fevers or chills or chest pain.  She is noted to have a dry cough with no sputum production.  She states that she has stopped smoking since her recent admission.   ED Course: Vital signs stable and patient on 4 L nasal cannula without any acute respiratory distress or tachypnea noted.  Positive for influenza A.  Chest x-ray with emphysema no other acute findings.  Leukocytosis of 13,200 noted and glucose 66.  Daughter at bedside.   3/18.  Patient did not desaturate with being on 4 L with physical therapy today down to 88%.  Patient does not feel back to her baseline yet.   Assessment & Plan:   Principal Problem:   Acute on chronic respiratory failure with hypoxia (HCC) Active Problems:   Influenza A   COPD with acute exacerbation (HCC)   Tobacco use disorder   Coronary artery disease   GERD without esophagitis   Type 2 diabetes mellitus without complications (HCC)   Overweight (BMI 25.0-29.9)   Acute respiratory failure with hypoxia (HCC)  * Acute on chronic respiratory failure with hypoxia (HCC) Likely result of dependent COPD and continued smoking in the setting of recent influenza infection Plan: Steroids Bronchodilators Trend inflammatory markers Doxycycline twice daily   Influenza A On Tamiflu   COPD with acute  exacerbation (HCC) As above   Overweight (BMI 25.0-29.9) BMI 28.68.   Type 2 diabetes mellitus without complications (HCC) On sliding scale insulin, hemoglobin A1c 6.7.   GERD without esophagitis On Protonix   Coronary artery disease On aspirin   Tobacco use disorder Nicotine patch     DVT prophylaxis: Lovenox Code Status: Full Family Communication: Daughter at bedside 3/19 Disposition Plan: Status is: Inpatient Remains inpatient appropriate because: Acute on chronic hypoxic respiratory failure, COPD flare   Level of care: Med-Surg  Consultants:  Pulmonology  Procedures:  None  Antimicrobials: Doxycycline   Subjective: Seen and examined.  Reports shortness of breath is improving.  Not yet at baseline.  Objective: Vitals:   05/02/23 0432 05/02/23 0751 05/02/23 1100 05/02/23 1113  BP: 119/76 116/79    Pulse: 84 80    Resp: 18 20    Temp: 97.9 F (36.6 C) 97.8 F (36.6 C)    TempSrc: Oral     SpO2: 93% 94% (!) 86% 92%  Weight:      Height:        Intake/Output Summary (Last 24 hours) at 05/02/2023 1240 Last data filed at 05/02/2023 0900 Gross per 24 hour  Intake 480 ml  Output --  Net 480 ml   Filed Weights   04/30/23 1437  Weight: 75.8 kg    Examination:  General exam: Appears calm and comfortable  Respiratory system: Diminished breath sounds.  Overall clear.  Mild end expiratory wheeze.  Normal work of breathing.  3  L Cardiovascular system: S1-S2, RRR, no murmurs, no pedal edema Gastrointestinal system: Soft, NT/ND, normal bowel sounds Central nervous system: Alert and oriented. No focal neurological deficits. Extremities: Symmetric 5 x 5 power. Skin: No rashes, lesions or ulcers Psychiatry: Judgement and insight appear normal. Mood & affect appropriate.     Data Reviewed: I have personally reviewed following labs and imaging studies  CBC: Recent Labs  Lab 04/26/23 0441 04/27/23 0508 04/30/23 1438 04/30/23 2040 05/01/23 0430   WBC 10.5 6.4 13.2* 10.4 9.9  NEUTROABS  --   --  8.9*  --   --   HGB 11.9* 12.2 13.9 13.3 13.2  HCT 36.7 36.7 42.9 39.7 40.1  MCV 89.1 90.0 88.3 88.4 88.9  PLT 226 227 374 332 356   Basic Metabolic Panel: Recent Labs  Lab 04/26/23 0441 04/27/23 0508 04/30/23 1438 04/30/23 2040 05/01/23 0430  NA 135 137 138  --  134*  K 4.3 4.2 3.5  --  4.0  CL 98 95* 98  --  97*  CO2 28 32 29  --  26  GLUCOSE 89 222* 66*  --  232*  BUN 16 18 22   --  18  CREATININE 0.72 0.72 0.70 0.88 0.79  CALCIUM 8.5* 8.6* 9.0  --  9.3  MG  --   --   --   --  1.9   GFR: Estimated Creatinine Clearance: 73.6 mL/min (by C-G formula based on SCr of 0.79 mg/dL). Liver Function Tests: No results for input(s): "AST", "ALT", "ALKPHOS", "BILITOT", "PROT", "ALBUMIN" in the last 168 hours. No results for input(s): "LIPASE", "AMYLASE" in the last 168 hours. No results for input(s): "AMMONIA" in the last 168 hours. Coagulation Profile: No results for input(s): "INR", "PROTIME" in the last 168 hours. Cardiac Enzymes: No results for input(s): "CKTOTAL", "CKMB", "CKMBINDEX", "TROPONINI" in the last 168 hours. BNP (last 3 results) No results for input(s): "PROBNP" in the last 8760 hours. HbA1C: No results for input(s): "HGBA1C" in the last 72 hours. CBG: Recent Labs  Lab 05/01/23 1439 05/01/23 1623 05/01/23 2057 05/02/23 0751 05/02/23 1141  GLUCAP 111* 174* 245* 136* 82   Lipid Profile: No results for input(s): "CHOL", "HDL", "LDLCALC", "TRIG", "CHOLHDL", "LDLDIRECT" in the last 72 hours. Thyroid Function Tests: No results for input(s): "TSH", "T4TOTAL", "FREET4", "T3FREE", "THYROIDAB" in the last 72 hours. Anemia Panel: No results for input(s): "VITAMINB12", "FOLATE", "FERRITIN", "TIBC", "IRON", "RETICCTPCT" in the last 72 hours. Sepsis Labs: No results for input(s): "PROCALCITON", "LATICACIDVEN" in the last 168 hours.  Recent Results (from the past 240 hours)  Resp panel by RT-PCR (RSV, Flu A&B, Covid)  Anterior Nasal Swab     Status: None   Collection Time: 04/23/23  8:16 PM   Specimen: Anterior Nasal Swab  Result Value Ref Range Status   SARS Coronavirus 2 by RT PCR NEGATIVE NEGATIVE Final    Comment: (NOTE) SARS-CoV-2 target nucleic acids are NOT DETECTED.  The SARS-CoV-2 RNA is generally detectable in upper respiratory specimens during the acute phase of infection. The lowest concentration of SARS-CoV-2 viral copies this assay can detect is 138 copies/mL. A negative result does not preclude SARS-Cov-2 infection and should not be used as the sole basis for treatment or other patient management decisions. A negative result may occur with  improper specimen collection/handling, submission of specimen other than nasopharyngeal swab, presence of viral mutation(s) within the areas targeted by this assay, and inadequate number of viral copies(<138 copies/mL). A negative result must be combined with clinical  observations, patient history, and epidemiological information. The expected result is Negative.  Fact Sheet for Patients:  BloggerCourse.com  Fact Sheet for Healthcare Providers:  SeriousBroker.it  This test is no t yet approved or cleared by the [place] FDA and  has been authorized for detection and/or diagnosis of SARS-CoV-2 by FDA under an Emergency Use Authorization (EUA). This EUA will remain  in effect (meaning this test can be used) for the duration of the COVID-19 declaration under Section 564(b)(1) of the Act, 21 U.S.C.section 360bbb-3(b)(1), unless the authorization is terminated  or revoked sooner.       Influenza A by PCR NEGATIVE NEGATIVE Final   Influenza B by PCR NEGATIVE NEGATIVE Final    Comment: (NOTE) The Xpert Xpress SARS-CoV-2/FLU/RSV plus assay is intended as an aid in the diagnosis of influenza from Nasopharyngeal swab specimens and should not be used as a sole basis for treatment. Nasal washings  and aspirates are unacceptable for Xpert Xpress SARS-CoV-2/FLU/RSV testing.  Fact Sheet for Patients: BloggerCourse.com  Fact Sheet for Healthcare Providers: SeriousBroker.it  This test is not yet approved or cleared by the [place] FDA and has been authorized for detection and/or diagnosis of SARS-CoV-2 by FDA under an Emergency Use Authorization (EUA). This EUA will remain in effect (meaning this test can be used) for the duration of the COVID-19 declaration under Section 564(b)(1) of the Act, 21 U.S.C. section 360bbb-3(b)(1), unless the authorization is terminated or revoked.     Resp Syncytial Virus by PCR NEGATIVE NEGATIVE Final    Comment: (NOTE) Fact Sheet for Patients: BloggerCourse.com  Fact Sheet for Healthcare Providers: SeriousBroker.it  This test is not yet approved or cleared by the [place] FDA and has been authorized for detection and/or diagnosis of SARS-CoV-2 by FDA under an Emergency Use Authorization (EUA). This EUA will remain in effect (meaning this test can be used) for the duration of the COVID-19 declaration under Section 564(b)(1) of the Act, 21 U.S.C. section 360bbb-3(b)(1), unless the authorization is terminated or revoked.  Performed at [facility], [address] [place], [place] [address]   Blood Culture (routine x 2)     Status: None   Collection Time: 04/23/23  8:16 PM   Specimen: BLOOD  Result Value Ref Range Status   Specimen Description BLOOD LEFT ANTECUBITAL  Final   Special Requests   Final    BOTTLES DRAWN AEROBIC AND ANAEROBIC Blood Culture results may not be optimal due to an inadequate volume of blood received in culture bottles   Culture   Final    NO GROWTH 5 DAYS Performed at [facility], [address] [place], [place] [address]    Report Status 04/28/2023 FINAL  Final  Blood Culture (routine  x 2)     Status: None   Collection Time: 04/23/23  8:21 PM   Specimen: BLOOD  Result Value Ref Range Status   Specimen Description BLOOD RIGHT ANTECUBITAL  Final   Special Requests   Final    BOTTLES DRAWN AEROBIC AND ANAEROBIC Blood Culture adequate volume   Culture   Final    NO GROWTH 5 DAYS Performed at [facility], [address] [place], [place] [address]    Report Status 04/28/2023 FINAL  Final  Respiratory (~20 pathogens) panel by PCR     Status: None   Collection Time: 04/24/23  8:25 AM   Specimen: Nasopharyngeal Swab; Respiratory  Result Value Ref Range Status   Adenovirus NOT DETECTED NOT DETECTED Final  Coronavirus 229E NOT DETECTED NOT DETECTED Final    Comment: (NOTE) The Coronavirus on the Respiratory Panel, DOES NOT test for the novel  Coronavirus (2019 nCoV)    Coronavirus HKU1 NOT DETECTED NOT DETECTED Final   Coronavirus NL63 NOT DETECTED NOT DETECTED Final   Coronavirus OC43 NOT DETECTED NOT DETECTED Final   Metapneumovirus NOT DETECTED NOT DETECTED Final   Rhinovirus / Enterovirus NOT DETECTED NOT DETECTED Final   Influenza A NOT DETECTED NOT DETECTED Final   Influenza B NOT DETECTED NOT DETECTED Final   Parainfluenza Virus 1 NOT DETECTED NOT DETECTED Final   Parainfluenza Virus 2 NOT DETECTED NOT DETECTED Final   Parainfluenza Virus 3 NOT DETECTED NOT DETECTED Final   Parainfluenza Virus 4 NOT DETECTED NOT DETECTED Final   Respiratory Syncytial Virus NOT DETECTED NOT DETECTED Final   Bordetella pertussis NOT DETECTED NOT DETECTED Final   Bordetella Parapertussis NOT DETECTED NOT DETECTED Final   Chlamydophila pneumoniae NOT DETECTED NOT DETECTED Final   Mycoplasma pneumoniae NOT DETECTED NOT DETECTED Final    Comment: Performed at [facility], [address], [place], [place] [address]  MRSA Next Gen by PCR, Nasal     Status: None   Collection Time: 04/26/23  9:00 AM   Specimen: Nasal Mucosa; Nasal Swab  Result Value Ref Range Status    MRSA by PCR Next Gen NOT DETECTED NOT DETECTED Final    Comment: (NOTE) The GeneXpert MRSA Assay (FDA approved for NASAL specimens only), is one component of a comprehensive MRSA colonization surveillance program. It is not intended to diagnose MRSA infection nor to guide or monitor treatment for MRSA infections. Test performance is not FDA approved in patients less than 22 years old. Performed at [facility], [address] [place], [place] [address]   Resp panel by RT-PCR (RSV, Flu A&B, Covid) Anterior Nasal Swab     Status: Abnormal   Collection Time: 04/30/23  4:27 PM   Specimen: Anterior Nasal Swab  Result Value Ref Range Status   SARS Coronavirus 2 by RT PCR NEGATIVE NEGATIVE Final    Comment: (NOTE) SARS-CoV-2 target nucleic acids are NOT DETECTED.  The SARS-CoV-2 RNA is generally detectable in upper respiratory specimens during the acute phase of infection. The lowest concentration of SARS-CoV-2 viral copies this assay can detect is 138 copies/mL. A negative result does not preclude SARS-Cov-2 infection and should not be used as the sole basis for treatment or other patient management decisions. A negative result may occur with  improper specimen collection/handling, submission of specimen other than nasopharyngeal swab, presence of viral mutation(s) within the areas targeted by this assay, and inadequate number of viral copies(<138 copies/mL). A negative result must be combined with clinical observations, patient history, and epidemiological information. The expected result is Negative.  Fact Sheet for Patients:  BloggerCourse.com  Fact Sheet for Healthcare Providers:  SeriousBroker.it  This test is no t yet approved or cleared by the [place] FDA and  has been authorized for detection and/or diagnosis of SARS-CoV-2 by FDA under an Emergency Use Authorization (EUA). This EUA will remain  in effect  (meaning this test can be used) for the duration of the COVID-19 declaration under Section 564(b)(1) of the Act, 21 U.S.C.section 360bbb-3(b)(1), unless the authorization is terminated  or revoked sooner.       Influenza A by PCR POSITIVE (A) NEGATIVE Final   Influenza B by PCR NEGATIVE NEGATIVE Final    Comment: (NOTE) The Xpert Xpress SARS-CoV-2/FLU/RSV  plus assay is intended as an aid in the diagnosis of influenza from Nasopharyngeal swab specimens and should not be used as a sole basis for treatment. Nasal washings and aspirates are unacceptable for Xpert Xpress SARS-CoV-2/FLU/RSV testing.  Fact Sheet for Patients: BloggerCourse.com  Fact Sheet for Healthcare Providers: SeriousBroker.it  This test is not yet approved or cleared by the [place] FDA and has been authorized for detection and/or diagnosis of SARS-CoV-2 by FDA under an Emergency Use Authorization (EUA). This EUA will remain in effect (meaning this test can be used) for the duration of the COVID-19 declaration under Section 564(b)(1) of the Act, 21 U.S.C. section 360bbb-3(b)(1), unless the authorization is terminated or revoked.     Resp Syncytial Virus by PCR NEGATIVE NEGATIVE Final    Comment: (NOTE) Fact Sheet for Patients: BloggerCourse.com  Fact Sheet for Healthcare Providers: SeriousBroker.it  This test is not yet approved or cleared by the [place] FDA and has been authorized for detection and/or diagnosis of SARS-CoV-2 by FDA under an Emergency Use Authorization (EUA). This EUA will remain in effect (meaning this test can be used) for the duration of the COVID-19 declaration under Section 564(b)(1) of the Act, 21 U.S.C. section 360bbb-3(b)(1), unless the authorization is terminated or revoked.  Performed at [facility], [address], [place], [place] [address]           Radiology Studies: DG Chest 2 View Result Date: 04/30/2023 CLINICAL DATA:  Short of breath, COPD EXAM: CHEST - 2 VIEW COMPARISON:  04/27/2023 FINDINGS: Frontal and lateral views of the chest demonstrate a stable cardiac silhouette. Stable hyperinflation and background interstitial scarring consistent with emphysema. The patchy consolidation seen on prior study has near completely resolved in the interim, with no airspace disease, effusion, or pneumothorax identified on today's exam. No acute bony abnormalities. IMPRESSION: 1. Emphysema, with interval resolution of the patchy upper lobe predominant airspace disease seen previously. Electronically Signed   By: Sharlet Salina M.D.   On: 04/30/2023 16:22        Scheduled Meds:  aspirin  81 mg Oral Daily   DULoxetine  90 mg Oral Daily   enoxaparin (LOVENOX) injection  40 mg Subcutaneous Q24H   insulin aspart  0-5 Units Subcutaneous QHS   insulin aspart  0-6 Units Subcutaneous TID WC   ipratropium-albuterol  3 mL Nebulization Q6H   linaclotide  72 mcg Oral q morning   linagliptin  5 mg Oral Daily   loratadine  10 mg Oral Daily   metFORMIN  1,000 mg Oral BID WC   methylPREDNISolone (SOLU-MEDROL) injection  40 mg Intravenous Q12H   nicotine  21 mg Transdermal Q24H   oseltamivir  75 mg Oral BID   oxyCODONE  30 mg Oral BID   pantoprazole  40 mg Oral Daily   polyethylene glycol  17 g Oral BID   Continuous Infusions:   LOS: 1 day     Tresa Moore, MD [facility]   If 7PM-7AM, please contact night-coverage  05/02/2023, 12:40 PM

## 2023-05-02 NOTE — Progress Notes (Signed)
 PULMONOLOGY         Date: 05/02/2023,   MRN# [id number] Laurie Berg Oct 24, 1960     AdmissionWeight: 75.8 kg                 CurrentWeight: 75.8 kg  Referring provider: Dr Renae Gloss   CHIEF COMPLAINT:   Acute on chronic hypoxemic respiratory failure   HISTORY OF PRESENT ILLNESS   This is a 63 yo F with history of lifelong smoking, COPD with chronic hypoxemia on home Trelegy once daily inhaler, previous bouts of COVID19, CAD, lumbago and cervical radiculopathy, DM2, lung nodules who came in with complaints of wheezing and dyspnea x 4 days with increased O2 requirement. She generally uses 2L/min [place] supplemental O2 at home but noted oxygen was dropping in the mid 70s.  She does continue to smoke and we discussed smoking cessation in the past. She was seen in ER with labored breathing, tachypnea, tachycardia and required 6L/min [place] to reach normoxia.  She had chest imaging performed noted to have bronchitic changes suggestive of COPD exacerbation but absence of florid pneumonia.  She does report onset of pedal edema recently.  She was placed on empiric antibiotics with vanco/cefepime/flagyl and started on LR for IVF. She had RVP which was negative.  She had CT head which was normal besides age related microvascular disease.   Patient was discharged 04/27/23 with acute COPD exacerbation.  She now has acute exacerbation again with Influenza A infection acutely.  PCCM consultation for additional evaluation and management.   05/02/23 - patient seen at bedside , moderately rhonchorous. Family present at bedside. O2 weaned down to 3L/min. She is having forceful cough with expectoration of phlegm.  CRP is flat compared to previous. Have reduced steroids to 40IV daily. Will plan to finish 7d course of doxy. PT/OT and IS   PAST MEDICAL HISTORY   Past Medical History:  Diagnosis Date   Cervical radiculopathy    Chronic pain syndrome    Colon polyps    COPD (chronic obstructive  pulmonary disease) (HCC)    Coronary artery calcification seen on CT scan    a. 04/2021 High Res CT Chest: Ao atherosclerosis, cor Ca2+; b. 07/2021 MV: EF 69%, no isch/infarct, LAD/LCX Ca2+ noted; c. 02/2022 CT Chest: Cor and Ao atherosclerosis.   COVID-19 virus infection    Diabetes mellitus without complication (HCC)    History of echocardiogram    a. 08/2021 Echo: EF 55-60%, no rwma, nl RV fxn.   Lung nodule    a. 02/2022 CT Chest: RLL 7mm solid subpleural pulm nodule, prev 65mm-->rec thoracic surgery eval.   Sepsis (HCC)    Tobacco abuse      SURGICAL HISTORY   Past Surgical History:  Procedure Laterality Date   CARPAL TUNNEL RELEASE     COLONOSCOPY WITH PROPOFOL N/A 10/21/2019   Procedure: COLONOSCOPY WITH PROPOFOL;  Surgeon: Pasty Spillers, MD;  Location: [facility] END[facility];  Service: Endoscopy;  Laterality: N/A;   DILATION AND CURETTAGE OF UTERUS     TUBAL LIGATION       FAMILY HISTORY   History reviewed. No pertinent family history.   SOCIAL HISTORY   Social History   Tobacco Use   Smoking status: Every Day    Current packs/day: 1.75    Average packs/day: 1.8 packs/day for 48.0 years (84.0 ttl pk-yrs)    Types: Cigarettes   Smokeless tobacco: Never  Vaping Use   Vaping status: Never Used  Substance Use Topics  Alcohol use: Not Currently   Drug use: Never     MEDICATIONS    Home Medication:    Current Medication:  Current Facility-Administered Medications:    acetaminophen (TYLENOL) tablet 650 mg, 650 mg, Oral, Q6H PRN, 650 mg at 05/01/23 2128 **OR** acetaminophen (TYLENOL) suppository 650 mg, 650 mg, Rectal, Q6H PRN, Sherryll Burger, Pratik D, DO   aspirin chewable tablet 81 mg, 81 mg, Oral, Daily, Sherryll Burger, Pratik D, DO, 81 mg at 05/02/23 0859   cyclobenzaprine (FLEXERIL) tablet 10 mg, 10 mg, Oral, BID PRN, Sherryll Burger, Pratik D, DO, 10 mg at 05/01/23 0040   DULoxetine (CYMBALTA) DR capsule 90 mg, 90 mg, Oral, Daily, Sherryll Burger, Pratik D, DO, 90 mg at 05/02/23 0859   enoxaparin  (LOVENOX) injection 40 mg, 40 mg, Subcutaneous, Q24H, Shah, Pratik D, DO, 40 mg at 05/01/23 1857   insulin aspart (novoLOG) injection 0-5 Units, 0-5 Units, Subcutaneous, QHS, Shah, Pratik D, DO, 2 Units at 05/01/23 2120   insulin aspart (novoLOG) injection 0-6 Units, 0-6 Units, Subcutaneous, TID WC, Shah, Pratik D, DO, 1 Units at 05/01/23 1702   ipratropium-albuterol (DUONEB) 0.5-2.5 (3) MG/3ML nebulizer solution 3 mL, 3 mL, Nebulization, Q6H, Shah, Pratik D, DO, 3 mL at 05/02/23 0721   linaclotide (LINZESS) capsule 72 mcg, 72 mcg, Oral, q morning, Sherryll Burger, Pratik D, DO, 72 mcg at 05/02/23 0901   linagliptin (TRADJENTA) tablet 5 mg, 5 mg, Oral, Daily, Sherryll Burger, Pratik D, DO, 5 mg at 05/02/23 0901   loratadine (CLARITIN) tablet 10 mg, 10 mg, Oral, Daily, Sherryll Burger, Pratik D, DO, 10 mg at 05/02/23 0859   metFORMIN (GLUCOPHAGE-XR) 24 hr tablet 1,000 mg, 1,000 mg, Oral, BID WC, Shah, Pratik D, DO, 1,000 mg at 05/02/23 0900   methylPREDNISolone sodium succinate (SOLU-MEDROL) 40 mg/mL injection 40 mg, 40 mg, Intravenous, Q12H, Shah, Pratik D, DO, 40 mg at 05/01/23 2359   nicotine (NICODERM CQ - dosed in mg/24 hours) patch 21 mg, 21 mg, Transdermal, Q24H, Shah, Pratik D, DO, 21 mg at 05/01/23 1857   ondansetron (ZOFRAN) tablet 4 mg, 4 mg, Oral, Q6H PRN **OR** ondansetron (ZOFRAN) injection 4 mg, 4 mg, Intravenous, Q6H PRN, Sherryll Burger, Pratik D, DO   oseltamivir (TAMIFLU) capsule 75 mg, 75 mg, Oral, BID, Sherryll Burger, Pratik D, DO, 75 mg at 05/02/23 0901   oxyCODONE (OXYCONTIN) 12 hr tablet 30 mg, 30 mg, Oral, BID, Sherryll Burger, Pratik D, DO, 30 mg at 05/02/23 0900   oxyCODONE-acetaminophen (PERCOCET/ROXICET) 5-325 MG per tablet 1 tablet, 1 tablet, Oral, Q6H PRN, Sherryll Burger, Pratik D, DO, 1 tablet at 05/01/23 1706   pantoprazole (PROTONIX) EC tablet 40 mg, 40 mg, Oral, Daily, Sherryll Burger, Pratik D, DO, 40 mg at 05/02/23 0900   polyethylene glycol (MIRALAX / GLYCOLAX) packet 17 g, 17 g, Oral, BID, Sherryll Burger, Pratik D, DO, 17 g at 05/02/23 0900   senna-docusate  (Senokot-S) tablet 2 tablet, 2 tablet, Oral, QHS PRN, Sherryll Burger, Pratik D, DO    ALLERGIES   Pregabalin and Sulfamethoxazole-trimethoprim     REVIEW OF SYSTEMS    Review of Systems:  Gen:  Denies  fever, sweats, chills weigh loss  HEENT: Denies blurred vision, double vision, ear pain, eye pain, hearing loss, nose bleeds, sore throat Cardiac:  No dizziness, chest pain or heaviness, chest tightness,edema Resp:   reports dyspnea chronically  Gi: Denies swallowing difficulty, stomach pain, nausea or vomiting, diarrhea, constipation, bowel incontinence Gu:  Denies bladder incontinence, burning urine Ext:   Denies Joint pain, stiffness or swelling Skin: Denies  skin rash, easy  bruising or bleeding or hives Endoc:  Denies polyuria, polydipsia , polyphagia or weight change Psych:   Denies depression, insomnia or hallucinations   Other:  All other systems negative   VS: BP 116/79 (BP Location: Left Arm)   Pulse 80   Temp 97.8 F (36.6 C)   Resp 20   Ht 5\' 4"  (1.626 m)   Wt 75.8 kg   SpO2 94%   BMI 28.68 kg/m      PHYSICAL EXAM    GENERAL:NAD, no fevers, chills, no weakness no fatigue HEAD: Normocephalic, atraumatic.  EYES: Pupils equal, round, reactive to light. Extraocular muscles intact. No scleral icterus.  MOUTH: Moist mucosal membrane. Dentition intact. No abscess noted.  EAR, NOSE, THROAT: Clear without exudates. No external lesions.  NECK: Supple. No thyromegaly. No nodules. No JVD.  PULMONARY: decreased breath sounds with mild rhonchi worse at bases bilaterally.  CARDIOVASCULAR: S1 and S2. Regular rate and rhythm. No murmurs, rubs, or gallops. No edema. Pedal pulses 2+ bilaterally.  GASTROINTESTINAL: Soft, nontender, nondistended. No masses. Positive bowel sounds. No hepatosplenomegaly.  MUSCULOSKELETAL: No swelling, clubbing, or edema. Range of motion full in all extremities.  NEUROLOGIC: Cranial nerves II through XII are intact. No gross focal neurological  deficits. Sensation intact. Reflexes intact.  SKIN: No ulceration, lesions, rashes, or cyanosis. Skin warm and dry. Turgor intact.  PSYCHIATRIC: Mood, affect within normal limits. The patient is awake, alert and oriented x 3. Insight, judgment intact.       IMAGING       ASSESSMENT/PLAN   Acute on chronic hypoxemic respiratory failure   - thus far infectious workup is + for influenza only, patient is on tamiflu   - will narrow antimicrobial regimen to doxycycline BID   - CRP is normal    - continue steroids with tapering protocol      - nebulizer therapy per COPD carepath    -CXR above with hazy opacification of RLL but clear costophrenic and cardiophrenic angle- have ordered repeat for today    - no need for pulmicort nebulizer therapy   -agree with doxy bid   Bibasilar atelectasis     - chest physiotherapy with incentive spirometry and flutter valve   - PT/OT when able     Tobacco dependence    - nicotine replacement has been provided   Allergic rhinosinusitis   - continue [place] nasal spary    Insomnia     Trazadone QHS      Thank you for allowing me to participate in the care of this patient.   Patient/Family are satisfied with care plan and all questions have been answered.    Provider disclosure: Patient with at least one acute or chronic illness or injury that poses a threat to life or bodily function and is being managed actively during this encounter.  All of the below services have been performed independently by signing provider:  review of prior documentation from internal and or external health records.  Review of previous and current lab results.  Interview and comprehensive assessment during patient visit today. Review of current and previous chest radiographs/CT scans. Discussion of management and test interpretation with health care team and patient/family.   This document was prepared using Dragon voice recognition software and may include  unintentional dictation errors.     Vida Rigger, M.D.  Division of Pulmonary & Critical Care Medicine

## 2023-05-03 ENCOUNTER — Inpatient Hospital Stay

## 2023-05-03 DIAGNOSIS — J9621 Acute and chronic respiratory failure with hypoxia: Secondary | ICD-10-CM | POA: Diagnosis not present

## 2023-05-03 LAB — C-REACTIVE PROTEIN: CRP: 1.3 mg/dL — ABNORMAL HIGH (ref <1.0)

## 2023-05-03 LAB — GLUCOSE, CAPILLARY
Glucose-Capillary: 172 mg/dL — ABNORMAL HIGH (ref 70–99)
Glucose-Capillary: 130 mg/dL — ABNORMAL HIGH (ref 70–99)
Glucose-Capillary: 199 mg/dL — ABNORMAL HIGH (ref 70–99)
Glucose-Capillary: 131 mg/dL — ABNORMAL HIGH (ref 70–99)

## 2023-05-03 MED ORDER — IOHEXOL 300 MG/ML  SOLN
75.0000 mL | Freq: Once | INTRAMUSCULAR | Status: AC | PRN
  Administered 2023-05-03: 75 mL via INTRAVENOUS

## 2023-05-03 MED ORDER — NICOTINE 14 MG/24HR TD PT24
14.0000 mg | MEDICATED_PATCH | TRANSDERMAL | Status: DC
  Administered 2023-05-03 – 2023-05-04 (×2): 14 mg via TRANSDERMAL
  Filled 2023-05-03 (×2): qty 1

## 2023-05-03 MED ORDER — TRAZODONE HCL 50 MG PO TABS: 50.0000 mg | ORAL_TABLET | Freq: Every evening | ORAL | Status: DC | PRN

## 2023-05-03 MED ORDER — BENZONATATE 100 MG PO CAPS
200.0000 mg | ORAL_CAPSULE | Freq: Three times a day (TID) | ORAL | Status: DC
  Administered 2023-05-03 – 2023-05-05 (×7): 200 mg via ORAL
  Filled 2023-05-03 (×7): qty 2

## 2023-05-03 MED ORDER — PREDNISONE 20 MG PO TABS
40.0000 mg | ORAL_TABLET | Freq: Every day | ORAL | Status: DC
  Administered 2023-05-04: 40 mg via ORAL
  Filled 2023-05-03: qty 2

## 2023-05-03 NOTE — Progress Notes (Signed)
 PULMONOLOGY         Date: 05/03/2023,   MRN# [id number] Laurie Berg 09-04-1960     AdmissionWeight: 75.8 kg                 CurrentWeight: 75.8 kg  Referring provider: Dr Renae Gloss   CHIEF COMPLAINT:   Acute on chronic hypoxemic respiratory failure   HISTORY OF PRESENT ILLNESS   This is a 63 yo F with history of lifelong smoking, COPD with chronic hypoxemia on home Trelegy once daily inhaler, previous bouts of COVID19, CAD, lumbago and cervical radiculopathy, DM2, lung nodules who came in with complaints of wheezing and dyspnea x 4 days with increased O2 requirement. She generally uses 2L/min [place] supplemental O2 at home but noted oxygen was dropping in the mid 70s.  She does continue to smoke and we discussed smoking cessation in the past. She was seen in ER with labored breathing, tachypnea, tachycardia and required 6L/min  to reach normoxia.  She had chest imaging performed noted to have bronchitic changes suggestive of COPD exacerbation but absence of florid pneumonia.  She does report onset of pedal edema recently.  She was placed on empiric antibiotics with vanco/cefepime/flagyl and started on LR for IVF. She had RVP which was negative.  She had CT head which was normal besides age related microvascular disease.   Patient was discharged 04/27/23 with acute COPD exacerbation.  She now has acute exacerbation again with Influenza A infection acutely.  PCCM consultation for additional evaluation and management.   05/02/23 - patient seen at bedside , moderately rhonchorous. Family present at bedside. O2 weaned down to 3L/min. She is having forceful cough with expectoration of phlegm.  CRP is flat compared to previous. Have reduced steroids to 40IV daily. Will plan to finish 7d course of doxy. PT/OT and IS 05/03/23- Patient seen at bedside. She is accompanied by daughter. She is feeling well with less dyspnea at rest. She is no longer tachypneic and breathing is non-labored.  She  had CT chest today with findings of inflammatory changes acutely and chronic emphysematous changes. She previously had RLL nodule which is still there but less prominent smaller and not worrisome on this study.  Ive reviewed these findings with patient.    PAST MEDICAL HISTORY   Past Medical History:  Diagnosis Date   Cervical radiculopathy    Chronic pain syndrome    Colon polyps    COPD (chronic obstructive pulmonary disease) (HCC)    Coronary artery calcification seen on CT scan    a. 04/2021 High Res CT Chest: Ao atherosclerosis, cor Ca2+; b. 07/2021 MV: EF 69%, no isch/infarct, LAD/LCX Ca2+ noted; c. 02/2022 CT Chest: Cor and Ao atherosclerosis.   COVID-19 virus infection    Diabetes mellitus without complication (HCC)    History of echocardiogram    a. 08/2021 Echo: EF 55-60%, no rwma, nl RV fxn.   Lung nodule    a. 02/2022 CT Chest: RLL 7mm solid subpleural pulm nodule, prev 16mm-->rec thoracic surgery eval.   Sepsis (HCC)    Tobacco abuse      SURGICAL HISTORY   Past Surgical History:  Procedure Laterality Date   CARPAL TUNNEL RELEASE     COLONOSCOPY WITH PROPOFOL N/A 10/21/2019   Procedure: COLONOSCOPY WITH PROPOFOL;  Surgeon: Pasty Spillers, MD;  Location: [facility] END[facility];  Service: Endoscopy;  Laterality: N/A;   DILATION AND CURETTAGE OF UTERUS     TUBAL LIGATION  FAMILY HISTORY   History reviewed. No pertinent family history.   SOCIAL HISTORY   Social History   Tobacco Use   Smoking status: Every Day    Current packs/day: 1.75    Average packs/day: 1.8 packs/day for 48.0 years (84.0 ttl pk-yrs)    Types: Cigarettes   Smokeless tobacco: Never  Vaping Use   Vaping status: Never Used  Substance Use Topics   Alcohol use: Not Currently   Drug use: Never     MEDICATIONS    Home Medication:    Current Medication:  Current Facility-Administered Medications:    acetaminophen (TYLENOL) tablet 650 mg, 650 mg, Oral, Q6H PRN, 650 mg at 05/03/23  0431 **OR** acetaminophen (TYLENOL) suppository 650 mg, 650 mg, Rectal, Q6H PRN, Sherryll Burger, Pratik D, DO   albuterol (PROVENTIL) (2.5 MG/3ML) 0.083% nebulizer solution 2.5 mg, 2.5 mg, Inhalation, Q4H PRN, Georgeann Oppenheim, Sudheer B, MD   aspirin chewable tablet 81 mg, 81 mg, Oral, Daily, Sherryll Burger, Pratik D, DO, 81 mg at 05/02/23 0859   atorvastatin (LIPITOR) tablet 20 mg, 20 mg, Oral, Daily, Sreenath, Sudheer B, MD, 20 mg at 05/02/23 1755   cyclobenzaprine (FLEXERIL) tablet 10 mg, 10 mg, Oral, BID PRN, Sherryll Burger, Pratik D, DO, 10 mg at 05/01/23 0040   DULoxetine (CYMBALTA) DR capsule 90 mg, 90 mg, Oral, Daily, Sherryll Burger, Pratik D, DO, 90 mg at 05/02/23 0859   enoxaparin (LOVENOX) injection 40 mg, 40 mg, Subcutaneous, Q24H, Shah, Pratik D, DO, 40 mg at 05/02/23 1755   insulin aspart (novoLOG) injection 0-5 Units, 0-5 Units, Subcutaneous, QHS, Shah, Pratik D, DO, 2 Units at 05/01/23 2120   insulin aspart (novoLOG) injection 0-6 Units, 0-6 Units, Subcutaneous, TID WC, Shah, Pratik D, DO, 1 Units at 05/01/23 1702   ipratropium-albuterol (DUONEB) 0.5-2.5 (3) MG/3ML nebulizer solution 3 mL, 3 mL, Nebulization, Q6H, Shah, Pratik D, DO, 3 mL at 05/03/23 0720   linaclotide (LINZESS) capsule 72 mcg, 72 mcg, Oral, q morning, Sherryll Burger, Pratik D, DO, 72 mcg at 05/02/23 0901   linagliptin (TRADJENTA) tablet 5 mg, 5 mg, Oral, Daily, Sherryll Burger, Pratik D, DO, 5 mg at 05/02/23 0901   loratadine (CLARITIN) tablet 10 mg, 10 mg, Oral, Daily, Sherryll Burger, Pratik D, DO, 10 mg at 05/02/23 0859   metFORMIN (GLUCOPHAGE-XR) 24 hr tablet 1,000 mg, 1,000 mg, Oral, BID WC, Shah, Pratik D, DO, 1,000 mg at 05/02/23 1755   methylPREDNISolone sodium succinate (SOLU-MEDROL) 40 mg/mL injection 40 mg, 40 mg, Intravenous, Q24H, Sirius Woodford, MD   nicotine (NICODERM CQ - dosed in mg/24 hours) patch 21 mg, 21 mg, Transdermal, Q24H, Shah, Pratik D, DO, 21 mg at 05/02/23 1756   ondansetron (ZOFRAN) tablet 4 mg, 4 mg, Oral, Q6H PRN **OR** ondansetron (ZOFRAN) injection 4 mg, 4 mg,  Intravenous, Q6H PRN, Sherryll Burger, Pratik D, DO   oseltamivir (TAMIFLU) capsule 75 mg, 75 mg, Oral, BID, Sherryll Burger, Pratik D, DO, 75 mg at 05/02/23 2133   oxyCODONE (OXYCONTIN) 12 hr tablet 30 mg, 30 mg, Oral, BID, Sherryll Burger, Pratik D, DO, 30 mg at 05/02/23 2133   oxyCODONE-acetaminophen (PERCOCET/ROXICET) 5-325 MG per tablet 1 tablet, 1 tablet, Oral, Q6H PRN, Sherryll Burger, Pratik D, DO, 1 tablet at 05/02/23 1250   pantoprazole (PROTONIX) EC tablet 40 mg, 40 mg, Oral, Daily, Sherryll Burger, Pratik D, DO, 40 mg at 05/02/23 0900   polyethylene glycol (MIRALAX / GLYCOLAX) packet 17 g, 17 g, Oral, BID, Sherryll Burger, Pratik D, DO, 17 g at 05/02/23 2133   senna-docusate (Senokot-S) tablet 2 tablet, 2 tablet, Oral, QHS  PRN, Sherryll Burger, Pratik D, DO    ALLERGIES   Pregabalin and Sulfamethoxazole-trimethoprim     REVIEW OF SYSTEMS    Review of Systems:  Gen:  Denies  fever, sweats, chills weigh loss  HEENT: Denies blurred vision, double vision, ear pain, eye pain, hearing loss, nose bleeds, sore throat Cardiac:  No dizziness, chest pain or heaviness, chest tightness,edema Resp:   reports dyspnea chronically  Gi: Denies swallowing difficulty, stomach pain, nausea or vomiting, diarrhea, constipation, bowel incontinence Gu:  Denies bladder incontinence, burning urine Ext:   Denies Joint pain, stiffness or swelling Skin: Denies  skin rash, easy bruising or bleeding or hives Endoc:  Denies polyuria, polydipsia , polyphagia or weight change Psych:   Denies depression, insomnia or hallucinations   Other:  All other systems negative   VS: BP 128/75 (BP Location: Left Arm)   Pulse 86   Temp 98.3 F (36.8 C)   Resp 19   Ht 5\' 4"  (1.626 m)   Wt 75.8 kg   SpO2 95%   BMI 28.68 kg/m      PHYSICAL EXAM    GENERAL:NAD, no fevers, chills, no weakness no fatigue HEAD: Normocephalic, atraumatic.  EYES: Pupils equal, round, reactive to light. Extraocular muscles intact. No scleral icterus.  MOUTH: Moist mucosal membrane. Dentition intact.  No abscess noted.  EAR, NOSE, THROAT: Clear without exudates. No external lesions.  NECK: Supple. No thyromegaly. No nodules. No JVD.  PULMONARY: decreased breath sounds with mild rhonchi worse at bases bilaterally.  CARDIOVASCULAR: S1 and S2. Regular rate and rhythm. No murmurs, rubs, or gallops. No edema. Pedal pulses 2+ bilaterally.  GASTROINTESTINAL: Soft, nontender, nondistended. No masses. Positive bowel sounds. No hepatosplenomegaly.  MUSCULOSKELETAL: No swelling, clubbing, or edema. Range of motion full in all extremities.  NEUROLOGIC: Cranial nerves II through XII are intact. No gross focal neurological deficits. Sensation intact. Reflexes intact.  SKIN: No ulceration, lesions, rashes, or cyanosis. Skin warm and dry. Turgor intact.  PSYCHIATRIC: Mood, affect within normal limits. The patient is awake, alert and oriented x 3. Insight, judgment intact.       IMAGING       ASSESSMENT/PLAN   Acute on chronic hypoxemic respiratory failure   - thus far infectious workup is + for influenza only, patient is on tamiflu   - doxycycline has been dcd   - CRP is normal    - continue steroids with tapering protocol  -currently on 40mg     - nebulizer therapy per COPD carepath    -reviewed CT chest with ground glass opacification consistent with influenza otherwise chronic emphysematous lungs without pulmonary edema, effusion, interstitial lung disease, fibrosis, tumors, masses and previously noted RLL 0.7cm lung subsolid nodule is barely visible.   - no need for pulmicort nebulizer therapy   Bibasilar atelectasis     - chest physiotherapy with incentive spirometry and flutter valve   - PT/OT when able    Tobacco dependence    - patient has not smoked in 2 weeks - commended her on this great achievement, reduced transdermal nicotine patch from 21 to 14mg    Allergic rhinosinusitis   - continue [place] nasal spary    Insomnia     Trazadone QHS      Thank you for allowing me to  participate in the care of this patient.   Patient/Family are satisfied with care plan and all questions have been answered.    Provider disclosure: Patient with at least one acute or chronic illness or injury  that poses a threat to life or bodily function and is being managed actively during this encounter.  All of the below services have been performed independently by signing provider:  review of prior documentation from internal and or external health records.  Review of previous and current lab results.  Interview and comprehensive assessment during patient visit today. Review of current and previous chest radiographs/CT scans. Discussion of management and test interpretation with health care team and patient/family.   This document was prepared using Dragon voice recognition software and may include unintentional dictation errors.     Vida Rigger, M.D.  Division of Pulmonary & Critical Care Medicine

## 2023-05-03 NOTE — Progress Notes (Signed)
 PROGRESS NOTE    Laurie Berg  LKG:[id number] DOB: February 28, 1960 DOA: 04/30/2023 PCP: Leanna Sato, MD    Brief Narrative:   63 y.o. female with medical history significant for CAD, type 2 diabetes, and COPD with chronic hypoxemia recently discharged after hospitalization for acute on chronic hypoxemic respiratory failure with acute decompensated COPD on 3/14.  At that time, she was treated for her COPD exacerbation and was thought to have a possible viral syndrome, however COVID, flu, RSV and RVP testing were all negative.  Since discharge, she has had worsening shortness of breath and cough and is now noted to be influenza A positive.  She denies any current fevers or chills or chest pain.  She is noted to have a dry cough with no sputum production.  She states that she has stopped smoking since her recent admission.   ED Course: Vital signs stable and patient on 4 L nasal cannula without any acute respiratory distress or tachypnea noted.  Positive for influenza A.  Chest x-ray with emphysema no other acute findings.  Leukocytosis of 13,200 noted and glucose 66.  Daughter at bedside.   3/18.  Patient did not desaturate with being on 4 L with physical therapy today down to 88%.  Patient does not feel back to her baseline yet.  3/20: Persistent cough and SOB.   Assessment & Plan:   Principal Problem:   Acute on chronic respiratory failure with hypoxia (HCC) Active Problems:   Influenza A   COPD with acute exacerbation (HCC)   Tobacco use disorder   Coronary artery disease   GERD without esophagitis   Type 2 diabetes mellitus without complications (HCC)   Overweight (BMI 25.0-29.9)   Acute respiratory failure with hypoxia (HCC)  * Acute on chronic respiratory failure with hypoxia (HCC) Likely result of dependent COPD and continued smoking in the setting of recent influenza infection Plan: Continue steroid taper Continue BD therapy Pulmonary hygiene IS use Doxycycline twice  daily   Influenza A On Tamiflu Empiric doxycycline   COPD with acute exacerbation (HCC) As above   Overweight (BMI 25.0-29.9) BMI 28.68.   Type 2 diabetes mellitus without complications (HCC) On sliding scale insulin, hemoglobin A1c 6.7.   GERD without esophagitis On Protonix   Coronary artery disease On aspirin   Tobacco use disorder Nicotine patch  History of pulmonary nodules Noted on CAT scan 1 year ago.  Follow-up as outpatient is unclear.  Notified pulmonology of patient's concern.  Will reorder CT chest with contrast     DVT prophylaxis: Lovenox Code Status: Full Family Communication: Daughter at bedside 3/19, 3/20 Disposition Plan: Status is: Inpatient Remains inpatient appropriate because: Acute on chronic hypoxic respiratory failure, COPD flare, influenza A   Level of care: Med-Surg  Consultants:  Pulmonology  Procedures:  None  Antimicrobials: Doxycycline   Subjective: Seen and examined.  Main complaint today is cough.  Concerned about pulmonary nodules noted on CAT scan 1 year ago  Objective: Vitals:   05/03/23 0212 05/03/23 0431 05/03/23 0858 05/03/23 0902  BP:  128/75  112/68  Pulse:  86  86  Resp:  19 15 15   Temp:  98.3 F (36.8 C) 98 F (36.7 C) 98 F (36.7 C)  TempSrc:      SpO2: 92% 95%  96%  Weight:      Height:        Intake/Output Summary (Last 24 hours) at 05/03/2023 1121 Last data filed at 05/02/2023 1906 Gross per 24 hour  Intake 300 ml  Output --  Net 300 ml   Filed Weights   04/30/23 1437  Weight: 75.8 kg    Examination:  General exam: Appears fatigued Respiratory system: Diminished breath sounds bilaterally.  Left predominant crackles.  Mild end expiratory wheeze.  Normal work of breathing.  3.5 L Cardiovascular system: S1-S2, RRR, no murmurs, no pedal edema Gastrointestinal system: Soft, NT/ND, normal bowel sounds Central nervous system: Alert and oriented. No focal neurological deficits. Extremities:  Symmetric 5 x 5 power. Skin: No rashes, lesions or ulcers Psychiatry: Judgement and insight appear normal. Mood & affect flattened.     Data Reviewed: I have personally reviewed following labs and imaging studies  CBC: Recent Labs  Lab 04/27/23 0508 04/30/23 1438 04/30/23 2040 05/01/23 0430  WBC 6.4 13.2* 10.4 9.9  NEUTROABS  --  8.9*  --   --   HGB 12.2 13.9 13.3 13.2  HCT 36.7 42.9 39.7 40.1  MCV 90.0 88.3 88.4 88.9  PLT 227 374 332 356   Basic Metabolic Panel: Recent Labs  Lab 04/27/23 0508 04/30/23 1438 04/30/23 2040 05/01/23 0430  NA 137 138  --  134*  K 4.2 3.5  --  4.0  CL 95* 98  --  97*  CO2 32 29  --  26  GLUCOSE 222* 66*  --  232*  BUN 18 22  --  18  CREATININE 0.72 0.70 0.88 0.79  CALCIUM 8.6* 9.0  --  9.3  MG  --   --   --  1.9   GFR: Estimated Creatinine Clearance: 73.6 mL/min (by C-G formula based on SCr of 0.79 mg/dL). Liver Function Tests: No results for input(s): "AST", "ALT", "ALKPHOS", "BILITOT", "PROT", "ALBUMIN" in the last 168 hours. No results for input(s): "LIPASE", "AMYLASE" in the last 168 hours. No results for input(s): "AMMONIA" in the last 168 hours. Coagulation Profile: No results for input(s): "INR", "PROTIME" in the last 168 hours. Cardiac Enzymes: No results for input(s): "CKTOTAL", "CKMB", "CKMBINDEX", "TROPONINI" in the last 168 hours. BNP (last 3 results) No results for input(s): "PROBNP" in the last 8760 hours. HbA1C: No results for input(s): "HGBA1C" in the last 72 hours. CBG: Recent Labs  Lab 05/02/23 0751 05/02/23 1141 05/02/23 1643 05/02/23 2121 05/03/23 0835  GLUCAP 136* 82 121* 177* 172*   Lipid Profile: No results for input(s): "CHOL", "HDL", "LDLCALC", "TRIG", "CHOLHDL", "LDLDIRECT" in the last 72 hours. Thyroid Function Tests: No results for input(s): "TSH", "T4TOTAL", "FREET4", "T3FREE", "THYROIDAB" in the last 72 hours. Anemia Panel: No results for input(s): "VITAMINB12", "FOLATE", "FERRITIN", "TIBC",  "IRON", "RETICCTPCT" in the last 72 hours. Sepsis Labs: No results for input(s): "PROCALCITON", "LATICACIDVEN" in the last 168 hours.  Recent Results (from the past 240 hours)  Resp panel by RT-PCR (RSV, Flu A&B, Covid) Anterior Nasal Swab     Status: None   Collection Time: 04/23/23  8:16 PM   Specimen: Anterior Nasal Swab  Result Value Ref Range Status   SARS Coronavirus 2 by RT PCR NEGATIVE NEGATIVE Final    Comment: (NOTE) SARS-CoV-2 target nucleic acids are NOT DETECTED.  The SARS-CoV-2 RNA is generally detectable in upper respiratory specimens during the acute phase of infection. The lowest concentration of SARS-CoV-2 viral copies this assay can detect is 138 copies/mL. A negative result does not preclude SARS-Cov-2 infection and should not be used as the sole basis for treatment or other patient management decisions. A negative result may occur with  improper specimen collection/handling, submission of specimen  other than nasopharyngeal swab, presence of viral mutation(s) within the areas targeted by this assay, and inadequate number of viral copies(<138 copies/mL). A negative result must be combined with clinical observations, patient history, and epidemiological information. The expected result is Negative.  Fact Sheet for Patients:  BloggerCourse.com  Fact Sheet for Healthcare Providers:  SeriousBroker.it  This test is no t yet approved or cleared by the [place] FDA and  has been authorized for detection and/or diagnosis of SARS-CoV-2 by FDA under an Emergency Use Authorization (EUA). This EUA will remain  in effect (meaning this test can be used) for the duration of the COVID-19 declaration under Section 564(b)(1) of the Act, 21 U.S.C.section 360bbb-3(b)(1), unless the authorization is terminated  or revoked sooner.       Influenza A by PCR NEGATIVE NEGATIVE Final   Influenza B by PCR NEGATIVE NEGATIVE Final     Comment: (NOTE) The Xpert Xpress SARS-CoV-2/FLU/RSV plus assay is intended as an aid in the diagnosis of influenza from Nasopharyngeal swab specimens and should not be used as a sole basis for treatment. Nasal washings and aspirates are unacceptable for Xpert Xpress SARS-CoV-2/FLU/RSV testing.  Fact Sheet for Patients: BloggerCourse.com  Fact Sheet for Healthcare Providers: SeriousBroker.it  This test is not yet approved or cleared by the [place] FDA and has been authorized for detection and/or diagnosis of SARS-CoV-2 by FDA under an Emergency Use Authorization (EUA). This EUA will remain in effect (meaning this test can be used) for the duration of the COVID-19 declaration under Section 564(b)(1) of the Act, 21 U.S.C. section 360bbb-3(b)(1), unless the authorization is terminated or revoked.     Resp Syncytial Virus by PCR NEGATIVE NEGATIVE Final    Comment: (NOTE) Fact Sheet for Patients: BloggerCourse.com  Fact Sheet for Healthcare Providers: SeriousBroker.it  This test is not yet approved or cleared by the [place] FDA and has been authorized for detection and/or diagnosis of SARS-CoV-2 by FDA under an Emergency Use Authorization (EUA). This EUA will remain in effect (meaning this test can be used) for the duration of the COVID-19 declaration under Section 564(b)(1) of the Act, 21 U.S.C. section 360bbb-3(b)(1), unless the authorization is terminated or revoked.  Performed at [facility], [address] [place], [place] [address]   Blood Culture (routine x 2)     Status: None   Collection Time: 04/23/23  8:16 PM   Specimen: BLOOD  Result Value Ref Range Status   Specimen Description BLOOD LEFT ANTECUBITAL  Final   Special Requests   Final    BOTTLES DRAWN AEROBIC AND ANAEROBIC Blood Culture results may not be optimal due to an inadequate volume  of blood received in culture bottles   Culture   Final    NO GROWTH 5 DAYS Performed at [facility], [address] [place], [place] [address]    Report Status 04/28/2023 FINAL  Final  Blood Culture (routine x 2)     Status: None   Collection Time: 04/23/23  8:21 PM   Specimen: BLOOD  Result Value Ref Range Status   Specimen Description BLOOD RIGHT ANTECUBITAL  Final   Special Requests   Final    BOTTLES DRAWN AEROBIC AND ANAEROBIC Blood Culture adequate volume   Culture   Final    NO GROWTH 5 DAYS Performed at [facility], [address], [place], [place] [address]    Report Status 04/28/2023 FINAL  Final  Respiratory (~20 pathogens) panel by PCR  Status: None   Collection Time: 04/24/23  8:25 AM   Specimen: Nasopharyngeal Swab; Respiratory  Result Value Ref Range Status   Adenovirus NOT DETECTED NOT DETECTED Final   Coronavirus 229E NOT DETECTED NOT DETECTED Final    Comment: (NOTE) The Coronavirus on the Respiratory Panel, DOES NOT test for the novel  Coronavirus (2019 nCoV)    Coronavirus HKU1 NOT DETECTED NOT DETECTED Final   Coronavirus NL63 NOT DETECTED NOT DETECTED Final   Coronavirus OC43 NOT DETECTED NOT DETECTED Final   Metapneumovirus NOT DETECTED NOT DETECTED Final   Rhinovirus / Enterovirus NOT DETECTED NOT DETECTED Final   Influenza A NOT DETECTED NOT DETECTED Final   Influenza B NOT DETECTED NOT DETECTED Final   Parainfluenza Virus 1 NOT DETECTED NOT DETECTED Final   Parainfluenza Virus 2 NOT DETECTED NOT DETECTED Final   Parainfluenza Virus 3 NOT DETECTED NOT DETECTED Final   Parainfluenza Virus 4 NOT DETECTED NOT DETECTED Final   Respiratory Syncytial Virus NOT DETECTED NOT DETECTED Final   Bordetella pertussis NOT DETECTED NOT DETECTED Final   Bordetella Parapertussis NOT DETECTED NOT DETECTED Final   Chlamydophila pneumoniae NOT DETECTED NOT DETECTED Final   Mycoplasma pneumoniae NOT DETECTED NOT DETECTED Final    Comment:  Performed at [facility], [address] [place], [place] [address]  MRSA Next Gen by PCR, Nasal     Status: None   Collection Time: 04/26/23  9:00 AM   Specimen: Nasal Mucosa; Nasal Swab  Result Value Ref Range Status   MRSA by PCR Next Gen NOT DETECTED NOT DETECTED Final    Comment: (NOTE) The GeneXpert MRSA Assay (FDA approved for NASAL specimens only), is one component of a comprehensive MRSA colonization surveillance program. It is not intended to diagnose MRSA infection nor to guide or monitor treatment for MRSA infections. Test performance is not FDA approved in patients less than 55 years old. Performed at [facility], [address] [place], [place] [address]   Resp panel by RT-PCR (RSV, Flu A&B, Covid) Anterior Nasal Swab     Status: Abnormal   Collection Time: 04/30/23  4:27 PM   Specimen: Anterior Nasal Swab  Result Value Ref Range Status   SARS Coronavirus 2 by RT PCR NEGATIVE NEGATIVE Final    Comment: (NOTE) SARS-CoV-2 target nucleic acids are NOT DETECTED.  The SARS-CoV-2 RNA is generally detectable in upper respiratory specimens during the acute phase of infection. The lowest concentration of SARS-CoV-2 viral copies this assay can detect is 138 copies/mL. A negative result does not preclude SARS-Cov-2 infection and should not be used as the sole basis for treatment or other patient management decisions. A negative result may occur with  improper specimen collection/handling, submission of specimen other than nasopharyngeal swab, presence of viral mutation(s) within the areas targeted by this assay, and inadequate number of viral copies(<138 copies/mL). A negative result must be combined with clinical observations, patient history, and epidemiological information. The expected result is Negative.  Fact Sheet for Patients:  BloggerCourse.com  Fact Sheet for Healthcare Providers:   SeriousBroker.it  This test is no t yet approved or cleared by the [place] FDA and  has been authorized for detection and/or diagnosis of SARS-CoV-2 by FDA under an Emergency Use Authorization (EUA). This EUA will remain  in effect (meaning this test can be used) for the duration of the COVID-19 declaration under Section 564(b)(1) of the Act, 21 U.S.C.section 360bbb-3(b)(1), unless the authorization is terminated  or revoked sooner.  Influenza A by PCR POSITIVE (A) NEGATIVE Final   Influenza B by PCR NEGATIVE NEGATIVE Final    Comment: (NOTE) The Xpert Xpress SARS-CoV-2/FLU/RSV plus assay is intended as an aid in the diagnosis of influenza from Nasopharyngeal swab specimens and should not be used as a sole basis for treatment. Nasal washings and aspirates are unacceptable for Xpert Xpress SARS-CoV-2/FLU/RSV testing.  Fact Sheet for Patients: BloggerCourse.com  Fact Sheet for Healthcare Providers: SeriousBroker.it  This test is not yet approved or cleared by the [place] FDA and has been authorized for detection and/or diagnosis of SARS-CoV-2 by FDA under an Emergency Use Authorization (EUA). This EUA will remain in effect (meaning this test can be used) for the duration of the COVID-19 declaration under Section 564(b)(1) of the Act, 21 U.S.C. section 360bbb-3(b)(1), unless the authorization is terminated or revoked.     Resp Syncytial Virus by PCR NEGATIVE NEGATIVE Final    Comment: (NOTE) Fact Sheet for Patients: BloggerCourse.com  Fact Sheet for Healthcare Providers: SeriousBroker.it  This test is not yet approved or cleared by the [place] FDA and has been authorized for detection and/or diagnosis of SARS-CoV-2 by FDA under an Emergency Use Authorization (EUA). This EUA will remain in effect (meaning this test can be used)  for the duration of the COVID-19 declaration under Section 564(b)(1) of the Act, 21 U.S.C. section 360bbb-3(b)(1), unless the authorization is terminated or revoked.  Performed at [facility], [address] [place], [place] [address]          Radiology Studies: No results found.       Scheduled Meds:  aspirin  81 mg Oral Daily   atorvastatin  20 mg Oral Daily   benzonatate  200 mg Oral TID   DULoxetine  90 mg Oral Daily   enoxaparin (LOVENOX) injection  40 mg Subcutaneous Q24H   insulin aspart  0-5 Units Subcutaneous QHS   insulin aspart  0-6 Units Subcutaneous TID WC   ipratropium-albuterol  3 mL Nebulization Q6H   linaclotide  72 mcg Oral q morning   linagliptin  5 mg Oral Daily   loratadine  10 mg Oral Daily   metFORMIN  1,000 mg Oral BID WC   methylPREDNISolone (SOLU-MEDROL) injection  40 mg Intravenous Q24H   nicotine  21 mg Transdermal Q24H   oseltamivir  75 mg Oral BID   oxyCODONE  30 mg Oral BID   pantoprazole  40 mg Oral Daily   polyethylene glycol  17 g Oral BID   [START ON 05/04/2023] predniSONE  40 mg Oral Q breakfast   Continuous Infusions:   LOS: 2 days     Tresa Moore, MD [facility]   If 7PM-7AM, please contact night-coverage  05/03/2023, 11:21 AM

## 2023-05-04 DIAGNOSIS — J9621 Acute and chronic respiratory failure with hypoxia: Secondary | ICD-10-CM | POA: Diagnosis not present

## 2023-05-04 LAB — CBC WITH DIFFERENTIAL/PLATELET
Abs Immature Granulocytes: 0.21 10*3/uL — ABNORMAL HIGH (ref 0.00–0.07)
Basophils Absolute: 0 10*3/uL (ref 0.0–0.1)
Basophils Relative: 0 %
Eosinophils Absolute: 0.1 10*3/uL (ref 0.0–0.5)
Eosinophils Relative: 1 %
HCT: 41 % (ref 36.0–46.0)
Hemoglobin: 13.5 g/dL (ref 12.0–15.0)
Immature Granulocytes: 1 %
Lymphocytes Relative: 25 %
Lymphs Abs: 4.2 10*3/uL — ABNORMAL HIGH (ref 0.7–4.0)
MCH: 29.3 pg (ref 26.0–34.0)
MCHC: 32.9 g/dL (ref 30.0–36.0)
MCV: 88.9 fL (ref 80.0–100.0)
Monocytes Absolute: 0.8 10*3/uL (ref 0.1–1.0)
Monocytes Relative: 5 %
Neutro Abs: 11.3 10*3/uL — ABNORMAL HIGH (ref 1.7–7.7)
Neutrophils Relative %: 68 %
Platelets: 419 10*3/uL — ABNORMAL HIGH (ref 150–400)
RBC: 4.61 MIL/uL (ref 3.87–5.11)
RDW: 15.6 % — ABNORMAL HIGH (ref 11.5–15.5)
WBC: 16.6 10*3/uL — ABNORMAL HIGH (ref 4.0–10.5)
nRBC: 0 % (ref 0.0–0.2)

## 2023-05-04 LAB — BASIC METABOLIC PANEL
Anion gap: 12 (ref 5–15)
BUN: 21 mg/dL (ref 8–23)
CO2: 26 mmol/L (ref 22–32)
Calcium: 8.8 mg/dL — ABNORMAL LOW (ref 8.9–10.3)
Chloride: 100 mmol/L (ref 98–111)
Creatinine, Ser: 0.86 mg/dL (ref 0.44–1.00)
GFR, Estimated: >60 mL/min (ref >60)
Glucose, Bld: 117 mg/dL — ABNORMAL HIGH (ref 70–99)
Potassium: 4.1 mmol/L (ref 3.5–5.1)
Sodium: 138 mmol/L (ref 135–145)

## 2023-05-04 LAB — GLUCOSE, CAPILLARY
Glucose-Capillary: 98 mg/dL (ref 70–99)
Glucose-Capillary: 161 mg/dL — ABNORMAL HIGH (ref 70–99)
Glucose-Capillary: 197 mg/dL — ABNORMAL HIGH (ref 70–99)
Glucose-Capillary: 174 mg/dL — ABNORMAL HIGH (ref 70–99)
Glucose-Capillary: 209 mg/dL — ABNORMAL HIGH (ref 70–99)

## 2023-05-04 MED ORDER — PREDNISONE 20 MG PO TABS
30.0000 mg | ORAL_TABLET | Freq: Every day | ORAL | Status: DC
  Administered 2023-05-05: 30 mg via ORAL
  Filled 2023-05-04: qty 1

## 2023-05-04 NOTE — Care Management Important Message (Signed)
 Important Message  Patient Details  Name: Laurie Berg MRN: [id number] Date of Birth: [birth year]   Important Message Given:  Yes - Medicare IM     Cristela Blue, CMA 05/04/2023, 11:24 AM

## 2023-05-04 NOTE — Progress Notes (Signed)
 PROGRESS NOTE    Laurie Berg  WUJ:[id number] DOB: Jul 14, 1960 DOA: 04/30/2023 PCP: Leanna Sato, MD    Brief Narrative:   63 y.o. female with medical history significant for CAD, type 2 diabetes, and COPD with chronic hypoxemia recently discharged after hospitalization for acute on chronic hypoxemic respiratory failure with acute decompensated COPD on 3/14.  At that time, she was treated for her COPD exacerbation and was thought to have a possible viral syndrome, however COVID, flu, RSV and RVP testing were all negative.  Since discharge, she has had worsening shortness of breath and cough and is now noted to be influenza A positive.  She denies any current fevers or chills or chest pain.  She is noted to have a dry cough with no sputum production.  She states that she has stopped smoking since her recent admission.   ED Course: Vital signs stable and patient on 4 L nasal cannula without any acute respiratory distress or tachypnea noted.  Positive for influenza A.  Chest x-ray with emphysema no other acute findings.  Leukocytosis of 13,200 noted and glucose 66.  Daughter at bedside.   3/18.  Patient did not desaturate with being on 4 L with physical therapy today down to 88%.  Patient does not feel back to her baseline yet.  3/20: Persistent cough and SOB. 3/21: Improved   Assessment & Plan:   Principal Problem:   Acute on chronic respiratory failure with hypoxia (HCC) Active Problems:   Influenza A   COPD with acute exacerbation (HCC)   Tobacco use disorder   Coronary artery disease   GERD without esophagitis   Type 2 diabetes mellitus without complications (HCC)   Overweight (BMI 25.0-29.9)   Acute respiratory failure with hypoxia (HCC)  * Acute on chronic respiratory failure with hypoxia (HCC) Likely result of dependent COPD and continued smoking in the setting of recent influenza infection Plan: DC IV Solu-Medrol Start p.o. prednisone 40 mg daily x 5 days Continue BD  therapy Pulmonary hygiene IS use Doxycycline twice daily Anticipate discharge 3/22   Influenza A Continue 5-day course of oseltamivir, currently day 4/5 Empiric doxycycline   COPD with acute exacerbation (HCC) As above   Overweight (BMI 25.0-29.9) BMI 28.68.   Type 2 diabetes mellitus without complications (HCC) On sliding scale insulin, hemoglobin A1c 6.7.   GERD without esophagitis On Protonix   Coronary artery disease On aspirin   Tobacco use disorder Nicotine patch  History of pulmonary nodules Noted on CAT scan 1 year ago.  Follow-up as outpatient is unclear.  Notified pulmonology of patient's concern.  Will reorder CT chest with contrast     DVT prophylaxis: Lovenox Code Status: Full Family Communication: Daughter at bedside 3/19, 3/20, 3/21 Disposition Plan: Status is: Inpatient Remains inpatient appropriate because: Acute on chronic hypoxic respiratory failure, COPD flare, influenza A   Level of care: Med-Surg  Consultants:  Pulmonology  Procedures:  None  Antimicrobials: Doxycycline   Subjective: Seen and examined.  Shortness of breath improving.  Cough improving.  Objective: Vitals:   05/03/23 2006 05/04/23 0427 05/04/23 0724 05/04/23 0735  BP: 115/76 (!) 117/100  116/72  Pulse: 89 98  97  Resp: 20 18  18   Temp: 98.2 F (36.8 C) 98.2 F (36.8 C)  (!) 97.5 F (36.4 C)  TempSrc: Oral Oral  Axillary  SpO2: 92% 92% 91% 97%  Weight:      Height:        Intake/Output Summary (Last 24 hours)  at 05/04/2023 1212 Last data filed at 05/04/2023 1026 Gross per 24 hour  Intake 240 ml  Output --  Net 240 ml   Filed Weights   04/30/23 1437  Weight: 75.8 kg    Examination:  General exam: NAD Respiratory system: Lung sounds improved.  Equal air entry.  Mild left crackles.  No wheeze.  Normal work of breathing.  3 L Cardiovascular system: S1-S2, RRR, no murmurs, no pedal edema Gastrointestinal system: Soft, NT/ND, normal bowel  sounds Central nervous system: Alert and oriented. No focal neurological deficits. Extremities: Symmetric 5 x 5 power. Skin: No rashes, lesions or ulcers Psychiatry: Judgement and insight appear normal. Mood & affect flattened.     Data Reviewed: I have personally reviewed following labs and imaging studies  CBC: Recent Labs  Lab 04/30/23 1438 04/30/23 2040 05/01/23 0430 05/04/23 0816  WBC 13.2* 10.4 9.9 16.6*  NEUTROABS 8.9*  --   --  11.3*  HGB 13.9 13.3 13.2 13.5  HCT 42.9 39.7 40.1 41.0  MCV 88.3 88.4 88.9 88.9  PLT 374 332 356 419*   Basic Metabolic Panel: Recent Labs  Lab 04/30/23 1438 04/30/23 2040 05/01/23 0430 05/04/23 0816  NA 138  --  134* 138  K 3.5  --  4.0 4.1  CL 98  --  97* 100  CO2 29  --  26 26  GLUCOSE 66*  --  232* 117*  BUN 22  --  18 21  CREATININE 0.70 0.88 0.79 0.86  CALCIUM 9.0  --  9.3 8.8*  MG  --   --  1.9  --    GFR: Estimated Creatinine Clearance: 68.4 mL/min (by C-G formula based on SCr of 0.86 mg/dL). Liver Function Tests: No results for input(s): "AST", "ALT", "ALKPHOS", "BILITOT", "PROT", "ALBUMIN" in the last 168 hours. No results for input(s): "LIPASE", "AMYLASE" in the last 168 hours. No results for input(s): "AMMONIA" in the last 168 hours. Coagulation Profile: No results for input(s): "INR", "PROTIME" in the last 168 hours. Cardiac Enzymes: No results for input(s): "CKTOTAL", "CKMB", "CKMBINDEX", "TROPONINI" in the last 168 hours. BNP (last 3 results) No results for input(s): "PROBNP" in the last 8760 hours. HbA1C: No results for input(s): "HGBA1C" in the last 72 hours. CBG: Recent Labs  Lab 05/03/23 1142 05/03/23 1710 05/03/23 2007 05/04/23 0730 05/04/23 1140  GLUCAP 130* 199* 131* 98 161*   Lipid Profile: No results for input(s): "CHOL", "HDL", "LDLCALC", "TRIG", "CHOLHDL", "LDLDIRECT" in the last 72 hours. Thyroid Function Tests: No results for input(s): "TSH", "T4TOTAL", "FREET4", "T3FREE", "THYROIDAB" in  the last 72 hours. Anemia Panel: No results for input(s): "VITAMINB12", "FOLATE", "FERRITIN", "TIBC", "IRON", "RETICCTPCT" in the last 72 hours. Sepsis Labs: No results for input(s): "PROCALCITON", "LATICACIDVEN" in the last 168 hours.  Recent Results (from the past 240 hours)  MRSA Next Gen by PCR, Nasal     Status: None   Collection Time: 04/26/23  9:00 AM   Specimen: Nasal Mucosa; Nasal Swab  Result Value Ref Range Status   MRSA by PCR Next Gen NOT DETECTED NOT DETECTED Final    Comment: (NOTE) The GeneXpert MRSA Assay (FDA approved for NASAL specimens only), is one component of a comprehensive MRSA colonization surveillance program. It is not intended to diagnose MRSA infection nor to guide or monitor treatment for MRSA infections. Test performance is not FDA approved in patients less than 46 years old. Performed at [facility], [address], [place], [place] [address]   Resp panel  by RT-PCR (RSV, Flu A&B, Covid) Anterior Nasal Swab     Status: Abnormal   Collection Time: 04/30/23  4:27 PM   Specimen: Anterior Nasal Swab  Result Value Ref Range Status   SARS Coronavirus 2 by RT PCR NEGATIVE NEGATIVE Final    Comment: (NOTE) SARS-CoV-2 target nucleic acids are NOT DETECTED.  The SARS-CoV-2 RNA is generally detectable in upper respiratory specimens during the acute phase of infection. The lowest concentration of SARS-CoV-2 viral copies this assay can detect is 138 copies/mL. A negative result does not preclude SARS-Cov-2 infection and should not be used as the sole basis for treatment or other patient management decisions. A negative result may occur with  improper specimen collection/handling, submission of specimen other than nasopharyngeal swab, presence of viral mutation(s) within the areas targeted by this assay, and inadequate number of viral copies(<138 copies/mL). A negative result must be combined with clinical observations, patient history, and  epidemiological information. The expected result is Negative.  Fact Sheet for Patients:  BloggerCourse.com  Fact Sheet for Healthcare Providers:  SeriousBroker.it  This test is no t yet approved or cleared by the [place] FDA and  has been authorized for detection and/or diagnosis of SARS-CoV-2 by FDA under an Emergency Use Authorization (EUA). This EUA will remain  in effect (meaning this test can be used) for the duration of the COVID-19 declaration under Section 564(b)(1) of the Act, 21 U.S.C.section 360bbb-3(b)(1), unless the authorization is terminated  or revoked sooner.       Influenza A by PCR POSITIVE (A) NEGATIVE Final   Influenza B by PCR NEGATIVE NEGATIVE Final    Comment: (NOTE) The Xpert Xpress SARS-CoV-2/FLU/RSV plus assay is intended as an aid in the diagnosis of influenza from Nasopharyngeal swab specimens and should not be used as a sole basis for treatment. Nasal washings and aspirates are unacceptable for Xpert Xpress SARS-CoV-2/FLU/RSV testing.  Fact Sheet for Patients: BloggerCourse.com  Fact Sheet for Healthcare Providers: SeriousBroker.it  This test is not yet approved or cleared by the [place] FDA and has been authorized for detection and/or diagnosis of SARS-CoV-2 by FDA under an Emergency Use Authorization (EUA). This EUA will remain in effect (meaning this test can be used) for the duration of the COVID-19 declaration under Section 564(b)(1) of the Act, 21 U.S.C. section 360bbb-3(b)(1), unless the authorization is terminated or revoked.     Resp Syncytial Virus by PCR NEGATIVE NEGATIVE Final    Comment: (NOTE) Fact Sheet for Patients: BloggerCourse.com  Fact Sheet for Healthcare Providers: SeriousBroker.it  This test is not yet approved or cleared by the [place] FDA and has  been authorized for detection and/or diagnosis of SARS-CoV-2 by FDA under an Emergency Use Authorization (EUA). This EUA will remain in effect (meaning this test can be used) for the duration of the COVID-19 declaration under Section 564(b)(1) of the Act, 21 U.S.C. section 360bbb-3(b)(1), unless the authorization is terminated or revoked.  Performed at [facility], [address], [place], [place] [address]          Radiology Studies: CT CHEST W CONTRAST Result Date: 05/03/2023 CLINICAL DATA:  Follow-up pulmonary nodule. EXAM: CT CHEST WITH CONTRAST TECHNIQUE: Multidetector CT imaging of the chest was performed during intravenous contrast administration. RADIATION DOSE REDUCTION: This exam was performed according to the departmental dose-optimization program which includes automated exposure control, adjustment of the mA and/or kV according to patient size and/or use of iterative reconstruction technique. CONTRAST:  75mL OMNIPAQUE IOHEXOL 300 MG/ML  SOLN COMPARISON:  Chest radiograph dated 04/30/2023 and CT dated 03/10/2022. FINDINGS: Cardiovascular: There is no cardiomegaly or pericardial effusion. There is 3 vessel coronary vascular calcification. Mild atherosclerotic calcification of the thoracic aorta. No aneurysmal dilatation or dissection. The origins of the great vessels of the aortic arch appear patent. No pulmonary artery embolus identified. Mediastinum/Nodes: No hilar or mediastinal adenopathy. The esophagus is grossly unremarkable. A 1.3 cm left thyroid hypodense nodule. Recommend thyroid US (ref: J Am Coll Radiol. 2015 Feb;12(2): 143-50).No mediastinal fluid collection. Lungs/Pleura: Patchy areas of ground-glass densities bilaterally predominantly involving the upper lobes, new since the prior CT likely atypical pneumonia. Clinical correlation is recommended. There is background of emphysema. Evaluation of the lungs is limited due to respiratory motion. The previously seen  right lower lobe nodule is not identified with certainty on today's exam. There is no pleural effusion or pneumothorax. The central airways are patent. Upper Abdomen: No acute abnormality. Musculoskeletal: Osteopenia.  No acute osseous pathology. IMPRESSION: 1. Patchy areas of ground-glass densities bilaterally predominantly involving the upper lobes, new since the prior CT likely atypical pneumonia. 2. The previously seen right lower lobe nodule is not identified with certainty on today's exam. 3. Aortic Atherosclerosis (ICD10-I70.0) and Emphysema (ICD10-J43.9). Electronically Signed   By: Elgie Collard M.D.   On: 05/03/2023 13:32         Scheduled Meds:  aspirin  81 mg Oral Daily   atorvastatin  20 mg Oral Daily   benzonatate  200 mg Oral TID   DULoxetine  90 mg Oral Daily   enoxaparin (LOVENOX) injection  40 mg Subcutaneous Q24H   insulin aspart  0-5 Units Subcutaneous QHS   insulin aspart  0-6 Units Subcutaneous TID WC   ipratropium-albuterol  3 mL Nebulization Q6H   linaclotide  72 mcg Oral q morning   linagliptin  5 mg Oral Daily   loratadine  10 mg Oral Daily   metFORMIN  1,000 mg Oral BID WC   nicotine  14 mg Transdermal Q24H   oseltamivir  75 mg Oral BID   oxyCODONE  30 mg Oral BID   pantoprazole  40 mg Oral Daily   polyethylene glycol  17 g Oral BID   predniSONE  40 mg Oral Q breakfast   Continuous Infusions:   LOS: 3 days     Tresa Moore, MD [facility]   If 7PM-7AM, please contact night-coverage  05/04/2023, 12:12 PM

## 2023-05-04 NOTE — TOC Progression Note (Signed)
 Transition of Care [facility]) - Progression Note    Patient Details  Name: Laurie Berg MRN: [id number] Date of Birth: [birth year]  [facility] CM/SW Contact  Cherre Blanc, RN Phone Number: 05/04/2023, 10:03 AM  Clinical Narrative:     TOC spoke with patient's daughter [phone]. The patient has stationary O2 and a portable tank via [employer]. The portable tank is empty. TOC called [employer] to request portable O2. The tank will be delivered to the patient's room. TC will follow for any dc needs.        Expected Discharge Plan and Services        Home with O2                                       Social Determinants of Health (SDOH) Interventions SDOH Screenings   Food Insecurity: No Food Insecurity (05/01/2023)  Housing: Low Risk  (05/01/2023)  Transportation Needs: No Transportation Needs (05/01/2023)  Utilities: Not At Risk (05/01/2023)  Social Connections: Socially Isolated (05/01/2023)  Tobacco Use: High Risk (05/01/2023)    Readmission Risk Interventions     No data to display

## 2023-05-04 NOTE — Progress Notes (Signed)
 PULMONOLOGY         Date: 05/04/2023,   MRN# [id number] Laurie Berg [birth year]     AdmissionWeight: 75.8 kg                 CurrentWeight: 75.8 kg  Referring provider: Dr Renae Gloss   CHIEF COMPLAINT:   Acute on chronic hypoxemic respiratory failure   HISTORY OF PRESENT ILLNESS   This is a 63 yo F with history of lifelong smoking, COPD with chronic hypoxemia on home Trelegy once daily inhaler, previous bouts of COVID19, CAD, lumbago and cervical radiculopathy, DM2, lung nodules who came in with complaints of wheezing and dyspnea x 4 days with increased O2 requirement. She generally uses 2L/min [place] supplemental O2 at home but noted oxygen was dropping in the mid 70s.  She does continue to smoke and we discussed smoking cessation in the past. She was seen in ER with labored breathing, tachypnea, tachycardia and required 6L/min [place] to reach normoxia.  She had chest imaging performed noted to have bronchitic changes suggestive of COPD exacerbation but absence of florid pneumonia.  She does report onset of pedal edema recently.  She was placed on empiric antibiotics with vanco/cefepime/flagyl and started on LR for IVF. She had RVP which was negative.  She had CT head which was normal besides age related microvascular disease.   Patient was discharged 04/27/23 with acute COPD exacerbation.  She now has acute exacerbation again with Influenza A infection acutely.  PCCM consultation for additional evaluation and management.   05/04/23-  patient is close to baseline.  S/p CT chest with ground glass infiltrates from infulenza.  She has stopped smoking and has COPD meds and inhalers at home.  She is cleared for dc home from pulmonary.   PAST MEDICAL HISTORY   Past Medical History:  Diagnosis Date   Cervical radiculopathy    Chronic pain syndrome    Colon polyps    COPD (chronic obstructive pulmonary disease) (HCC)    Coronary artery calcification seen on CT scan    a. 04/2021 High  Res CT Chest: Ao atherosclerosis, cor Ca2+; b. 07/2021 MV: EF 69%, no isch/infarct, LAD/LCX Ca2+ noted; c. 02/2022 CT Chest: Cor and Ao atherosclerosis.   COVID-19 virus infection    Diabetes mellitus without complication (HCC)    History of echocardiogram    a. 08/2021 Echo: EF 55-60%, no rwma, nl RV fxn.   Lung nodule    a. 02/2022 CT Chest: RLL 7mm solid subpleural pulm nodule, prev 37mm-->rec thoracic surgery eval.   Sepsis (HCC)    Tobacco abuse      SURGICAL HISTORY   Past Surgical History:  Procedure Laterality Date   CARPAL TUNNEL RELEASE     COLONOSCOPY WITH PROPOFOL N/A 10/21/2019   Procedure: COLONOSCOPY WITH PROPOFOL;  Surgeon: Pasty Spillers, MD;  Location: [facility] ENDOSCOPY;  Service: Endoscopy;  Laterality: N/A;   DILATION AND CURETTAGE OF UTERUS     TUBAL LIGATION       FAMILY HISTORY   History reviewed. No pertinent family history.   SOCIAL HISTORY   Social History   Tobacco Use   Smoking status: Every Day    Current packs/day: 1.75    Average packs/day: 1.8 packs/day for 48.0 years (84.0 ttl pk-yrs)    Types: Cigarettes   Smokeless tobacco: Never  Vaping Use   Vaping status: Never Used  Substance Use Topics   Alcohol use: Not Currently   Drug use: Never  MEDICATIONS    Home Medication:    Current Medication:  Current Facility-Administered Medications:    acetaminophen (TYLENOL) tablet 650 mg, 650 mg, Oral, Q6H PRN, 650 mg at 05/03/23 0431 **OR** acetaminophen (TYLENOL) suppository 650 mg, 650 mg, Rectal, Q6H PRN, Sherryll Burger, Pratik D, DO   albuterol (PROVENTIL) (2.5 MG/3ML) 0.083% nebulizer solution 2.5 mg, 2.5 mg, Inhalation, Q4H PRN, Georgeann Oppenheim, Sudheer B, MD   aspirin chewable tablet 81 mg, 81 mg, Oral, Daily, Sherryll Burger, Pratik D, DO, 81 mg at 05/04/23 0813   atorvastatin (LIPITOR) tablet 20 mg, 20 mg, Oral, Daily, Sreenath, Sudheer B, MD, 20 mg at 05/04/23 0813   benzonatate (TESSALON) capsule 200 mg, 200 mg, Oral, TID, Georgeann Oppenheim, Sudheer B, MD, 200  mg at 05/04/23 1610   cyclobenzaprine (FLEXERIL) tablet 10 mg, 10 mg, Oral, BID PRN, Sherryll Burger, Pratik D, DO, 10 mg at 05/03/23 2258   DULoxetine (CYMBALTA) DR capsule 90 mg, 90 mg, Oral, Daily, Sherryll Burger, Pratik D, DO, 90 mg at 05/04/23 0813   enoxaparin (LOVENOX) injection 40 mg, 40 mg, Subcutaneous, Q24H, Shah, Pratik D, DO, 40 mg at 05/03/23 1942   insulin aspart (novoLOG) injection 0-5 Units, 0-5 Units, Subcutaneous, QHS, Shah, Pratik D, DO, 2 Units at 05/01/23 2120   insulin aspart (novoLOG) injection 0-6 Units, 0-6 Units, Subcutaneous, TID WC, Shah, Pratik D, DO, 1 Units at 05/03/23 1731   ipratropium-albuterol (DUONEB) 0.5-2.5 (3) MG/3ML nebulizer solution 3 mL, 3 mL, Nebulization, Q6H, Shah, Pratik D, DO, 3 mL at 05/04/23 0720   linaclotide (LINZESS) capsule 72 mcg, 72 mcg, Oral, q morning, Sherryll Burger, Pratik D, DO, 72 mcg at 05/03/23 0849   linagliptin (TRADJENTA) tablet 5 mg, 5 mg, Oral, Daily, Sherryll Burger, Pratik D, DO, 5 mg at 05/03/23 0849   loratadine (CLARITIN) tablet 10 mg, 10 mg, Oral, Daily, Sherryll Burger, Pratik D, DO, 10 mg at 05/04/23 0813   metFORMIN (GLUCOPHAGE-XR) 24 hr tablet 1,000 mg, 1,000 mg, Oral, BID WC, Shah, Pratik D, DO, 1,000 mg at 05/04/23 9604   nicotine (NICODERM CQ - dosed in mg/24 hours) patch 14 mg, 14 mg, Transdermal, Q24H, Vince Ainsley, MD, 14 mg at 05/03/23 1941   ondansetron (ZOFRAN) tablet 4 mg, 4 mg, Oral, Q6H PRN **OR** ondansetron (ZOFRAN) injection 4 mg, 4 mg, Intravenous, Q6H PRN, Sherryll Burger, Pratik D, DO   oseltamivir (TAMIFLU) capsule 75 mg, 75 mg, Oral, BID, Sherryll Burger, Pratik D, DO, 75 mg at 05/03/23 2138   oxyCODONE (OXYCONTIN) 12 hr tablet 30 mg, 30 mg, Oral, BID, Sherryll Burger, Pratik D, DO, 30 mg at 05/04/23 0813   oxyCODONE-acetaminophen (PERCOCET/ROXICET) 5-325 MG per tablet 1 tablet, 1 tablet, Oral, Q6H PRN, Sherryll Burger, Pratik D, DO, 1 tablet at 05/03/23 1941   pantoprazole (PROTONIX) EC tablet 40 mg, 40 mg, Oral, Daily, Sherryll Burger, Pratik D, DO, 40 mg at 05/04/23 0813   polyethylene glycol (MIRALAX  / GLYCOLAX) packet 17 g, 17 g, Oral, BID, Sherryll Burger, Pratik D, DO, 17 g at 05/03/23 0847   predniSONE (DELTASONE) tablet 40 mg, 40 mg, Oral, Q breakfast, Sreenath, Sudheer B, MD, 40 mg at 05/04/23 0813   senna-docusate (Senokot-S) tablet 2 tablet, 2 tablet, Oral, QHS PRN, Sherryll Burger, Pratik D, DO   traZODone (DESYREL) tablet 50 mg, 50 mg, Oral, QHS PRN, Georgeann Oppenheim, Sudheer B, MD    ALLERGIES   Pregabalin and Sulfamethoxazole-trimethoprim     REVIEW OF SYSTEMS    Review of Systems:  Gen:  Denies  fever, sweats, chills weigh loss  HEENT: Denies blurred vision, double vision, ear pain, eye  pain, hearing loss, nose bleeds, sore throat Cardiac:  No dizziness, chest pain or heaviness, chest tightness,edema Resp:   reports dyspnea chronically  Gi: Denies swallowing difficulty, stomach pain, nausea or vomiting, diarrhea, constipation, bowel incontinence Gu:  Denies bladder incontinence, burning urine Ext:   Denies Joint pain, stiffness or swelling Skin: Denies  skin rash, easy bruising or bleeding or hives Endoc:  Denies polyuria, polydipsia , polyphagia or weight change Psych:   Denies depression, insomnia or hallucinations   Other:  All other systems negative   VS: BP 116/72 (BP Location: Left Arm)   Pulse 97   Temp (!) 97.5 F (36.4 C) (Axillary)   Resp 18   Ht 5\' 4"  (1.626 m)   Wt 75.8 kg   SpO2 97%   BMI 28.68 kg/m      PHYSICAL EXAM    GENERAL:NAD, no fevers, chills, no weakness no fatigue HEAD: Normocephalic, atraumatic.  EYES: Pupils equal, round, reactive to light. Extraocular muscles intact. No scleral icterus.  MOUTH: Moist mucosal membrane. Dentition intact. No abscess noted.  EAR, NOSE, THROAT: Clear without exudates. No external lesions.  NECK: Supple. No thyromegaly. No nodules. No JVD.  PULMONARY: decreased breath sounds with mild rhonchi worse at bases bilaterally.  CARDIOVASCULAR: S1 and S2. Regular rate and rhythm. No murmurs, rubs, or gallops. No edema. Pedal  pulses 2+ bilaterally.  GASTROINTESTINAL: Soft, nontender, nondistended. No masses. Positive bowel sounds. No hepatosplenomegaly.  MUSCULOSKELETAL: No swelling, clubbing, or edema. Range of motion full in all extremities.  NEUROLOGIC: Cranial nerves II through XII are intact. No gross focal neurological deficits. Sensation intact. Reflexes intact.  SKIN: No ulceration, lesions, rashes, or cyanosis. Skin warm and dry. Turgor intact.  PSYCHIATRIC: Mood, affect within normal limits. The patient is awake, alert and oriented x 3. Insight, judgment intact.       IMAGING       ASSESSMENT/PLAN   Acute on chronic hypoxemic respiratory failure   - thus far infectious workup is + for influenza only, patient is on tamiflu   - doxycycline has been dcd   - CRP is normal    - continue steroids with tapering protocol  -currently on 30mg     - nebulizer therapy per COPD carepath    -reviewed CT chest with ground glass opacification consistent with influenza otherwise chronic emphysematous lungs without pulmonary edema, effusion, interstitial lung disease, fibrosis, tumors, masses and previously noted RLL 0.7cm lung subsolid nodule is barely visible.   - no need for pulmicort nebulizer therapy   Bibasilar atelectasis     - chest physiotherapy with incentive spirometry and flutter valve   - PT/OT when able    Tobacco dependence    - patient has not smoked in 2 weeks - commended her on this great achievement, reduced transdermal nicotine patch from 21 to 14mg    Allergic rhinosinusitis   - continue [place] nasal spary    Insomnia     Trazadone QHS      Thank you for allowing me to participate in the care of this patient.   Patient/Family are satisfied with care plan and all questions have been answered.    Provider disclosure: Patient with at least one acute or chronic illness or injury that poses a threat to life or bodily function and is being managed actively during this encounter.   All of the below services have been performed independently by signing provider:  review of prior documentation from internal and or external health records.  Review of  previous and current lab results.  Interview and comprehensive assessment during patient visit today. Review of current and previous chest radiographs/CT scans. Discussion of management and test interpretation with health care team and patient/family.   This document was prepared using Dragon voice recognition software and may include unintentional dictation errors.     Vida Rigger, M.D.  Division of Pulmonary & Critical Care Medicine

## 2023-05-05 DIAGNOSIS — J9621 Acute and chronic respiratory failure with hypoxia: Secondary | ICD-10-CM | POA: Diagnosis not present

## 2023-05-05 LAB — GLUCOSE, CAPILLARY: Glucose-Capillary: 110 mg/dL — ABNORMAL HIGH (ref 70–99)

## 2023-05-05 MED ORDER — NICOTINE 21 MG/24HR TD PT24: 21.0000 mg | MEDICATED_PATCH | TRANSDERMAL | 0 refills | Status: AC

## 2023-05-05 MED ORDER — PREDNISONE 10 MG PO TABS: ORAL_TABLET | ORAL | 0 refills | Status: AC

## 2023-05-05 MED ORDER — BENZONATATE 200 MG PO CAPS: 200.0000 mg | ORAL_CAPSULE | Freq: Three times a day (TID) | ORAL | 0 refills | Status: DC | PRN

## 2023-05-05 NOTE — Discharge Summary (Signed)
 Physician Discharge Summary  KARRI KALLENBACH ONG:[id number] DOB: April 04, 1960 DOA: 04/30/2023  PCP: Leanna Sato, MD  Admit date: 04/30/2023 Discharge date: 05/05/2023  Admitted From: Home Disposition:  Home  Recommendations for Outpatient Follow-up:  Follow up with PCP in 1-2 weeks Follow-up with pulmonary as directed  Home Health: No Equipment/Devices: Oxygen 2 L via nasal cannula  Discharge Condition: Stable CODE STATUS: Full Diet recommendation: Regular  Brief/Interim Summary:  63 y.o. female with medical history significant for CAD, type 2 diabetes, and COPD with chronic hypoxemia recently discharged after hospitalization for acute on chronic hypoxemic respiratory failure with acute decompensated COPD on 3/14.  At that time, she was treated for her COPD exacerbation and was thought to have a possible viral syndrome, however COVID, flu, RSV and RVP testing were all negative.  Since discharge, she has had worsening shortness of breath and cough and is now noted to be influenza A positive.  She denies any current fevers or chills or chest pain.  She is noted to have a dry cough with no sputum production.  She states that she has stopped smoking since her recent admission.   ED Course: Vital signs stable and patient on 4 L nasal cannula without any acute respiratory distress or tachypnea noted.  Positive for influenza A.  Chest x-ray with emphysema no other acute findings.  Leukocytosis of 13,200 noted and glucose 66.  Daughter at bedside.   3/18.  Patient did not desaturate with being on 4 L with physical therapy today down to 88%.  Patient does not feel back to her baseline yet.   3/20: Persistent cough and SOB. 3/21: Improved 3/22: Discharged home    Discharge Diagnoses:  Principal Problem:   Acute on chronic respiratory failure with hypoxia (HCC) Active Problems:   Influenza A   COPD with acute exacerbation (HCC)   Tobacco use disorder   Coronary artery disease   GERD  without esophagitis   Type 2 diabetes mellitus without complications (HCC)   Overweight (BMI 25.0-29.9)   Acute respiratory failure with hypoxia (HCC)  * Acute on chronic respiratory failure with hypoxia (HCC) Likely result of dependent COPD and continued smoking in the setting of recent influenza infection Plan: P.o. nasal taper prescribed on discharge.  Resume home bronchodilator regimen.  Educate on smoking cessation.  Nicotine patches prescribed.  No antibiotics indicated on discharge.  Doxycycline discontinued.  Follow-up outpatient PCP and pulmonary.    Influenza A Continue 5-day course of oseltamivir, completed in house.  Will DC on discharge     Discharge Instructions  Discharge Instructions     Diet - low sodium heart healthy   Complete by: As directed    Increase activity slowly   Complete by: As directed       Allergies as of 05/05/2023       Reactions   Pregabalin Shortness Of Breath, Swelling   "Felt like I couldn't breathe and my tongue was swelling up"   Sulfamethoxazole-trimethoprim Itching        Medication List     STOP taking these medications    doxycycline 100 MG tablet Commonly known as: VIBRA-TABS       TAKE these medications    albuterol 108 (90 Base) MCG/ACT inhaler Commonly known as: VENTOLIN HFA Inhale 1 puff into the lungs as needed.   aspirin 81 MG chewable tablet Chew 1 tablet (81 mg total) by mouth daily.   atorvastatin 20 MG tablet Commonly known as: LIPITOR Take 1 tablet (  20 mg total) by mouth daily.   BC HEADACHE POWDER PO Take 1 packet by mouth as needed (pain).   benzonatate 200 MG capsule Commonly known as: TESSALON Take 1 capsule (200 mg total) by mouth 3 (three) times daily as needed for cough.   Blood Glucose Monitoring Suppl Devi 1 each by Does not apply route in the morning, at noon, and at bedtime. May substitute to any manufacturer covered by patient's insurance.   cetirizine 10 MG tablet Commonly known  as: ZYRTEC Take 10 mg by mouth daily.   cyclobenzaprine 10 MG tablet Commonly known as: FLEXERIL Take 10 mg by mouth 2 (two) times daily as needed for muscle spasms.   DULoxetine 30 MG capsule Commonly known as: CYMBALTA Take 90 mg by mouth daily.   guaiFENesin 600 MG 12 hr tablet Commonly known as: MUCINEX Take 1 tablet (600 mg total) by mouth 2 (two) times daily for 21 days.   ipratropium-albuterol 0.5-2.5 (3) MG/3ML Soln Commonly known as: DUONEB Take 3 mLs by nebulization every 6 (six) hours as needed.   Linzess 72 MCG capsule Generic drug: linaclotide Take 72 mcg by mouth every morning.   metFORMIN 500 MG 24 hr tablet Commonly known as: GLUCOPHAGE-XR Take 2 tablets (1,000 mg total) by mouth 2 (two) times daily with a meal.   multivitamin with minerals Tabs tablet Take 1 tablet by mouth daily.   naloxone 4 MG/0.1ML Liqd nasal spray kit Commonly known as: NARCAN One spray in either nostril once for known/suspected opioid overdose. May repeat every 2-3 minutes in alternating nostril til EMS arrives   nicotine 21 mg/24hr patch Commonly known as: NICODERM CQ - dosed in mg/24 hours Place 1 patch (21 mg total) onto the skin daily for 28 days.   omeprazole 40 MG capsule Commonly known as: PRILOSEC Take 40 mg by mouth daily.   oxyCODONE-acetaminophen 5-325 MG tablet Commonly known as: PERCOCET/ROXICET Take 1 tablet by mouth every 6 (six) hours as needed.   OxyCONTIN 30 MG 12 hr tablet Generic drug: oxyCODONE Take 1 tablet by mouth 2 (two) times daily.   polyethylene glycol 17 g packet Commonly known as: MIRALAX / GLYCOLAX Take 17 g by mouth 2 (two) times daily.   predniSONE 10 MG tablet Commonly known as: DELTASONE Take 3 tablets (30 mg total) by mouth daily with breakfast for 2 days, THEN 2 tablets (20 mg total) daily with breakfast for 3 days, THEN 1 tablet (10 mg total) daily with breakfast for 3 days. Start taking on: May 05, 2023 What changed: See the new  instructions.   senna-docusate 8.6-50 MG tablet Commonly known as: Senokot-S Take 2 tablets by mouth at bedtime as needed for mild constipation.   Tradjenta 5 MG Tabs tablet Generic drug: linagliptin Take 5 mg by mouth daily.   Trelegy Ellipta 100-62.5-25 MCG/ACT Aepb Generic drug: Fluticasone-Umeclidin-Vilant Take 1 puff by mouth daily.        Follow-up Information     Leanna Sato, MD Follow up.   Specialty: Family Medicine Why: Hospital follow up Contact information: [address] [place] [address] [phone]                Allergies  Allergen Reactions   Pregabalin Shortness Of Breath and Swelling    "Felt like I couldn't breathe and my tongue was swelling up"   Sulfamethoxazole-Trimethoprim Itching    Consultations: Pulmonary   Procedures/Studies: CT CHEST W CONTRAST Result Date: 05/03/2023 CLINICAL DATA:  Follow-up pulmonary nodule.  EXAM: CT CHEST WITH CONTRAST TECHNIQUE: Multidetector CT imaging of the chest was performed during intravenous contrast administration. RADIATION DOSE REDUCTION: This exam was performed according to the departmental dose-optimization program which includes automated exposure control, adjustment of the mA and/or kV according to patient size and/or use of iterative reconstruction technique. CONTRAST:  75mL OMNIPAQUE IOHEXOL 300 MG/ML  SOLN COMPARISON:  Chest radiograph dated 04/30/2023 and CT dated 03/10/2022. FINDINGS: Cardiovascular: There is no cardiomegaly or pericardial effusion. There is 3 vessel coronary vascular calcification. Mild atherosclerotic calcification of the thoracic aorta. No aneurysmal dilatation or dissection. The origins of the great vessels of the aortic arch appear patent. No pulmonary artery embolus identified. Mediastinum/Nodes: No hilar or mediastinal adenopathy. The esophagus is grossly unremarkable. A 1.3 cm left thyroid hypodense nodule. Recommend thyroid US (ref: J Am Coll Radiol. 2015  Feb;12(2): 143-50).No mediastinal fluid collection. Lungs/Pleura: Patchy areas of ground-glass densities bilaterally predominantly involving the upper lobes, new since the prior CT likely atypical pneumonia. Clinical correlation is recommended. There is background of emphysema. Evaluation of the lungs is limited due to respiratory motion. The previously seen right lower lobe nodule is not identified with certainty on today's exam. There is no pleural effusion or pneumothorax. The central airways are patent. Upper Abdomen: No acute abnormality. Musculoskeletal: Osteopenia.  No acute osseous pathology. IMPRESSION: 1. Patchy areas of ground-glass densities bilaterally predominantly involving the upper lobes, new since the prior CT likely atypical pneumonia. 2. The previously seen right lower lobe nodule is not identified with certainty on today's exam. 3. Aortic Atherosclerosis (ICD10-I70.0) and Emphysema (ICD10-J43.9). Electronically Signed   By: Elgie Collard M.D.   On: 05/03/2023 13:32   DG Chest 2 View Result Date: 04/30/2023 CLINICAL DATA:  Short of breath, COPD EXAM: CHEST - 2 VIEW COMPARISON:  04/27/2023 FINDINGS: Frontal and lateral views of the chest demonstrate a stable cardiac silhouette. Stable hyperinflation and background interstitial scarring consistent with emphysema. The patchy consolidation seen on prior study has near completely resolved in the interim, with no airspace disease, effusion, or pneumothorax identified on today's exam. No acute bony abnormalities. IMPRESSION: 1. Emphysema, with interval resolution of the patchy upper lobe predominant airspace disease seen previously. Electronically Signed   By: Sharlet Salina M.D.   On: 04/30/2023 16:22   DG Chest Port 1 View Result Date: 04/27/2023 CLINICAL DATA:  Bilateral pulmonary infiltrates. EXAM: PORTABLE CHEST 1 VIEW COMPARISON:  04/23/2023 FINDINGS: Increased patchy densities in the bilateral upper lobes. Probable atelectasis at the  left lung base. Heart size is within normal limits and stable. Atherosclerotic calcifications at the aortic arch. Negative for a pneumothorax. IMPRESSION: Subtle patchy densities in the bilateral upper lobes. Findings concerning for an infectious etiology. Electronically Signed   By: Richarda Overlie M.D.   On: 04/27/2023 12:14   CT HEAD WO CONTRAST ( ) Result Date: 04/24/2023 CLINICAL DATA:  Mental status change. EXAM: CT HEAD WITHOUT CONTRAST TECHNIQUE: Contiguous axial images were obtained from the base of the skull through the vertex without intravenous contrast. RADIATION DOSE REDUCTION: This exam was performed according to the departmental dose-optimization program which includes automated exposure control, adjustment of the mA and/or kV according to patient size and/or use of iterative reconstruction technique. COMPARISON:  None Available. FINDINGS: Brain: No acute intracranial hemorrhage. No CT evidence of acute infarct. Nonspecific hypoattenuation in the periventricular and subcortical white matter favored to reflect chronic microvascular ischemic changes. No edema, mass effect, or midline shift. The basilar cisterns are patent. Ventricles: The ventricles are normal.  Vascular: Atherosclerotic calcifications of the carotid siphons and intracranial vertebral arteries. No hyperdense vessel. Skull: No acute or aggressive finding. Orbits: Orbits are symmetric. Sinuses: The visualized paranasal sinuses are clear. Other: Mastoid air cells are clear. IMPRESSION: 1. No acute intracranial abnormality. 2. Moderate chronic microvascular ischemic changes. Electronically Signed   By: Emily Filbert M.D.   On: 04/24/2023 10:38   DG Chest Port 1 View Result Date: 04/23/2023 CLINICAL DATA:  Possible sepsis EXAM: PORTABLE CHEST 1 VIEW COMPARISON:  03/21/2022 FINDINGS: Emphysema and bronchitic changes. No consolidation, pleural effusion or pneumothorax. Normal cardiac size. Aortic atherosclerosis. IMPRESSION: No active  disease. Emphysema and bronchitic changes. Electronically Signed   By: Jasmine Pang M.D.   On: 04/23/2023 23:24      Subjective: Seen and examined on the day of discharge.  Stable no distress.  Appropriate discharge home.  Discharge Exam: Vitals:   05/05/23 0557 05/05/23 0705  BP: 124/77   Pulse: (!) 59   Resp: 18   Temp: 99 F (37.2 C)   SpO2: 100% 93%   Vitals:   05/04/23 1800 05/04/23 2101 05/05/23 0557 05/05/23 0705  BP:  116/72 124/77   Pulse:  97 (!) 59   Resp:  16 18   Temp:  97.6 F (36.4 C) 99 F (37.2 C)   TempSrc:   Oral   SpO2: 91% 91% 100% 93%  Weight:      Height:        General: Pt is alert, awake, not in acute distress Cardiovascular: RRR, S1/S2 +, no rubs, no gallops Respiratory: CTA bilaterally, no wheezing, no rhonchi Abdominal: Soft, NT, ND, bowel sounds + Extremities: no edema, no cyanosis    The results of significant diagnostics from this hospitalization (including imaging, microbiology, ancillary and laboratory) are listed below for reference.     Microbiology: Recent Results (from the past 240 hours)  MRSA Next Gen by PCR, Nasal     Status: None   Collection Time: 04/26/23  9:00 AM   Specimen: Nasal Mucosa; Nasal Swab  Result Value Ref Range Status   MRSA by PCR Next Gen NOT DETECTED NOT DETECTED Final    Comment: (NOTE) The GeneXpert MRSA Assay (FDA approved for NASAL specimens only), is one component of a comprehensive MRSA colonization surveillance program. It is not intended to diagnose MRSA infection nor to guide or monitor treatment for MRSA infections. Test performance is not FDA approved in patients less than 10 years old. Performed at [facility], [address] [place], [place] [address]   Resp panel by RT-PCR (RSV, Flu A&B, Covid) Anterior Nasal Swab     Status: Abnormal   Collection Time: 04/30/23  4:27 PM   Specimen: Anterior Nasal Swab  Result Value Ref Range Status   SARS Coronavirus 2 by RT PCR  NEGATIVE NEGATIVE Final    Comment: (NOTE) SARS-CoV-2 target nucleic acids are NOT DETECTED.  The SARS-CoV-2 RNA is generally detectable in upper respiratory specimens during the acute phase of infection. The lowest concentration of SARS-CoV-2 viral copies this assay can detect is 138 copies/mL. A negative result does not preclude SARS-Cov-2 infection and should not be used as the sole basis for treatment or other patient management decisions. A negative result may occur with  improper specimen collection/handling, submission of specimen other than nasopharyngeal swab, presence of viral mutation(s) within the areas targeted by this assay, and inadequate number of viral copies(<138 copies/mL). A negative result must be combined with clinical observations, patient history, and  epidemiological information. The expected result is Negative.  Fact Sheet for Patients:  BloggerCourse.com  Fact Sheet for Healthcare Providers:  SeriousBroker.it  This test is no t yet approved or cleared by the [place] FDA and  has been authorized for detection and/or diagnosis of SARS-CoV-2 by FDA under an Emergency Use Authorization (EUA). This EUA will remain  in effect (meaning this test can be used) for the duration of the COVID-19 declaration under Section 564(b)(1) of the Act, 21 U.S.C.section 360bbb-3(b)(1), unless the authorization is terminated  or revoked sooner.       Influenza A by PCR POSITIVE (A) NEGATIVE Final   Influenza B by PCR NEGATIVE NEGATIVE Final    Comment: (NOTE) The Xpert Xpress SARS-CoV-2/FLU/RSV plus assay is intended as an aid in the diagnosis of influenza from Nasopharyngeal swab specimens and should not be used as a sole basis for treatment. Nasal washings and aspirates are unacceptable for Xpert Xpress SARS-CoV-2/FLU/RSV testing.  Fact Sheet for Patients: BloggerCourse.com  Fact Sheet for  Healthcare Providers: SeriousBroker.it  This test is not yet approved or cleared by the [place] FDA and has been authorized for detection and/or diagnosis of SARS-CoV-2 by FDA under an Emergency Use Authorization (EUA). This EUA will remain in effect (meaning this test can be used) for the duration of the COVID-19 declaration under Section 564(b)(1) of the Act, 21 U.S.C. section 360bbb-3(b)(1), unless the authorization is terminated or revoked.     Resp Syncytial Virus by PCR NEGATIVE NEGATIVE Final    Comment: (NOTE) Fact Sheet for Patients: BloggerCourse.com  Fact Sheet for Healthcare Providers: SeriousBroker.it  This test is not yet approved or cleared by the [place] FDA and has been authorized for detection and/or diagnosis of SARS-CoV-2 by FDA under an Emergency Use Authorization (EUA). This EUA will remain in effect (meaning this test can be used) for the duration of the COVID-19 declaration under Section 564(b)(1) of the Act, 21 U.S.C. section 360bbb-3(b)(1), unless the authorization is terminated or revoked.  Performed at [facility], [address] [place], [place] [address]      Labs: BNP (last 3 results) Recent Labs    04/30/23 1438  BNP 16.0   Basic Metabolic Panel: Recent Labs  Lab 04/30/23 1438 04/30/23 2040 05/01/23 0430 05/04/23 0816  NA 138  --  134* 138  K 3.5  --  4.0 4.1  CL 98  --  97* 100  CO2 29  --  26 26  GLUCOSE 66*  --  232* 117*  BUN 22  --  18 21  CREATININE 0.70 0.88 0.79 0.86  CALCIUM 9.0  --  9.3 8.8*  MG  --   --  1.9  --    Liver Function Tests: No results for input(s): "AST", "ALT", "ALKPHOS", "BILITOT", "PROT", "ALBUMIN" in the last 168 hours. No results for input(s): "LIPASE", "AMYLASE" in the last 168 hours. No results for input(s): "AMMONIA" in the last 168 hours. CBC: Recent Labs  Lab 04/30/23 1438 04/30/23 2040  05/01/23 0430 05/04/23 0816  WBC 13.2* 10.4 9.9 16.6*  NEUTROABS 8.9*  --   --  11.3*  HGB 13.9 13.3 13.2 13.5  HCT 42.9 39.7 40.1 41.0  MCV 88.3 88.4 88.9 88.9  PLT 374 332 356 419*   Cardiac Enzymes: No results for input(s): "CKTOTAL", "CKMB", "CKMBINDEX", "TROPONINI" in the last 168 hours. BNP: Invalid input(s): "POCBNP" CBG: Recent Labs  Lab 05/04/23 1140 05/04/23 1348 05/04/23 1659 05/04/23 2102 05/05/23 0731  GLUCAP  161* 197* 174* 209* 110*   D-Dimer No results for input(s): "DDIMER" in the last 72 hours. Hgb A1c No results for input(s): "HGBA1C" in the last 72 hours. Lipid Profile No results for input(s): "CHOL", "HDL", "LDLCALC", "TRIG", "CHOLHDL", "LDLDIRECT" in the last 72 hours. Thyroid function studies No results for input(s): "TSH", "T4TOTAL", "T3FREE", "THYROIDAB" in the last 72 hours.  Invalid input(s): "FREET3" Anemia work up No results for input(s): "VITAMINB12", "FOLATE", "FERRITIN", "TIBC", "IRON", "RETICCTPCT" in the last 72 hours. Urinalysis    Component Value Date/Time   COLORURINE YELLOW (A) 04/23/2023 2228   APPEARANCEUR CLEAR (A) 04/23/2023 2228   LABSPEC 1.016 04/23/2023 2228   PHURINE 5.0 04/23/2023 2228   GLUCOSEU NEGATIVE 04/23/2023 2228   HGBUR MODERATE (A) 04/23/2023 2228   BILIRUBINUR NEGATIVE 04/23/2023 2228   KETONESUR NEGATIVE 04/23/2023 2228   PROTEINUR 100 (A) 04/23/2023 2228   NITRITE NEGATIVE 04/23/2023 2228   LEUKOCYTESUR NEGATIVE 04/23/2023 2228   Sepsis Labs Recent Labs  Lab 04/30/23 1438 04/30/23 2040 05/01/23 0430 05/04/23 0816  WBC 13.2* 10.4 9.9 16.6*   Microbiology Recent Results (from the past 240 hours)  MRSA Next Gen by PCR, Nasal     Status: None   Collection Time: 04/26/23  9:00 AM   Specimen: Nasal Mucosa; Nasal Swab  Result Value Ref Range Status   MRSA by PCR Next Gen NOT DETECTED NOT DETECTED Final    Comment: (NOTE) The GeneXpert MRSA Assay (FDA approved for NASAL specimens only), is one  component of a comprehensive MRSA colonization surveillance program. It is not intended to diagnose MRSA infection nor to guide or monitor treatment for MRSA infections. Test performance is not FDA approved in patients less than 63 years old. Performed at [facility], [address] [place], [place] [address]   Resp panel by RT-PCR (RSV, Flu A&B, Covid) Anterior Nasal Swab     Status: Abnormal   Collection Time: 04/30/23  4:27 PM   Specimen: Anterior Nasal Swab  Result Value Ref Range Status   SARS Coronavirus 2 by RT PCR NEGATIVE NEGATIVE Final    Comment: (NOTE) SARS-CoV-2 target nucleic acids are NOT DETECTED.  The SARS-CoV-2 RNA is generally detectable in upper respiratory specimens during the acute phase of infection. The lowest concentration of SARS-CoV-2 viral copies this assay can detect is 138 copies/mL. A negative result does not preclude SARS-Cov-2 infection and should not be used as the sole basis for treatment or other patient management decisions. A negative result may occur with  improper specimen collection/handling, submission of specimen other than nasopharyngeal swab, presence of viral mutation(s) within the areas targeted by this assay, and inadequate number of viral copies(<138 copies/mL). A negative result must be combined with clinical observations, patient history, and epidemiological information. The expected result is Negative.  Fact Sheet for Patients:  BloggerCourse.com  Fact Sheet for Healthcare Providers:  SeriousBroker.it  This test is no t yet approved or cleared by the [place] FDA and  has been authorized for detection and/or diagnosis of SARS-CoV-2 by FDA under an Emergency Use Authorization (EUA). This EUA will remain  in effect (meaning this test can be used) for the duration of the COVID-19 declaration under Section 564(b)(1) of the Act, 21 U.S.C.section 360bbb-3(b)(1),  unless the authorization is terminated  or revoked sooner.       Influenza A by PCR POSITIVE (A) NEGATIVE Final   Influenza B by PCR NEGATIVE NEGATIVE Final    Comment: (NOTE) The Xpert Xpress SARS-CoV-2/FLU/RSV plus  assay is intended as an aid in the diagnosis of influenza from Nasopharyngeal swab specimens and should not be used as a sole basis for treatment. Nasal washings and aspirates are unacceptable for Xpert Xpress SARS-CoV-2/FLU/RSV testing.  Fact Sheet for Patients: BloggerCourse.com  Fact Sheet for Healthcare Providers: SeriousBroker.it  This test is not yet approved or cleared by the [place] FDA and has been authorized for detection and/or diagnosis of SARS-CoV-2 by FDA under an Emergency Use Authorization (EUA). This EUA will remain in effect (meaning this test can be used) for the duration of the COVID-19 declaration under Section 564(b)(1) of the Act, 21 U.S.C. section 360bbb-3(b)(1), unless the authorization is terminated or revoked.     Resp Syncytial Virus by PCR NEGATIVE NEGATIVE Final    Comment: (NOTE) Fact Sheet for Patients: BloggerCourse.com  Fact Sheet for Healthcare Providers: SeriousBroker.it  This test is not yet approved or cleared by the [place] FDA and has been authorized for detection and/or diagnosis of SARS-CoV-2 by FDA under an Emergency Use Authorization (EUA). This EUA will remain in effect (meaning this test can be used) for the duration of the COVID-19 declaration under Section 564(b)(1) of the Act, 21 U.S.C. section 360bbb-3(b)(1), unless the authorization is terminated or revoked.  Performed at [facility], [address] [place], [place] [address]      Time coordinating discharge: Over 30 minutes  SIGNED:   Tresa Moore, MD  [facility] 05/05/2023, 11:12 AM Pager   If 7PM-7AM, please  contact night-coverage

## 2023-08-07 ENCOUNTER — Other Ambulatory Visit: Payer: Self-pay | Admitting: Pulmonary Disease

## 2023-08-07 DIAGNOSIS — R918 Other nonspecific abnormal finding of lung field: Secondary | ICD-10-CM

## 2023-08-07 DIAGNOSIS — F17200 Nicotine dependence, unspecified, uncomplicated: Secondary | ICD-10-CM

## 2023-08-21 ENCOUNTER — Ambulatory Visit: Attending: Pulmonary Disease

## 2023-11-20 IMAGING — CT CT CHEST LUNG CANCER SCREENING LOW DOSE W/O CM
1 series · 15 of 33 positions shown, 19 images · non-contrast
Comparison: None.

CLINICAL DATA: 61-year-old female with 60 pack-year history of
smoking. Lung cancer screening.



[Series 3: axial st · axial · 0.66mm/px · z∈[-556,-306]mm · 15 of 60 slices shown, 19 images]
[im 5/60  mediastinal]
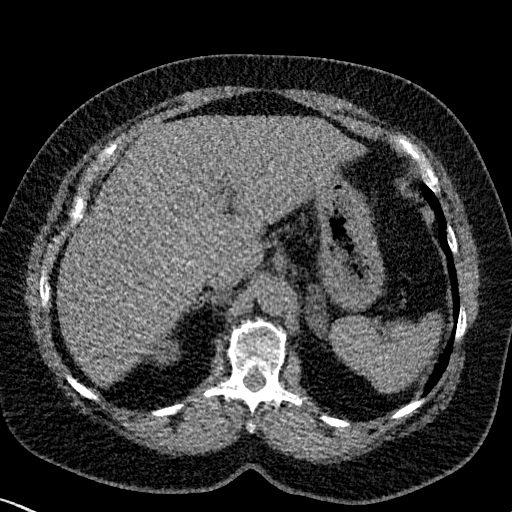
[im 5/60  lung]
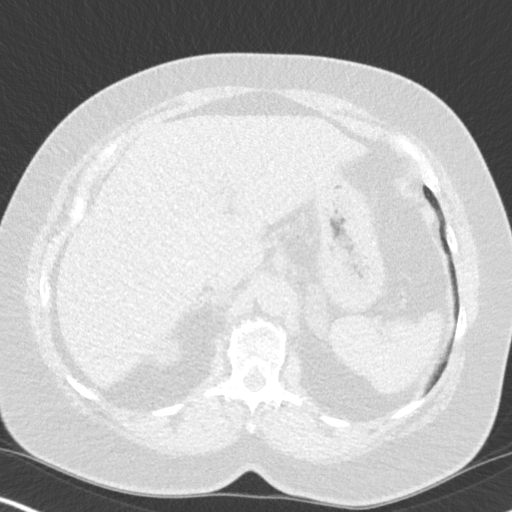
[im 9/60  lung]
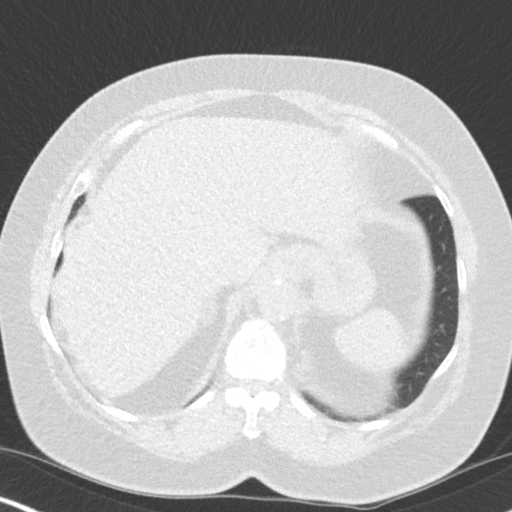
[im 12/60  lung]
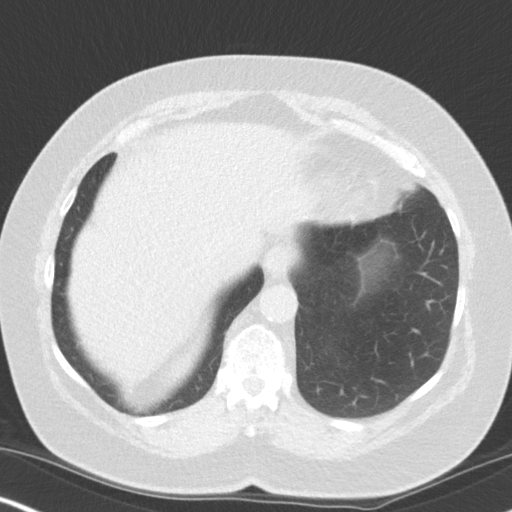
[im 16/60  lung]
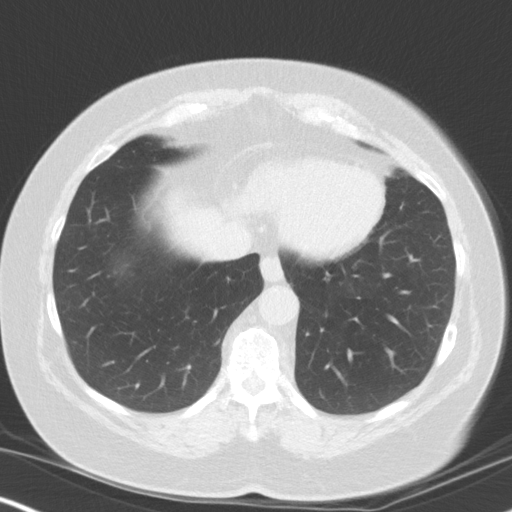
[im 20/60  mediastinal]
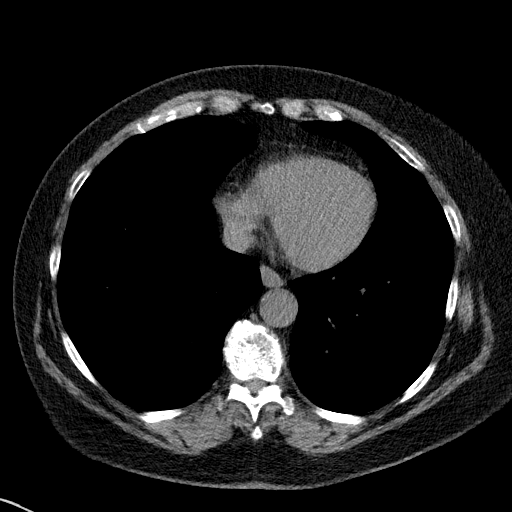
[im 20/60  lung]
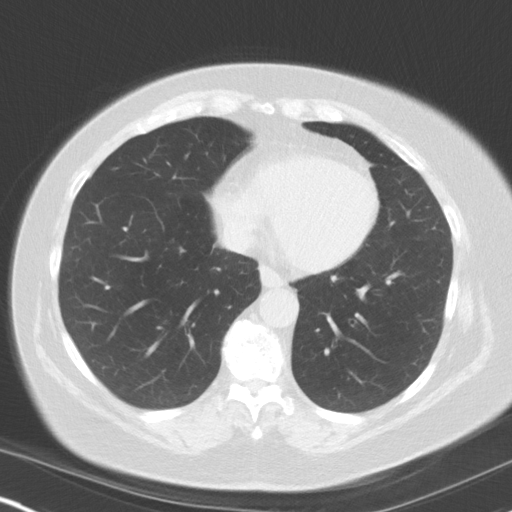
[im 24/60  lung]
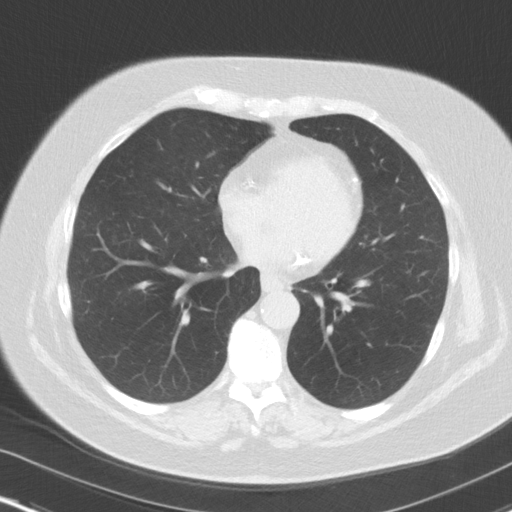
[im 27/60  lung]
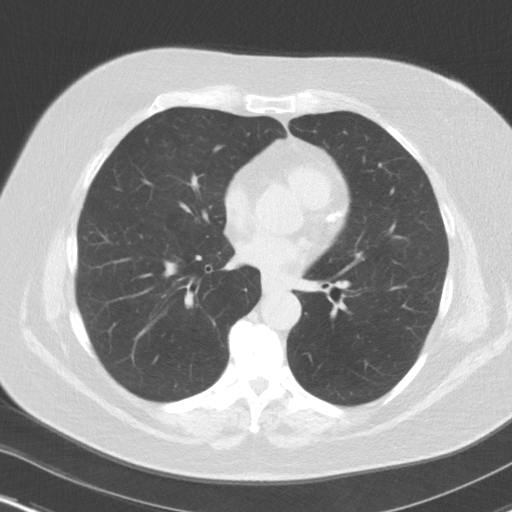
[im 31/60  lung]
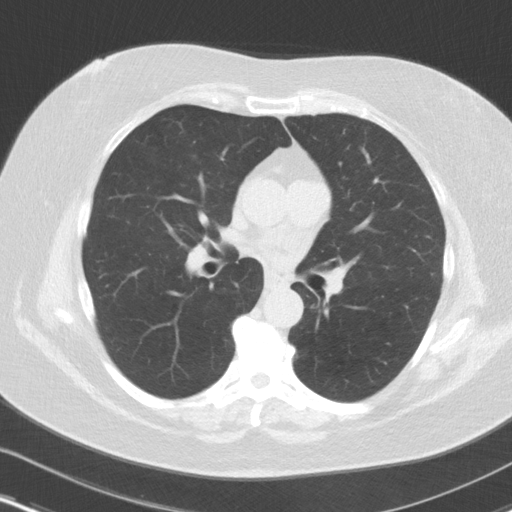
[im 33/60  mediastinal]
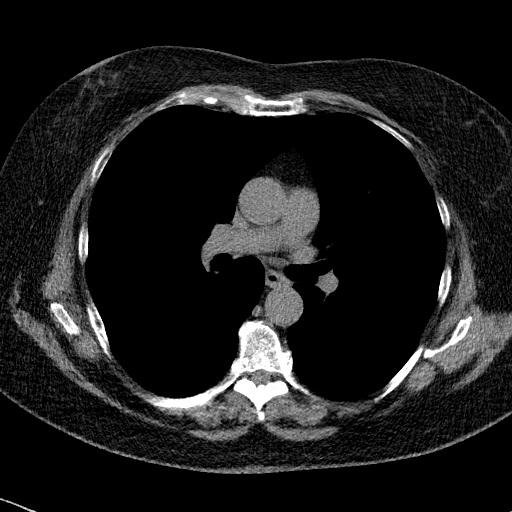
[im 33/60  lung]
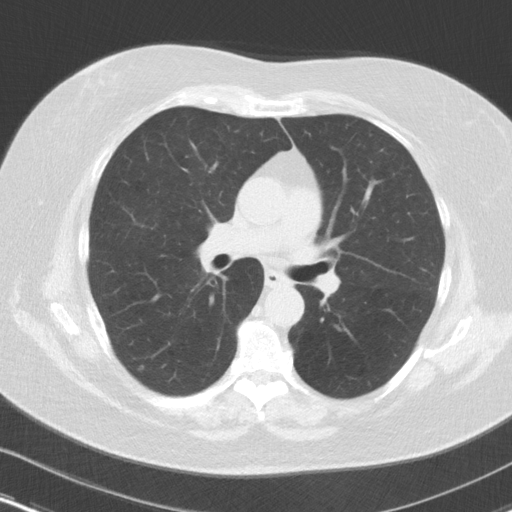
[im 36/60  lung]
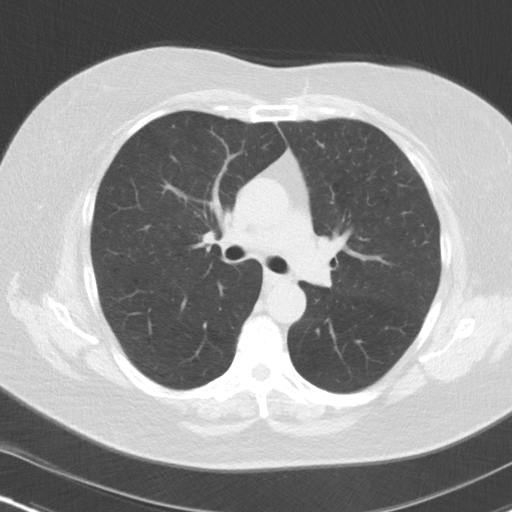
[im 40/60  lung]
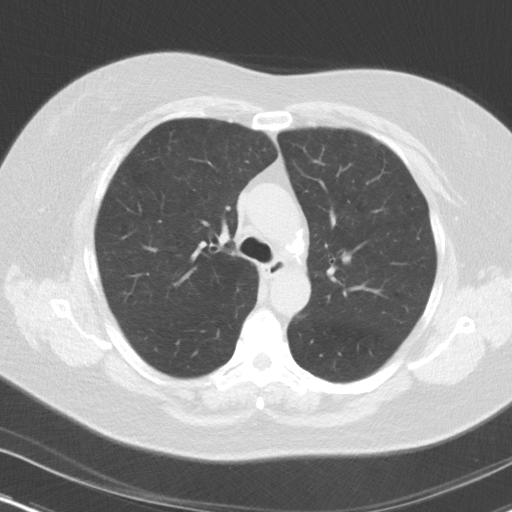
[im 44/60  lung]
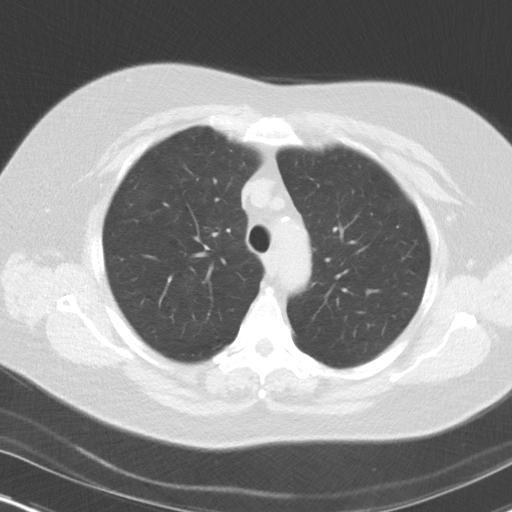
[im 48/60  mediastinal]
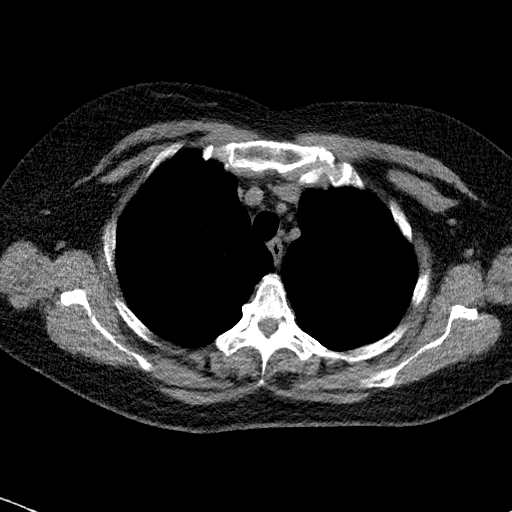
[im 48/60  lung]
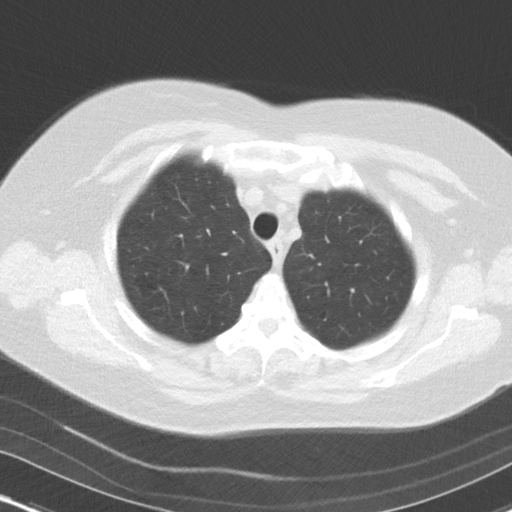
[im 51/60  lung]
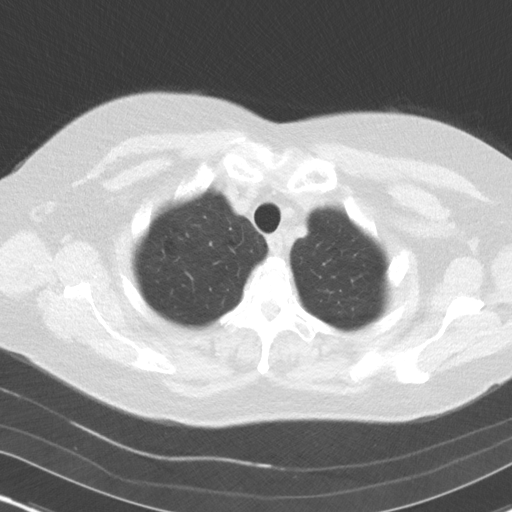
[im 55/60  lung]
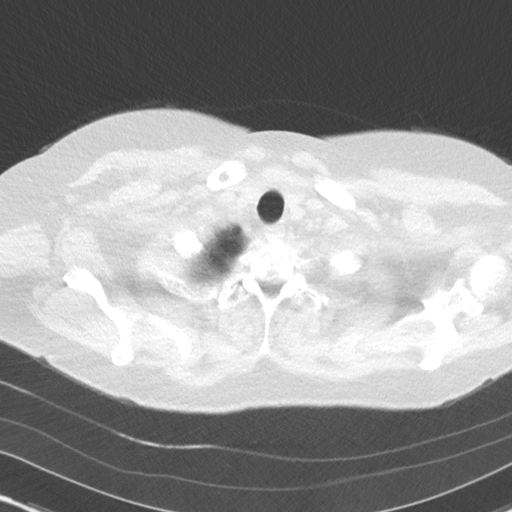

[15 of 33 positions shown; findings below may reference images not displayed]

FINDINGS: Cardiovascular: The heart size is normal. No substantial pericardial
effusion. Coronary artery calcification is evident. Mild
atherosclerotic calcification is noted in the wall of the thoracic
aorta.

Mediastinum/Nodes: No mediastinal lymphadenopathy. No evidence for
gross hilar lymphadenopathy although assessment is limited by the
lack of intravenous contrast on the current study. The esophagus has
normal imaging features. There is no axillary lymphadenopathy.

Lungs/Pleura: Centrilobular emphsyema noted. 3.9 mm right lower lobe
pulmonary nodule identified. No suspicious pulmonary nodule or mass.
Insert no consolidation No pleural effusion.

Upper Abdomen: 18 mm right adrenal adenoma. Patient also has an 18
mm adenoma in the left adrenal gland.

Musculoskeletal: No worrisome lytic or sclerotic osseous
abnormality.
IMPRESSION: 1. Lung-RADS 2, benign appearance or behavior. Continue annual
screening with low-dose chest CT without contrast in 12 months.
2. Bilateral adrenal adenomas.
3. Aortic Atherosclerosis (3F85D-7NN.N) and Emphysema (3F85D-7A9.6).

## 2024-03-13 ENCOUNTER — Observation Stay

## 2024-03-13 ENCOUNTER — Emergency Department

## 2024-03-13 ENCOUNTER — Observation Stay (HOSPITAL_BASED_OUTPATIENT_CLINIC_OR_DEPARTMENT_OTHER)
Admit: 2024-03-13 | Discharge: 2024-03-13 | Disposition: A | Discharge status: Home / Self Care | Attending: Internal Medicine | Admitting: Internal Medicine

## 2024-03-13 ENCOUNTER — Observation Stay
Admission: EM | Admit: 2024-03-13 | Discharge: 2024-03-14 | Disposition: A | Discharge status: Home / Self Care | Attending: Internal Medicine | Admitting: Internal Medicine

## 2024-03-13 ENCOUNTER — Other Ambulatory Visit: Payer: Self-pay

## 2024-03-13 DIAGNOSIS — G459 Transient cerebral ischemic attack, unspecified: Principal | ICD-10-CM | POA: Diagnosis present

## 2024-03-13 DIAGNOSIS — K219 Gastro-esophageal reflux disease without esophagitis: Secondary | ICD-10-CM | POA: Diagnosis present

## 2024-03-13 DIAGNOSIS — F17210 Nicotine dependence, cigarettes, uncomplicated: Secondary | ICD-10-CM | POA: Insufficient documentation

## 2024-03-13 DIAGNOSIS — Z79899 Other long term (current) drug therapy: Secondary | ICD-10-CM | POA: Insufficient documentation

## 2024-03-13 DIAGNOSIS — I2510 Atherosclerotic heart disease of native coronary artery without angina pectoris: Secondary | ICD-10-CM | POA: Insufficient documentation

## 2024-03-13 DIAGNOSIS — Z9981 Dependence on supplemental oxygen: Secondary | ICD-10-CM | POA: Insufficient documentation

## 2024-03-13 DIAGNOSIS — J449 Chronic obstructive pulmonary disease, unspecified: Secondary | ICD-10-CM | POA: Diagnosis present

## 2024-03-13 DIAGNOSIS — G8929 Other chronic pain: Secondary | ICD-10-CM | POA: Diagnosis present

## 2024-03-13 DIAGNOSIS — E538 Deficiency of other specified B group vitamins: Secondary | ICD-10-CM

## 2024-03-13 DIAGNOSIS — Z7982 Long term (current) use of aspirin: Secondary | ICD-10-CM | POA: Insufficient documentation

## 2024-03-13 DIAGNOSIS — D72829 Elevated white blood cell count, unspecified: Secondary | ICD-10-CM | POA: Insufficient documentation

## 2024-03-13 DIAGNOSIS — E119 Type 2 diabetes mellitus without complications: Secondary | ICD-10-CM | POA: Insufficient documentation

## 2024-03-13 DIAGNOSIS — F17200 Nicotine dependence, unspecified, uncomplicated: Secondary | ICD-10-CM | POA: Diagnosis present

## 2024-03-13 DIAGNOSIS — R4701 Aphasia: Principal | ICD-10-CM | POA: Insufficient documentation

## 2024-03-13 DIAGNOSIS — J209 Acute bronchitis, unspecified: Secondary | ICD-10-CM | POA: Diagnosis present

## 2024-03-13 DIAGNOSIS — Z7984 Long term (current) use of oral hypoglycemic drugs: Secondary | ICD-10-CM | POA: Insufficient documentation

## 2024-03-13 DIAGNOSIS — R519 Headache, unspecified: Secondary | ICD-10-CM | POA: Insufficient documentation

## 2024-03-13 DIAGNOSIS — E785 Hyperlipidemia, unspecified: Secondary | ICD-10-CM | POA: Diagnosis present

## 2024-03-13 DIAGNOSIS — Z8616 Personal history of COVID-19: Secondary | ICD-10-CM | POA: Insufficient documentation

## 2024-03-13 LAB — ECHOCARDIOGRAM COMPLETE BUBBLE STUDY
AR max vel: 2.84 cm2
AV Area VTI: 3.64 cm2
AV Area mean vel: 2.86 cm2
AV Mean grad: 3 mmHg
AV Peak grad: 6 mmHg
Ao pk vel: 1.22 m/s
Area-P 1/2: 5.75 cm2
MV VTI: 2.57 cm2
S' Lateral: 2.87 cm

## 2024-03-13 LAB — CBC WITH DIFFERENTIAL/PLATELET
Abs Immature Granulocytes: 0.06 10*3/uL (ref 0.00–0.07)
Basophils Absolute: 0.1 10*3/uL (ref 0.0–0.1)
Basophils Relative: 1 %
Eosinophils Absolute: 0.2 10*3/uL (ref 0.0–0.5)
Eosinophils Relative: 2 %
HCT: 41.9 % (ref 36.0–46.0)
Hemoglobin: 13.7 g/dL (ref 12.0–15.0)
Immature Granulocytes: 1 %
Lymphocytes Relative: 35 %
Lymphs Abs: 4.4 10*3/uL — ABNORMAL HIGH (ref 0.7–4.0)
MCH: 28.8 pg (ref 26.0–34.0)
MCHC: 32.7 g/dL (ref 30.0–36.0)
MCV: 88.2 fL (ref 80.0–100.0)
Monocytes Absolute: 0.5 10*3/uL (ref 0.1–1.0)
Monocytes Relative: 4 %
Neutro Abs: 7.4 10*3/uL (ref 1.7–7.7)
Neutrophils Relative %: 57 %
Platelets: 309 10*3/uL (ref 150–400)
RBC: 4.75 MIL/uL (ref 3.87–5.11)
RDW: 14.3 % (ref 11.5–15.5)
WBC: 12.7 10*3/uL — ABNORMAL HIGH (ref 4.0–10.5)
nRBC: 0 % (ref 0.0–0.2)

## 2024-03-13 LAB — CBG MONITORING, ED
Glucose-Capillary: 110 mg/dL — ABNORMAL HIGH (ref 70–99)
Glucose-Capillary: 212 mg/dL — ABNORMAL HIGH (ref 70–99)
Glucose-Capillary: 106 mg/dL — ABNORMAL HIGH (ref 70–99)

## 2024-03-13 LAB — URINALYSIS, ROUTINE W REFLEX MICROSCOPIC
Bacteria, UA: NONE SEEN
Bilirubin Urine: NEGATIVE
Glucose, UA: NEGATIVE mg/dL
Ketones, ur: NEGATIVE mg/dL
Leukocytes,Ua: NEGATIVE
Nitrite: NEGATIVE
Protein, ur: NEGATIVE mg/dL
Specific Gravity, Urine: 1.005 (ref 1.005–1.030)
pH: 5 (ref 5.0–8.0)

## 2024-03-13 LAB — GLUCOSE, CAPILLARY
Glucose-Capillary: 114 mg/dL — ABNORMAL HIGH (ref 70–99)
Glucose-Capillary: 150 mg/dL — ABNORMAL HIGH (ref 70–99)
Glucose-Capillary: 170 mg/dL — ABNORMAL HIGH (ref 70–99)
Glucose-Capillary: 172 mg/dL — ABNORMAL HIGH (ref 70–99)

## 2024-03-13 LAB — COMPREHENSIVE METABOLIC PANEL WITH GFR
ALT: 12 U/L (ref 0–44)
AST: 19 U/L (ref 15–41)
Albumin: 4.1 g/dL (ref 3.5–5.0)
Alkaline Phosphatase: 81 U/L (ref 38–126)
Anion gap: 15 (ref 5–15)
BUN: 13 mg/dL (ref 8–23)
CO2: 20 mmol/L — ABNORMAL LOW (ref 22–32)
Calcium: 9.1 mg/dL (ref 8.9–10.3)
Chloride: 98 mmol/L (ref 98–111)
Creatinine, Ser: 0.75 mg/dL (ref 0.44–1.00)
GFR, Estimated: >60 mL/min
Glucose, Bld: 194 mg/dL — ABNORMAL HIGH (ref 70–99)
Potassium: 3.8 mmol/L (ref 3.5–5.1)
Sodium: 133 mmol/L — ABNORMAL LOW (ref 135–145)
Total Bilirubin: <0.2 mg/dL (ref 0.0–1.2)
Total Protein: 6.7 g/dL (ref 6.5–8.1)

## 2024-03-13 LAB — PROTIME-INR
INR: 0.9 (ref 0.8–1.2)
Prothrombin Time: 12.7 s (ref 11.4–15.2)

## 2024-03-13 LAB — HEMOGLOBIN A1C
Hgb A1c MFr Bld: 6.3 % — ABNORMAL HIGH (ref 4.8–5.6)
Mean Plasma Glucose: 134.11 mg/dL

## 2024-03-13 LAB — APTT: aPTT: 31 s (ref 24–36)

## 2024-03-13 MED ORDER — SODIUM CHLORIDE 0.9 % IV SOLN: INTRAVENOUS | Status: DC

## 2024-03-13 MED ORDER — DULOXETINE HCL 30 MG PO CPEP
60.0000 mg | ORAL_CAPSULE | Freq: Two times a day (BID) | ORAL | Status: DC
  Administered 2024-03-13 – 2024-03-14 (×2): 60 mg via ORAL
  Filled 2024-03-13 (×2): qty 2

## 2024-03-13 MED ORDER — ACETAMINOPHEN 325 MG PO TABS
650.0000 mg | ORAL_TABLET | ORAL | Status: DC | PRN
  Administered 2024-03-13: 650 mg via ORAL
  Filled 2024-03-13: qty 2

## 2024-03-13 MED ORDER — ACETAMINOPHEN 500 MG PO TABS
1000.0000 mg | ORAL_TABLET | Freq: Once | ORAL | Status: AC
  Administered 2024-03-13: 1000 mg via ORAL
  Filled 2024-03-13: qty 2

## 2024-03-13 MED ORDER — ONDANSETRON HCL 4 MG PO TABS: 4.0000 mg | ORAL_TABLET | Freq: Four times a day (QID) | ORAL | Status: DC | PRN

## 2024-03-13 MED ORDER — HYDROCODONE-ACETAMINOPHEN 5-325 MG PO TABS
1.0000 | ORAL_TABLET | ORAL | Status: DC | PRN
  Filled 2024-03-13: qty 2

## 2024-03-13 MED ORDER — INSULIN ASPART 100 UNIT/ML IJ SOLN: 0.0000 [IU] | Freq: Every day | INTRAMUSCULAR | Status: DC

## 2024-03-13 MED ORDER — INSULIN ASPART 100 UNIT/ML IJ SOLN
0.0000 [IU] | INTRAMUSCULAR | Status: DC
  Administered 2024-03-13: 2 [IU] via SUBCUTANEOUS
  Administered 2024-03-13: 3 [IU] via SUBCUTANEOUS
  Administered 2024-03-14: 2 [IU] via SUBCUTANEOUS
  Administered 2024-03-14 (×2): 1 [IU] via SUBCUTANEOUS
  Filled 2024-03-13: qty 2
  Filled 2024-03-13: qty 5
  Filled 2024-03-13: qty 3
  Filled 2024-03-13: qty 1

## 2024-03-13 MED ORDER — ACETAMINOPHEN 160 MG/5ML PO SOLN: 650.0000 mg | ORAL | Status: DC | PRN

## 2024-03-13 MED ORDER — PANTOPRAZOLE SODIUM 40 MG PO TBEC
40.0000 mg | DELAYED_RELEASE_TABLET | Freq: Two times a day (BID) | ORAL | Status: DC
  Administered 2024-03-13 – 2024-03-14 (×2): 40 mg via ORAL
  Filled 2024-03-13 (×2): qty 1

## 2024-03-13 MED ORDER — ACETAMINOPHEN 325 MG PO TABS: 650.0000 mg | ORAL_TABLET | Freq: Four times a day (QID) | ORAL | Status: DC | PRN

## 2024-03-13 MED ORDER — STROKE: EARLY STAGES OF RECOVERY BOOK: Freq: Once | Status: AC

## 2024-03-13 MED ORDER — IPRATROPIUM-ALBUTEROL 0.5-2.5 (3) MG/3ML IN SOLN: 3.0000 mL | Freq: Four times a day (QID) | RESPIRATORY_TRACT | Status: DC | PRN

## 2024-03-13 MED ORDER — INSULIN ASPART 100 UNIT/ML IJ SOLN
0.0000 [IU] | Freq: Three times a day (TID) | INTRAMUSCULAR | Status: DC
  Administered 2024-03-14: 2 [IU] via SUBCUTANEOUS
  Filled 2024-03-13: qty 2

## 2024-03-13 MED ORDER — ENOXAPARIN SODIUM 40 MG/0.4ML IJ SOSY
40.0000 mg | PREFILLED_SYRINGE | INTRAMUSCULAR | Status: DC
  Administered 2024-03-13 – 2024-03-14 (×2): 40 mg via SUBCUTANEOUS
  Filled 2024-03-13 (×2): qty 0.4

## 2024-03-13 MED ORDER — ACETAMINOPHEN 650 MG RE SUPP: 650.0000 mg | RECTAL | Status: DC | PRN

## 2024-03-13 MED ORDER — NICOTINE 7 MG/24HR TD PT24
7.0000 mg | MEDICATED_PATCH | Freq: Once | TRANSDERMAL | Status: AC
  Administered 2024-03-13: 7 mg via TRANSDERMAL
  Filled 2024-03-13: qty 1

## 2024-03-13 MED ORDER — ONDANSETRON HCL 4 MG/2ML IJ SOLN: 4.0000 mg | Freq: Four times a day (QID) | INTRAMUSCULAR | Status: DC | PRN

## 2024-03-13 MED ORDER — IBUPROFEN 600 MG PO TABS
600.0000 mg | ORAL_TABLET | Freq: Once | ORAL | Status: AC
  Administered 2024-03-13: 600 mg via ORAL
  Filled 2024-03-13: qty 1

## 2024-03-13 MED ORDER — MORPHINE SULFATE (PF) 2 MG/ML IV SOLN: 2.0000 mg | INTRAVENOUS | Status: DC | PRN

## 2024-03-13 MED ORDER — ACETAMINOPHEN 650 MG RE SUPP: 650.0000 mg | Freq: Four times a day (QID) | RECTAL | Status: DC | PRN

## 2024-03-13 NOTE — ED Provider Notes (Signed)
 "  [facility] Provider Note    Event Date/Time   First MD Initiated Contact with Patient 03/13/24 0424     (approximate)   History   No chief complaint on file.   HPI  Laurie Berg is a 64 y.o. female   Past medical history of smoker, COPD, oxygen at nighttime, diabetes type 2 who presents to the emergency department with transient word finding difficulties.  She was in her regular state of health leading up to tonight when she ate some donuts, and then her daughter found her with some stuttering speech and asked her some basic questions to test but the patient was unable to answer.  The patient remembers everything and recalls being asked questions but unable to form the correct words to answer the questions.  This lasted a full 20 minutes.  And then it spontaneously resolved.  During this episode she had no other neurologic changes to report, denies facial droop, motor or sensory changes, ataxia.  Daughters correspond there were no other changes noted.  By the time EMS arrived, she was back to normal.  She has aspirin  in her medication list but she states that she takes [facility] powder only as needed.  She continues to smoke cigarettes.  Denies any drug or alcohol use.  No history of a stroke.  Independent Historian contributed to assessment above: Both daughters at bedside to corroborate information past medical history as above  External Medical Documents Reviewed: Previous outpatient pulmonology notes      Physical Exam   Triage Vital Signs: ED Triage Vitals  Encounter Vitals Group     BP 03/13/24 0047 (!) 141/87     Girls Systolic BP Percentile --      Girls Diastolic BP Percentile --      Boys Systolic BP Percentile --      Boys Diastolic BP Percentile --      Pulse Rate 03/13/24 0047 (!) 103     Resp 03/13/24 0047 18     Temp 03/13/24 0047 98.5 F (36.9 C)     Temp src --      SpO2 03/13/24 0047 93 %     Weight 03/13/24 0046 162 lb  (73.5 kg)     Height 03/13/24 0046 5' 4 (1.626 m)     Head Circumference --      Peak Flow --      Pain Score 03/13/24 0046 0     Pain Loc --      Pain Education --      Exclude from Growth Chart --     Most recent vital signs: Vitals:   03/13/24 0047  BP: (!) 141/87  Pulse: (!) 103  Resp: 18  Temp: 98.5 F (36.9 C)  SpO2: 93%    General: Awake, no distress.  CV:  Good peripheral perfusion.  Resp:  Normal effort.  Abd:  No distention.  Other:  Pleasant woman slightly hypertensive otherwise vital signs unremarkable.  Answering all my questions appropriately.  No dysarthria or facial asymmetry, motor or sensory changes, and her finger-nose coordination and gait is normal.  Lungs clear without focality or wheezing.   ED Results / Procedures / Treatments   Labs (all labs ordered are listed, but only abnormal results are displayed) Labs Reviewed  CBC WITH DIFFERENTIAL/PLATELET - Abnormal; Notable for the following components:      Result Value   WBC 12.7 (*)    Lymphs Abs 4.4 (*)  All other components within normal limits  COMPREHENSIVE METABOLIC PANEL WITH GFR - Abnormal; Notable for the following components:   Sodium 133 (*)    CO2 20 (*)    Glucose, Bld 194 (*)    All other components within normal limits  URINALYSIS, ROUTINE W REFLEX MICROSCOPIC  PROTIME-INR  APTT  CBG MONITORING, ED     I ordered and reviewed the above labs they are notable for mild nonspecific leukocytosis, otherwise cell counts and electrolytes largely unremarkable except for mild hyperglycemia at 194.     RADIOLOGY I independently reviewed and interpreted CT head and see no obvious bleeding or midline shift I also reviewed radiologist's formal read.   PROCEDURES:  Critical Care performed: Yes, see critical care procedure note(s)  .Critical Care  Performed by: Cyrena Mylar, MD Authorized by: Cyrena Mylar, MD   Critical care provider statement:    Critical care time (minutes):   35   Critical care was necessary to treat or prevent imminent or life-threatening deterioration of the following conditions:  CNS failure or compromise   Critical care was time spent personally by me on the following activities:  Development of treatment plan with patient or surrogate, discussions with consultants, evaluation of patient's response to treatment, examination of patient, ordering and review of laboratory studies, ordering and review of radiographic studies, ordering and performing treatments and interventions, pulse oximetry, re-evaluation of patient's condition and review of old charts    MEDICATIONS ORDERED IN ED: Medications  nicotine  (NICODERM CQ  - dosed in mg/24 hr) patch 7 mg (has no administration in time range)    External physician / consultants:  I spoke with hospital medicine for admission and regarding care plan for this patient.   IMPRESSION / MDM / ASSESSMENT AND PLAN / ED COURSE  I reviewed the triage vital signs and the nursing notes.                                Patient's presentation is most consistent with acute presentation with potential threat to life or bodily function.  Differential diagnosis includes, but is not limited to, TIA, electrolyte derangement, hypoglycemia, intoxicant    MDM:    Patient with expressive aphasia lasting 20 minutes now spontaneously resolved.  Concerning for TIA and certainly has risk factors for such an event.  ABCD2 score is 5 points: Per the validation study, 4-5 points: Moderate Risk 2-Day Stroke Risk: 4.1% 7-Day Stroke Risk: 5.9% 90-Day Stroke Risk: 9.8%  Canadian TIA score 8 points, medium risk 2% stroke in the next 7 days   Plan for admission for TIA workup.        FINAL CLINICAL IMPRESSION(S) / ED DIAGNOSES   Final diagnoses:  Expressive aphasia  TIA (transient ischemic attack)     Rx / DC Orders   ED Discharge Orders     None        Note:  This document was prepared using Dragon  voice recognition software and may include unintentional dictation errors.    Cyrena Mylar, MD 03/13/24 [phone]  "

## 2024-03-13 NOTE — Progress Notes (Signed)
*  PRELIMINARY RESULTS* Echocardiogram 2D Echocardiogram has been performed.  Floydene Harder 03/13/2024, 2:51 PM

## 2024-03-13 NOTE — H&P (Signed)
 "  History and Physical    Laurie Berg FMW:[id number] DOB: 24-Aug-1960 DOA: 03/13/2024  DOS: the patient was seen and examined on 03/13/2024  PCP: Laurie Rock HERO, MD   Patient coming from: Home  I have personally briefly reviewed patient's old medical records in [facility]  Chief Complaint: Difficulty speaking  HPI: Laurie Berg is a pleasant 64 y.o. female with medical history significant for COPD on home oxygen during the night, chronic active smoking, diabetes who presented to ED via private vehicle complaining of transient word finding difficulties started 2200 last night.  Patient remembers everything and also daughter was at bedside.  All the symptoms resolved in 20 minutes.  Patient denies any of the symptoms at this point.  ED Course: Upon arrival to the ED, patient is found to be tachycardic at 103, HTN 141/87, WBC 12.7, CT head without any acute pathology.  Hospitalist service was consulted for evaluation for TIA.  Review of Systems:  ROS  All other systems negative except as noted in the HPI.  Past Medical History:  Diagnosis Date   Cervical radiculopathy    Chronic pain syndrome    Colon polyps    COPD (chronic obstructive pulmonary disease) (HCC)    Coronary artery calcification seen on CT scan    a. 04/2021 High Res CT Chest: Ao atherosclerosis, cor Ca2+; b. 07/2021 MV: EF 69%, no isch/infarct, LAD/LCX Ca2+ noted; c. 02/2022 CT Chest: Cor and Ao atherosclerosis.   COVID-19 virus infection    Diabetes mellitus without complication (HCC)    History of echocardiogram    a. 08/2021 Echo: EF 55-60%, no rwma, nl RV fxn.   Lung nodule    a. 02/2022 CT Chest: RLL 7mm solid subpleural pulm nodule, prev 24mm-->rec thoracic surgery eval.   Sepsis (HCC)    Tobacco abuse     Past Surgical History:  Procedure Laterality Date   CARPAL TUNNEL RELEASE     COLONOSCOPY WITH PROPOFOL  N/A 10/21/2019   Procedure: COLONOSCOPY WITH PROPOFOL ;  Surgeon: Janalyn Keene NOVAK, MD;   Location: [facility] ENDOSCOPY;  Service: Endoscopy;  Laterality: N/A;   DILATION AND CURETTAGE OF UTERUS     TUBAL LIGATION       reports that she has been smoking cigarettes. She has a 84 pack-year smoking history. She has never used smokeless tobacco. She reports that she does not currently use alcohol. She reports that she does not use drugs.  Allergies[1]  History reviewed. No pertinent family history.  Prior to Admission medications  Medication Sig Start Date End Date Taking? Authorizing Provider  albuterol  (VENTOLIN  HFA) 108 (90 Base) MCG/ACT inhaler Inhale 1 puff into the lungs as needed. 09/11/19   [provider]  aspirin  81 MG chewable tablet Chew 1 tablet (81 mg total) by mouth daily. 03/22/22   Awanda City, MD  Aspirin -Salicylamide-Caffeine (BC HEADACHE POWDER PO) Take 1 packet by mouth as needed (pain).    [provider]  atorvastatin  (LIPITOR) 20 MG tablet Take 1 tablet (20 mg total) by mouth daily. 04/26/22   Darliss Rogue, MD  benzonatate  (TESSALON ) 200 MG capsule Take 1 capsule (200 mg total) by mouth 3 (three) times daily as needed for cough. 05/05/23   Jhonny Calvin NOVAK, MD  Blood Glucose Monitoring Suppl DEVI 1 each by Does not apply route in the morning, at noon, and at bedtime. May substitute to any manufacturer covered by patient's insurance. 03/21/22   Awanda City, MD  cetirizine (ZYRTEC) 10 MG tablet  Take 10 mg by mouth daily. 09/25/19   [provider]  cyclobenzaprine  (FLEXERIL ) 10 MG tablet Take 10 mg by mouth 2 (two) times daily as needed for muscle spasms. 09/19/21   [provider]  DULoxetine  (CYMBALTA ) 30 MG capsule Take 90 mg by mouth daily.    [provider]  ipratropium-albuterol  (DUONEB) 0.5-2.5 (3) MG/3ML SOLN Take 3 mLs by nebulization every 6 (six) hours as needed. 03/21/22   Awanda City, MD  LINZESS  72 MCG capsule Take 72 mcg by mouth every morning. 03/22/21   [provider]  metFORMIN  (GLUCOPHAGE -XR) 500 MG 24 hr  tablet Take 2 tablets (1,000 mg total) by mouth 2 (two) times daily with a meal. 04/27/23 07/26/23  Maree Hue, MD  Multiple Vitamin (MULTIVITAMIN WITH MINERALS) TABS tablet Take 1 tablet by mouth daily. 03/22/22   Awanda City, MD  naloxone  (NARCAN ) nasal spray 4 mg/0.1 mL One spray in either nostril once for known/suspected opioid overdose. May repeat every 2-3 minutes in alternating nostril til EMS arrives 12/13/21   [provider]  omeprazole (PRILOSEC) 40 MG capsule Take 40 mg by mouth daily. 03/27/22   [provider]  oxyCODONE -acetaminophen  (PERCOCET/ROXICET) 5-325 MG tablet Take 1 tablet by mouth every 6 (six) hours as needed.    [provider]  OXYCONTIN  30 MG 12 hr tablet Take 1 tablet by mouth 2 (two) times daily. 06/26/21   [provider]  polyethylene glycol (MIRALAX  / GLYCOLAX ) 17 g packet Take 17 g by mouth 2 (two) times daily. 04/27/23   Maree Hue, MD  TRADJENTA  5 MG TABS tablet Take 5 mg by mouth daily.    [provider]  DOMINIC BECK 100-62.5-25 MCG/ACT AEPB Take 1 puff by mouth daily. 07/04/21   [provider]    Physical Exam: Vitals:   03/13/24 0046 03/13/24 0047 03/13/24 0513 03/13/24 0733  BP:  (!) 141/87 (!) 150/98   Pulse:  (!) 103 72   Resp:  18 18   Temp:  98.5 F (36.9 C) 98.4 F (36.9 C)   TempSrc:   Oral   SpO2:  93% 98% 98%  Weight: 73.5 kg     Height: 5' 4 (1.626 m)       Physical Exam   Constitutional: Alert, awake, calm, comfortable HEENT: Neck supple Respiratory: Clear to auscultation B/L, no wheezing, no rales.  Cardiovascular: Regular rate and rhythm, no murmurs / rubs / gallops. No extremity edema. 2+ pedal pulses. No carotid bruits.  Abdomen: Soft, no tenderness, Bowel sounds positive.  Musculoskeletal: no clubbing / cyanosis. Good ROM, no contractures. Normal muscle tone.  Skin: no rashes, lesions, ulcers. Neurologic: CN 2-12 grossly intact. Sensation intact, No focal deficit  identified Psychiatric: Alert and oriented x 3. Normal mood.    Labs on Admission: I have personally reviewed following labs and imaging studies  CBC: Recent Labs  Lab 03/13/24 0048  WBC 12.7*  NEUTROABS 7.4  HGB 13.7  HCT 41.9  MCV 88.2  PLT 309   Basic Metabolic Panel: Recent Labs  Lab 03/13/24 0048  NA 133*  K 3.8  CL 98  CO2 20*  GLUCOSE 194*  BUN 13  CREATININE 0.75  CALCIUM  9.1   GFR: Estimated Creatinine Clearance: 72.5 mL/min (by C-G formula based on SCr of 0.75 mg/dL). Liver Function Tests: Recent Labs  Lab 03/13/24 0048  AST 19  ALT 12  ALKPHOS 81  BILITOT <0.2  PROT 6.7  ALBUMIN 4.1   No results for  input(s): LIPASE, AMYLASE in the last 168 hours. No results for input(s): AMMONIA in the last 168 hours. Coagulation Profile: Recent Labs  Lab 03/13/24 0510  INR 0.9   Cardiac Enzymes: No results for input(s): CKTOTAL, CKMB, CKMBINDEX, TROPONINI, TROPONINIHS in the last 168 hours. BNP (last 3 results) Recent Labs    04/30/23 1438  BNP 16.0   HbA1C: No results for input(s): HGBA1C in the last 72 hours. CBG: Recent Labs  Lab 03/13/24 0531 03/13/24 0747  GLUCAP 110* 212*   Lipid Profile: No results for input(s): CHOL, HDL, LDLCALC, TRIG, CHOLHDL, LDLDIRECT in the last 72 hours. Thyroid Function Tests: No results for input(s): TSH, T4TOTAL, FREET4, T3FREE, THYROIDAB in the last 72 hours. Anemia Panel: No results for input(s): VITAMINB12, FOLATE, FERRITIN, TIBC, IRON, RETICCTPCT in the last 72 hours. Urine analysis:    Component Value Date/Time   COLORURINE STRAW (A) 03/13/2024 0914   APPEARANCEUR CLEAR (A) 03/13/2024 0914   LABSPEC 1.005 03/13/2024 0914   PHURINE 5.0 03/13/2024 0914   GLUCOSEU NEGATIVE 03/13/2024 0914   HGBUR SMALL (A) 03/13/2024 0914   BILIRUBINUR NEGATIVE 03/13/2024 0914   KETONESUR NEGATIVE 03/13/2024 0914   PROTEINUR NEGATIVE 03/13/2024 0914   NITRITE  NEGATIVE 03/13/2024 0914   LEUKOCYTESUR NEGATIVE 03/13/2024 0914    Radiological Exams on Admission: I have personally reviewed images CT Head Wo Contrast Result Date: 03/13/2024 EXAM: CT HEAD WITHOUT CONTRAST 03/13/2024 01:15:16 AM TECHNIQUE: CT of the head was performed without the administration of intravenous contrast. Automated exposure control, iterative reconstruction, and/or weight based adjustment of the mA/kV was utilized to reduce the radiation dose to as low as reasonably achievable. COMPARISON: 04/24/2023 CLINICAL HISTORY: Confusion and slurred speech. FINDINGS: BRAIN AND VENTRICLES: Chronic white matter ischemic changes are noted. No acute hemorrhage or acute infarct is seen. No extra-axial collection. No mass effect or midline shift. ORBITS: No acute abnormality. SINUSES: No acute abnormality. SOFT TISSUES AND SKULL: No acute soft tissue abnormality. No skull fracture. IMPRESSION: 1. Chronic ischemic changes without acute abnormality. Electronically signed by: Oneil Devonshire MD 03/13/2024 01:21 AM EST RP Workstation: [id number]    EKG: My personal interpretation of EKG shows: Normal sinus rhythm    Assessment/Plan Principal Problem:   TIA (transient ischemic attack) Active Problems:   COPD with acute bronchitis (HCC)   Tobacco use disorder   GERD without esophagitis   HLD (hyperlipidemia)    Assessment and Plan: 64 year old female double/PMH of COPD, chronic active smoking, GERD, HLD presented to ED with word finding difficulties.  1.  TIA - I will place her on observation - Placed on TIA protocol - Monitor on telemetry, ultrasound of the carotids, MRI of the brain - PT/OT/ST - Consider neurology consult if there is any positive findings on the test or clinical finding  2.  COPD without any exacerbation - Continue nebulization and her home medications - Continue to monitor oxygen to maintain saturation more than 90% - Patient takes oxygen only during night  3.   Diabetes - Patient does not take insulin  at home. - Her oral hypoglycemic agents will be held while she is in the hospital. - Continue insulin  sliding scale.  4.  Chronic active smoking - Counseling was done  5.  Mild leukocytosis - There is no signs of sepsis or infection. - I will check urine analysis     DVT prophylaxis: Lovenox  Code Status: Full Code Family Communication: Daughter was at bedside Disposition Plan: Home Consults called: None Admission status: Observation, Telemetry bed  Nena Rebel, MD [facility] 03/13/2024, 10:24 AM       [1]  Allergies Allergen Reactions   Pregabalin Shortness Of Breath and Swelling    Felt like I couldn't breathe and my tongue was swelling up   Sulfamethoxazole-Trimethoprim Itching   "

## 2024-03-13 NOTE — Progress Notes (Addendum)
 SLP Cancellation Note  Patient Details Name: Laurie Berg MRN: [id number] DOB: [birth year]   Cancelled treatment:       Reason Eval/Treat Not Completed: SLP screened, no needs identified, will sign off (chart reviewed; consulted NSG and met w/ pt in room. Visitor+)   Pt is a 64 y.o. female presents with slurred speech and confusion, treated for possible TIA. PMH of COPD with nighttime 02 and DM.  Per ED note in chart, she was confused and slurred her words around 2200 last night; pt was seen by paramedics at her house and then came to the ED via POV.  Pt is at baseline now per family and pt. Pt denies numbness or weakness in extremities.  MRI:  No significant abnormality. 2. Advanced white matter changes, most likely due to chronic microvascular ischemic change.  Pt denied any difficulty swallowing and is currently on a regular diet now post updating MD re: Passed nursing swallow screen this morning.  Pt conversed in Full conversation w/out expressive/receptive deficits noted; pt denied any current speech-language deficits. Speech clear. Restaurant Manager, [employer] for pt via phone, then gave phone to pt for her to place her Lunch order (done appropriately w/ ease via observation).  No further skilled ST services indicated as pt appears at her baseline. Pt agreed. NSG to reconsult if any change in status while admitted.      Comer Portugal, MS, CCC-SLP Speech Language Pathologist Rehab Services; [facility] [phone] (ascom) Hillari Zumwalt 03/13/2024, 1:17 PM

## 2024-03-13 NOTE — Progress Notes (Signed)
 PT Cancellation Note  Patient Details Name: Laurie Berg MRN: [id number] DOB: Jun 23, 1960   Cancelled Treatment:    Reason Eval/Treat Not Completed: Patient at procedure or test/unavailable (Pt reports back to baseline at this time, denies any transience of balance, strength, mobility, etc. Will sign off.) Author offered to provide in depth assessment to r/o any occult deficits, pt reports not necessary at this time.   9:50 AM, 03/13/24 [name] Linear, PT, DPT Physical Therapist - [facility]  [phone] (ASCOM)     [name] 03/13/2024, 9:49 AM

## 2024-03-13 NOTE — ED Triage Notes (Signed)
 Per family pt had an episode where she was confused and slurred her words around 2200 tonight, pt was seen by paramedics at her house and then came POV. Pt is at baseline now per family and pt. Pt denies numbness or weakness in extremities. Pt does not take blood thinners, hx of COPD and DM.

## 2024-03-13 NOTE — Evaluation (Signed)
 Occupational Therapy Evaluation Patient Details Name: Laurie Berg MRN: [id number] DOB: September 22, 1960 Today's Date: 03/13/2024   History of Present Illness   64 y.o. female presents with slurred speech and confusion, treated for possible TIA. PMH of COPD with nighttime 02 and DM.     Clinical Impressions Pt was seen for OT evaluation this date. PTA, pt lives with her daughter in a one level home with 5 STE and bil HR. She is indep for ADLs and ambulating household and limited community distances without AD. She wears 02 at night only and denies falls. Pt presenting with confusion and slurred speech, however was back to her baseline once in ED. Pt denies any further deficits and demo indep with all bed mobility, transfers and ambulation ~50 ft without AD use. BUE ROM, strength, sensation and coordination all intact. Denies vision changes. Sp02 WFL on RA throughout session. Pt's daughters at bedside and deny further confusion or speech deficits. Pt appears back to her baseline level of function and does not require further acute OT services or follow up services upon DC. Will sign off in house.     If plan is discharge home, recommend the following:         Functional Status Assessment   Patient has not had a recent decline in their functional status     Equipment Recommendations   None recommended by OT     Recommendations for Other Services         Precautions/Restrictions   Precautions Precautions: None Recall of Precautions/Restrictions: Intact Restrictions Weight Bearing Restrictions Per Provider Order: No     Mobility Bed Mobility Overal bed mobility: Independent                  Transfers Overall transfer level: Independent Equipment used: None               General transfer comment: no assist to ambulate 50 ft without AD use      Balance Overall balance assessment: No apparent balance deficits (not formally assessed)                                          ADL either performed or assessed with clinical judgement   ADL Overall ADL's : Modified independent                                       General ADL Comments: doffed shoes without difficulty, performed all transfers at indep/MOD I level     Vision Patient Visual Report: No change from baseline       Perception         Praxis         Pertinent Vitals/Pain Pain Assessment Pain Assessment: No/denies pain     Extremity/Trunk Assessment Upper Extremity Assessment Upper Extremity Assessment: Overall WFL for tasks assessed   Lower Extremity Assessment Lower Extremity Assessment: Overall WFL for tasks assessed       Communication Communication Communication: No apparent difficulties   Cognition Arousal: Alert Behavior During Therapy: WFL for tasks assessed/performed Cognition: No apparent impairments                               Following commands: Intact  Cueing  General Comments      sp02 92% on RA with activity   Exercises     Shoulder Instructions      Home Living Family/patient expects to be discharged to:: Private residence Living Arrangements: Children Available Help at Discharge: Family;Available PRN/intermittently Type of Home: Mobile home Home Access: Stairs to enter Entrance Stairs-Number of Steps: 5 Entrance Stairs-Rails: Can reach both;Left;Right Home Layout: One level     Bathroom Shower/Tub: Producer, Television/film/video: Standard     Home Equipment: Shower seat          Prior Functioning/Environment Prior Level of Function : Independent/Modified Independent             Mobility Comments: indep no AD use household and limited community distances; denies falls; wear 02 at night ADLs Comments: indep, dtrs drive her to appts, she does not go in grocery stores anymore    OT Problem List:     OT Treatment/Interventions:        OT  Goals(Current goals can be found in the care plan section)       OT Frequency:       Co-evaluation              AM-PAC OT 6 Clicks Daily Activity     Outcome Measure Help from another person eating meals?: None Help from another person taking care of personal grooming?: None Help from another person toileting, which includes using toliet, bedpan, or urinal?: None Help from another person bathing (including washing, rinsing, drying)?: None Help from another person to put on and taking off regular upper body clothing?: None Help from another person to put on and taking off regular lower body clothing?: None 6 Click Score: 24   End of Session    Activity Tolerance: Patient tolerated treatment well Patient left: with family/visitor present                   Time: 9174-9161 OT Time Calculation (min): 13 min Charges:  OT General Charges $OT Visit: 1 Visit OT Evaluation $OT Eval Low Complexity: 1 Low  [name], OTR/L  03/13/24, 9:56 AM   [name] 03/13/2024, 9:53 AM

## 2024-03-14 ENCOUNTER — Other Ambulatory Visit: Payer: Self-pay

## 2024-03-14 DIAGNOSIS — R519 Headache, unspecified: Secondary | ICD-10-CM | POA: Insufficient documentation

## 2024-03-14 DIAGNOSIS — E538 Deficiency of other specified B group vitamins: Secondary | ICD-10-CM

## 2024-03-14 LAB — URINE CULTURE: Culture: NO GROWTH

## 2024-03-14 LAB — LIPID PANEL
Cholesterol: 190 mg/dL (ref 0–200)
HDL: 39 mg/dL — ABNORMAL LOW
LDL Cholesterol: 92 mg/dL (ref 0–99)
Total CHOL/HDL Ratio: 4.9 ratio
Triglycerides: 294 mg/dL — ABNORMAL HIGH
VLDL: 59 mg/dL — ABNORMAL HIGH (ref 0–40)

## 2024-03-14 LAB — VITAMIN B12: Vitamin B-12: 175 pg/mL — ABNORMAL LOW (ref 180–914)

## 2024-03-14 LAB — SYPHILIS: RPR W/REFLEX TO RPR TITER AND TREPONEMAL ANTIBODIES, TRADITIONAL SCREENING AND DIAGNOSIS ALGORITHM: RPR Ser Ql: NONREACTIVE

## 2024-03-14 LAB — GLUCOSE, CAPILLARY
Glucose-Capillary: 144 mg/dL — ABNORMAL HIGH (ref 70–99)
Glucose-Capillary: 156 mg/dL — ABNORMAL HIGH (ref 70–99)

## 2024-03-14 LAB — TSH: TSH: 0.928 u[IU]/mL (ref 0.350–4.500)

## 2024-03-14 MED ORDER — CLOPIDOGREL BISULFATE 75 MG PO TABS
75.0000 mg | ORAL_TABLET | Freq: Every day | ORAL | Status: DC
  Administered 2024-03-14: 75 mg via ORAL
  Filled 2024-03-14: qty 1

## 2024-03-14 MED ORDER — FLUTICASONE PROPIONATE 50 MCG/ACT NA SUSP: 1.0000 | Freq: Every day | NASAL | 2 refills | Status: AC

## 2024-03-14 MED ORDER — CLOPIDOGREL BISULFATE 75 MG PO TABS: 75.0000 mg | ORAL_TABLET | Freq: Every day | ORAL | 0 refills | Status: AC

## 2024-03-14 MED ORDER — PANTOPRAZOLE SODIUM 40 MG PO TBEC: 40.0000 mg | DELAYED_RELEASE_TABLET | Freq: Every day | ORAL | 0 refills | Status: AC

## 2024-03-14 MED ORDER — ATORVASTATIN CALCIUM 40 MG PO TABS: 40.0000 mg | ORAL_TABLET | Freq: Every day | ORAL | 0 refills | Status: DC

## 2024-03-14 MED ORDER — ASPIRIN 81 MG PO TBEC
81.0000 mg | DELAYED_RELEASE_TABLET | Freq: Every day | ORAL | Status: DC
  Administered 2024-03-14: 81 mg via ORAL
  Filled 2024-03-14: qty 1

## 2024-03-14 MED ORDER — ASPIRIN 81 MG PO CHEW: 81.0000 mg | CHEWABLE_TABLET | Freq: Every day | ORAL | 0 refills | Status: AC

## 2024-03-14 MED ORDER — ROSUVASTATIN CALCIUM 20 MG PO TABS: 20.0000 mg | ORAL_TABLET | Freq: Every day | ORAL | 0 refills | Status: AC

## 2024-03-14 MED ORDER — ATORVASTATIN CALCIUM 20 MG PO TABS
20.0000 mg | ORAL_TABLET | Freq: Every day | ORAL | Status: DC
  Administered 2024-03-14: 20 mg via ORAL
  Filled 2024-03-14: qty 1

## 2024-03-14 NOTE — Assessment & Plan Note (Addendum)
 Hemoglobin A1c 6.3 on metformin 

## 2024-03-14 NOTE — Assessment & Plan Note (Signed)
 LDL 92, goal less than 70.  Switch pravastatin over to Crestor .

## 2024-03-14 NOTE — Hospital Course (Addendum)
 64 y.o. female with medical history significant for COPD on home oxygen during the night, chronic active smoking, diabetes who presented to ED via private vehicle complaining of transient word finding difficulties started 2200 last night.  Patient remembers everything and also daughter was at bedside.  All the symptoms resolved in 20 minutes.  Patient denies any of the symptoms at this point.   ED Course: Upon arrival to the ED, patient is found to be tachycardic at 103, HTN 141/87, WBC 12.7, CT head without any acute pathology.  Hospitalist service was consulted for evaluation for TIA.  1/30.  Patient's symptoms resolved.  No difficulty walking so no PT evaluation was needed.  Patient ambulated around the hallway.  Started aspirin  and Plavix  and Crestor .  MRI negative for stroke.

## 2024-03-14 NOTE — Discharge Summary (Signed)
 " Physician Discharge Summary   Patient: Laurie Berg MRN: [id number] DOB: Dec 03, 1960  Admit date:     03/13/2024  Discharge date: 03/14/24  Discharge Physician: Charlie Patterson   PCP: Buren Rock HERO, MD   Recommendations at discharge:   Follow-up PCP 5 days Can consider thyroid ultrasound as outpatient.  Discharge Diagnoses: Principal Problem:   TIA (transient ischemic attack) Active Problems:   HLD (hyperlipidemia)   Controlled type 2 diabetes mellitus without complication, without long-term current use of insulin  (HCC)   COPD (chronic obstructive pulmonary disease) (HCC)   Vitamin B12 deficiency   Tobacco use disorder   Chronic pain   GERD without esophagitis   Headache  Resolved Problems:   COPD with acute bronchitis [facility])  Hospital Course: 64 y.o. female with medical history significant for COPD on home oxygen during the night, chronic active smoking, diabetes who presented to ED via private vehicle complaining of transient word finding difficulties started 2200 last night.  Patient remembers everything and also daughter was at bedside.  All the symptoms resolved in 20 minutes.  Patient denies any of the symptoms at this point.   ED Course: Upon arrival to the ED, patient is found to be tachycardic at 103, HTN 141/87, WBC 12.7, CT head without any acute pathology.  Hospitalist service was consulted for evaluation for TIA.  1/30.  Patient's symptoms resolved.  No difficulty walking so no PT evaluation was needed.  Patient ambulated around the hallway.  Started aspirin  and Plavix  and Crestor .  MRI negative for stroke.  Assessment and Plan: * TIA (transient ischemic attack) Patient ambulated around without any issues and did not need a physical therapy or Occupational Therapy evaluation.  Patient had slurred speech and trouble getting her words out.  Aspirin  and Plavix  for 21 days then aspirin  after that.  Switched her cholesterol medication over to Crestor .  MRI of the  brain negative for stroke.  Carotid ultrasound did not show any plaque.  Echocardiogram did not show source of stroke.  HLD (hyperlipidemia) LDL 92, goal less than 70.  Switch pravastatin over to Crestor .  Controlled type 2 diabetes mellitus without complication, without long-term current use of insulin  (HCC) Hemoglobin A1c 6.3 on metformin   COPD (chronic obstructive pulmonary disease) (HCC) Respiratory status stable.  Patient wears nocturnal oxygen.  Vitamin B12 deficiency Oral supplementation recommended.  This level came back after patient discharge.  Sent off because daughter states that she has had some memory loss.  Tobacco use disorder On Chantix  Headache Patient must cut down on BC powder especially while taking aspirin .  High risk of bleeding while on blood thinners.  Since her issue is mostly sinuses I prescribed Flonase  nasal spray.  Chronic pain Careful with pain medications can also cause slurred speech and memory issues.  PCP can address as outpatient.         Consultants: None Procedures performed: None Disposition: Home Diet recommendation:  Cardiac and Carb modified diet DISCHARGE MEDICATION: Allergies as of 03/14/2024       Reactions   Pregabalin Shortness Of Breath, Swelling   Felt like I couldn't breathe and my tongue was swelling up   Sulfamethoxazole-trimethoprim Itching        Medication List     STOP taking these medications    atorvastatin  20 MG tablet Commonly known as: LIPITOR   BC HEADACHE POWDER PO   benzonatate  200 MG capsule Commonly known as: TESSALON    Linzess  72 MCG capsule Generic drug: linaclotide   lovastatin 40 MG tablet Commonly known as: MEVACOR   naloxone  4 MG/0.1ML Liqd nasal spray kit Commonly known as: NARCAN    omeprazole 40 MG capsule Commonly known as: PRILOSEC   polyethylene glycol 17 g packet Commonly known as: MIRALAX  / GLYCOLAX        TAKE these medications    albuterol  108 (90 Base)  MCG/ACT inhaler Commonly known as: VENTOLIN  HFA Inhale 1 puff into the lungs as needed.   aspirin  81 MG chewable tablet Chew 1 tablet (81 mg total) by mouth daily.   Blood Glucose Monitoring Suppl Devi 1 each by Does not apply route in the morning, at noon, and at bedtime. May substitute to any manufacturer covered by patient's insurance.   cetirizine 10 MG tablet Commonly known as: ZYRTEC Take 10 mg by mouth daily.   clopidogrel  75 MG tablet Commonly known as: PLAVIX  Take 1 tablet (75 mg total) by mouth daily for 20 days. Start taking on: March 15, 2024   cyclobenzaprine  10 MG tablet Commonly known as: FLEXERIL  Take 10 mg by mouth 2 (two) times daily as needed for muscle spasms.   DULoxetine  60 MG capsule Commonly known as: CYMBALTA  Take 60 mg by mouth 2 (two) times daily.   fluticasone  50 MCG/ACT nasal spray Commonly known as: FLONASE  Place 1 spray into both nostrils daily.   ipratropium-albuterol  0.5-2.5 (3) MG/3ML Soln Commonly known as: DUONEB Take 3 mLs by nebulization every 6 (six) hours as needed.   lidocaine  5 % Commonly known as: LIDODERM  Place 1 patch onto the skin daily.   metFORMIN  500 MG 24 hr tablet Commonly known as: GLUCOPHAGE -XR Take 2 tablets (1,000 mg total) by mouth 2 (two) times daily with a meal.   multivitamin with minerals Tabs tablet Take 1 tablet by mouth daily.   Ohtuvayre  3 MG/2.5ML Susp Generic drug: Ensifentrine  Inhale 2.5 mLs into the lungs as needed.   oxyCODONE -acetaminophen  5-325 MG tablet Commonly known as: PERCOCET/ROXICET Take 1 tablet by mouth every 6 (six) hours as needed.   OxyCONTIN  30 MG 12 hr tablet Generic drug: oxyCODONE  Take 1 tablet by mouth 2 (two) times daily.   pantoprazole  40 MG tablet Commonly known as: PROTONIX  Take 1 tablet (40 mg total) by mouth daily.   rosuvastatin  20 MG tablet Commonly known as: Crestor  Take 1 tablet (20 mg total) by mouth daily.   Tradjenta  5 MG Tabs tablet Generic drug:  linagliptin  Take 5 mg by mouth daily.   Trelegy Ellipta 100-62.5-25 MCG/ACT Aepb Generic drug: Fluticasone -Umeclidin-Vilant Take 1 puff by mouth daily.   varenicline 1 MG tablet Commonly known as: CHANTIX Take 1 mg by mouth 2 (two) times daily.        Follow-up Information     Buren Rock HERO, MD Follow up in 5 day(s).   Specialty: Family Medicine Why: hospital follow up Contact information: [address] [place] [address] [phone]                Discharge Exam: Filed Weights   03/13/24 0046  Weight: 73.5 kg   Physical Exam HENT:     Head: Normocephalic.  Eyes:     General: Lids are normal.  Cardiovascular:     Rate and Rhythm: Normal rate and regular rhythm.     Heart sounds: Normal heart sounds, S1 normal and S2 normal.  Pulmonary:     Breath sounds: No decreased breath sounds, wheezing, rhonchi or rales.  Abdominal:     Palpations: Abdomen is soft.  Tenderness: There is no abdominal tenderness.  Musculoskeletal:     Right lower leg: No swelling.     Left lower leg: No swelling.  Skin:    General: Skin is warm.     Findings: No rash.  Neurological:     Mental Status: She is alert and oriented to person, place, and time.      Condition at discharge: stable  The results of significant diagnostics from this hospitalization (including imaging, microbiology, ancillary and laboratory) are listed below for reference.   Imaging Studies: ECHOCARDIOGRAM COMPLETE BUBBLE STUDY Result Date: 03/13/2024    ECHOCARDIOGRAM REPORT   Patient Name:   CECILEE ROSNER Date of Exam: 03/13/2024 Medical Rec #:  [id number]        Height:       64.0 in Accession #:    [id number]       Weight:       162.0 lb Date of Birth:  26-Mar-1960        BSA:          1.789 m Patient Age:    63 years         BP:           150/98 mmHg Patient Gender: F                HR:           72 bpm. Exam Location:  [facility] Procedure: 2D Echo, Cardiac Doppler, Color Doppler and Saline  Contrast Bubble            Study (Both Spectral and Color Flow Doppler were utilized during            procedure). Indications:     Stroke 434.91 / I63.9  History:         Patient has prior history of Echocardiogram examinations, most                  recent 03/18/2022. COPD; Risk Factors:Diabetes.  Sonographer:     Christopher Furnace Referring Phys:  [id number] DELAYNE LULLA SOLIAN Diagnosing Phys: Evalene Lunger MD IMPRESSIONS  1. Left ventricular ejection fraction, by estimation, is 55 to 60%. Left ventricular ejection fraction by PLAX is 58 %. The left ventricle has normal function. The left ventricle has no regional wall motion abnormalities. Left ventricular diastolic parameters are consistent with Grade I diastolic dysfunction (impaired relaxation).  2. Right ventricular systolic function is normal. The right ventricular size is normal. Tricuspid regurgitation signal is inadequate for assessing PA pressure.  3. The mitral valve is normal in structure. Mild mitral valve regurgitation. No evidence of mitral stenosis.  4. The aortic valve is normal in structure. Aortic valve regurgitation is mild. No aortic stenosis is present.  5. The inferior vena cava is normal in size with greater than 50% respiratory variability, suggesting right atrial pressure of 3 mmHg.  6. Agitated saline contrast bubble study was negative, with no evidence of any interatrial shunt. FINDINGS  Left Ventricle: Left ventricular ejection fraction, by estimation, is 55 to 60%. Left ventricular ejection fraction by PLAX is 58 %. The left ventricle has normal function. The left ventricle has no regional wall motion abnormalities. Strain was performed and the global longitudinal strain is indeterminate. The left ventricular internal cavity size was normal in size. There is no left ventricular hypertrophy. Left ventricular diastolic parameters are consistent with Grade I diastolic dysfunction  (impaired relaxation). Right Ventricle: The right ventricular size is  normal. No increase in  right ventricular wall thickness. Right ventricular systolic function is normal. Tricuspid regurgitation signal is inadequate for assessing PA pressure. Left Atrium: Left atrial size was normal in size. Right Atrium: Right atrial size was normal in size. Pericardium: There is no evidence of pericardial effusion. Mitral Valve: The mitral valve is normal in structure. Mild mitral valve regurgitation. No evidence of mitral valve stenosis. MV peak gradient, 4.2 mmHg. The mean mitral valve gradient is 2.0 mmHg. Tricuspid Valve: The tricuspid valve is normal in structure. Tricuspid valve regurgitation is not demonstrated. No evidence of tricuspid stenosis. Aortic Valve: The aortic valve is normal in structure. Aortic valve regurgitation is mild. No aortic stenosis is present. Aortic valve mean gradient measures 3.0 mmHg. Aortic valve peak gradient measures 6.0 mmHg. Aortic valve area, by VTI measures 3.64 cm. Pulmonic Valve: The pulmonic valve was normal in structure. Pulmonic valve regurgitation is not visualized. No evidence of pulmonic stenosis. Aorta: The aortic root is normal in size and structure. Venous: The inferior vena cava is normal in size with greater than 50% respiratory variability, suggesting right atrial pressure of 3 mmHg. IAS/Shunts: No atrial level shunt detected by color flow Doppler. Agitated saline contrast was given intravenously to evaluate for intracardiac shunting. Agitated saline contrast bubble study was negative, with no evidence of any interatrial shunt. There  is no evidence of a patent foramen ovale. There is no evidence of an atrial septal defect. Additional Comments: 3D was performed not requiring image post processing on an independent workstation and was indeterminate.  LEFT VENTRICLE PLAX 2D LV EF:         Left            Diastology                ventricular     LV e' medial:    6.00 cm/s                ejection        LV E/e' medial:  12.5                 fraction by     LV e' lateral:   9.96 cm/s                PLAX is 58      LV E/e' lateral: 7.6                %. LVIDd:         4.10 cm LVIDs:         2.87 cm LV PW:         1.21 cm LV IVS:        0.96 cm LVOT diam:     2.30 cm LV SV:         62 LV SV Index:   35 LVOT Area:     4.15 cm LV IVRT:       115 msec  RIGHT VENTRICLE RV Basal diam:  2.98 cm RV Mid diam:    2.31 cm LEFT ATRIUM             Index        RIGHT ATRIUM           Index LA diam:        3.00 cm 1.68 cm/m   RA Area:     10.10 cm LA Vol (A2C):   33.4 ml 18.67 ml/m  RA Volume:   20.20 ml  11.29 ml/m LA  Vol (A4C):   30.2 ml 16.88 ml/m LA Biplane Vol: 32.2 ml 18.00 ml/m  AORTIC VALVE AV Area (Vmax):    2.84 cm AV Area (Vmean):   2.86 cm AV Area (VTI):     3.64 cm AV Vmax:           122.00 cm/s AV Vmean:          80.800 cm/s AV VTI:            0.171 m AV Peak Grad:      6.0 mmHg AV Mean Grad:      3.0 mmHg LVOT Vmax:         83.50 cm/s LVOT Vmean:        55.600 cm/s LVOT VTI:          0.150 m LVOT/AV VTI ratio: 0.88  AORTA Ao Root diam: 3.20 cm MITRAL VALVE MV Area (PHT): 5.75 cm     SHUNTS MV Area VTI:   2.57 cm     Systemic VTI:  0.15 m MV Peak grad:  4.2 mmHg     Systemic Diam: 2.30 cm MV Mean grad:  2.0 mmHg MV Vmax:       1.02 m/s MV Vmean:      73.6 cm/s MV Decel Time: 132 msec MV E velocity: 75.20 cm/s MV A velocity: 108.50 cm/s MV E/A ratio:  0.69 Evalene Lunger MD Electronically signed by Evalene Lunger MD Signature Date/Time: 03/13/2024/4:11:20 PM    Final    MR BRAIN WO CONTRAST Result Date: 03/13/2024 EXAM: MRI BRAIN WITHOUT CONTRAST 03/13/2024 11:59:00 AM TECHNIQUE: Multiplanar multisequence MRI of the head/brain was performed without the administration of intravenous contrast. COMPARISON: None available. CLINICAL HISTORY: Transient ischemic attack (TIA) FINDINGS: BRAIN AND VENTRICLES: No acute infarct. No intracranial hemorrhage. No mass. No midline shift. No hydrocephalus. The sella is unremarkable. Normal flow voids. Advanced  T2 hyperintensities in the white matter. ORBITS: No significant abnormality. SINUSES AND MASTOIDS: No significant abnormality. BONES AND SOFT TISSUES: Normal marrow signal. No soft tissue abnormality. IMPRESSION: 1. No significant abnormality. 2. Advanced white matter changes, most likely due to chronic microvascular ischemic change. Chronic demyelination is thought less likely but could have a similar appearance. Electronically signed by: Glendia Molt MD 03/13/2024 12:12 PM EST RP Workstation: [id number]   US  Carotid Bilateral Result Date: 03/13/2024 CLINICAL DATA:  TIA in a patient with known coronary artery disease, hyperlipidemia, diabetes mellitus, and tobacco use. EXAM: BILATERAL CAROTID DUPLEX ULTRASOUND TECHNIQUE: Elnor scale imaging, color Doppler and duplex ultrasound were performed of bilateral carotid and vertebral arteries in the neck. COMPARISON:  None Available. FINDINGS: Criteria: Quantification of carotid stenosis is based on velocity parameters that correlate the residual internal carotid diameter with NASCET-based stenosis levels, using the diameter of the distal internal carotid lumen as the denominator for stenosis measurement. The following velocity measurements were obtained: RIGHT ICA: 83/29 cm/sec CCA: 84/14 cm/sec SYSTOLIC ICA/CCA RATIO:  1.0 ECA: 83 PSV cm/sec LEFT ICA: 76/30 cm/sec CCA: 103/26 cm/sec SYSTOLIC ICA/CCA RATIO:  0.7 ECA: 115 PSV cm/sec RIGHT CAROTID ARTERY: Heterogenous smooth plaque in the right common carotid artery without significant stenosis. Minimal heterogenous irregular plaque in the right carotid bulb without significant stenosis. Minimal heterogenous irregular plaque in the proximal right ICA without significant stenosis. RIGHT VERTEBRAL ARTERY:  Antegrade arterial flow LEFT CAROTID ARTERY: Smooth heterogenous plaque without significant stenosis in the left common carotid artery. Smooth heterogenous plaque in the left carotid bulb without significant stenosis.  Smooth heterogenous plaque in  the proximal left ICA without significant stenosis. LEFT VERTEBRAL ARTERY:  Antegrade arterial flow IMPRESSION: No significant stenosis in the extracranial carotid arteries bilaterally. Minimal heterogenous atherosclerotic plaque seen bilaterally within the carotid arteries. Multiple lesions identified, but not fully evaluated in the thyroid. Recommend dedicated ultrasound evaluation of the thyroid. Electronically Signed   By: Cordella Banner   On: 03/13/2024 11:38   CT Head Wo Contrast Result Date: 03/13/2024 EXAM: CT HEAD WITHOUT CONTRAST 03/13/2024 01:15:16 AM TECHNIQUE: CT of the head was performed without the administration of intravenous contrast. Automated exposure control, iterative reconstruction, and/or weight based adjustment of the mA/kV was utilized to reduce the radiation dose to as low as reasonably achievable. COMPARISON: 04/24/2023 CLINICAL HISTORY: Confusion and slurred speech. FINDINGS: BRAIN AND VENTRICLES: Chronic white matter ischemic changes are noted. No acute hemorrhage or acute infarct is seen. No extra-axial collection. No mass effect or midline shift. ORBITS: No acute abnormality. SINUSES: No acute abnormality. SOFT TISSUES AND SKULL: No acute soft tissue abnormality. No skull fracture. IMPRESSION: 1. Chronic ischemic changes without acute abnormality. Electronically signed by: Oneil Devonshire MD 03/13/2024 01:21 AM EST RP Workstation: [id number]    Microbiology: Results for orders placed or performed during the hospital encounter of 03/13/24  Urine Culture     Status: None   Collection Time: 03/13/24  9:14 AM   Specimen: Urine, Clean Catch  Result Value Ref Range Status   Specimen Description   Final    URINE, CLEAN CATCH Performed at [facility], [address] [place], [place] [address]    Special Requests   Final    NONE Performed at [facility], [address], [place], [place] [address]    Culture   Final    NO  GROWTH Performed at [facility], [address] [place], [place] [address]    Report Status 03/14/2024 FINAL  Final    Labs: CBC: Recent Labs  Lab 03/13/24 0048  WBC 12.7*  NEUTROABS 7.4  HGB 13.7  HCT 41.9  MCV 88.2  PLT 309   Basic Metabolic Panel: Recent Labs  Lab 03/13/24 0048  NA 133*  K 3.8  CL 98  CO2 20*  GLUCOSE 194*  BUN 13  CREATININE 0.75  CALCIUM  9.1   Liver Function Tests: Recent Labs  Lab 03/13/24 0048  AST 19  ALT 12  ALKPHOS 81  BILITOT <0.2  PROT 6.7  ALBUMIN 4.1   CBG: Recent Labs  Lab 03/13/24 2018 03/13/24 2124 03/13/24 2351 03/14/24 0420 03/14/24 0757  GLUCAP 172* 170* 150* 144* 156*    Discharge time spent: greater than 30 minutes.  Signed: Charlie Patterson, MD [facility] 03/14/2024 "

## 2024-03-14 NOTE — Plan of Care (Signed)
  Problem: Coping: Goal: Ability to adjust to condition or change in health will improve Outcome: Progressing   Problem: Fluid Volume: Goal: Ability to maintain a balanced intake and output will improve Outcome: Progressing   Problem: Metabolic: Goal: Ability to maintain appropriate glucose levels will improve Outcome: Progressing   Problem: Tissue Perfusion: Goal: Adequacy of tissue perfusion will improve Outcome: Progressing   Problem: Clinical Measurements: Goal: Ability to maintain clinical measurements within normal limits will improve Outcome: Progressing

## 2024-03-14 NOTE — Assessment & Plan Note (Signed)
 Patient must cut down on BC powder especially while taking aspirin .  High risk of bleeding while on blood thinners.  Since her issue is mostly sinuses I prescribed Flonase  nasal spray.

## 2024-03-14 NOTE — Plan of Care (Signed)
" °  Problem: Coping: Goal: Ability to adjust to condition or change in health will improve 03/14/2024 0509 by Melven Odilia PARAS, RN Outcome: Progressing 03/14/2024 0426 by Melven Odilia PARAS, RN Outcome: Progressing   Problem: Fluid Volume: Goal: Ability to maintain a balanced intake and output will improve 03/14/2024 0509 by Melven Odilia PARAS, RN Outcome: Progressing 03/14/2024 0426 by Melven Odilia PARAS, RN Outcome: Progressing   Problem: Metabolic: Goal: Ability to maintain appropriate glucose levels will improve 03/14/2024 0509 by Melven Odilia PARAS, RN Outcome: Progressing 03/14/2024 0426 by Melven Odilia PARAS, RN Outcome: Progressing   Problem: Nutritional: Goal: Maintenance of adequate nutrition will improve 03/14/2024 0509 by Melven Odilia PARAS, RN Outcome: Progressing 03/14/2024 0426 by Melven Odilia PARAS, RN Outcome: Progressing   Problem: Tissue Perfusion: Goal: Adequacy of tissue perfusion will improve 03/14/2024 0509 by Melven Odilia PARAS, RN Outcome: Progressing 03/14/2024 0426 by Melven Odilia PARAS, RN Outcome: Progressing   Problem: Skin Integrity: Goal: Risk for impaired skin integrity will decrease 03/14/2024 0509 by Melven Odilia PARAS, RN Outcome: Progressing 03/14/2024 0426 by Melven Odilia PARAS, RN Outcome: Progressing   "

## 2024-03-14 NOTE — Assessment & Plan Note (Signed)
 Careful with pain medications can also cause slurred speech and memory issues.  PCP can address as outpatient.

## 2024-03-14 NOTE — Assessment & Plan Note (Signed)
 Patient ambulated around without any issues and did not need a physical therapy or Occupational Therapy evaluation.  Patient had slurred speech and trouble getting her words out.  Aspirin  and Plavix  for 21 days then aspirin  after that.  Switched her cholesterol medication over to Crestor .  MRI of the brain negative for stroke.  Carotid ultrasound did not show any plaque.  Echocardiogram did not show source of stroke.

## 2024-03-14 NOTE — Discharge Instructions (Addendum)
 Continue aspirin  life long Continue plavix  for 20 more days I switched omeprazole to protonix  (while on plavix ) I switched pravastatin to crestor  Cut back on BC powder and dont take at same time as aspirin 

## 2024-03-14 NOTE — Assessment & Plan Note (Signed)
 Respiratory status stable.  Patient wears nocturnal oxygen.

## 2024-03-14 NOTE — Assessment & Plan Note (Signed)
 Oral supplementation recommended.  This level came back after patient discharge.  Sent off because daughter states that she has had some memory loss.

## 2024-03-14 NOTE — Assessment & Plan Note (Signed)
 On Chantix
